# Patient Record
Sex: Male | Born: 1955
Health system: Southern US, Community
[De-identification: ages and names within clinical notes are randomized; demographics above are authoritative.]

## PROBLEM LIST (undated history)

## (undated) DIAGNOSIS — E7800 Pure hypercholesterolemia, unspecified: Secondary | ICD-10-CM

## (undated) DIAGNOSIS — D126 Benign neoplasm of colon, unspecified: Secondary | ICD-10-CM

## (undated) DIAGNOSIS — I639 Cerebral infarction, unspecified: Secondary | ICD-10-CM

## (undated) DIAGNOSIS — R413 Other amnesia: Secondary | ICD-10-CM

## (undated) DIAGNOSIS — I10 Essential (primary) hypertension: Secondary | ICD-10-CM

## (undated) HISTORY — DX: Essential (primary) hypertension: I10

## (undated) HISTORY — PX: COLONOSCOPY: SHX174

## (undated) HISTORY — DX: Benign neoplasm of colon, unspecified: D12.6

## (undated) HISTORY — DX: Cerebral infarction, unspecified: I63.9

## (undated) HISTORY — DX: Other amnesia: R41.3

## (undated) HISTORY — PX: POLYPECTOMY: SHX149

---

## 2002-12-10 HISTORY — PX: HERNIA REPAIR: SHX51

## 2003-03-16 ENCOUNTER — Ambulatory Visit (HOSPITAL_BASED_OUTPATIENT_CLINIC_OR_DEPARTMENT_OTHER): Admission: RE | Admit: 2003-03-16 | Discharge: 2003-03-16 | Payer: Self-pay

## 2011-04-06 ENCOUNTER — Ambulatory Visit: Payer: Self-pay | Admitting: Family

## 2014-01-11 ENCOUNTER — Ambulatory Visit: Payer: Self-pay | Admitting: Family Medicine

## 2014-01-18 ENCOUNTER — Encounter: Payer: Self-pay | Admitting: Family Medicine

## 2014-01-18 ENCOUNTER — Ambulatory Visit (INDEPENDENT_AMBULATORY_CARE_PROVIDER_SITE_OTHER): Payer: 59 | Admitting: Family Medicine

## 2014-01-18 VITALS — BP 181/109 | HR 76 | Temp 98.0°F | Ht 68.0 in | Wt 159.2 lb

## 2014-01-18 DIAGNOSIS — I10 Essential (primary) hypertension: Secondary | ICD-10-CM

## 2014-01-18 LAB — POCT URINALYSIS DIPSTICK
Bilirubin, UA: NEGATIVE
Blood, UA: NEGATIVE
Glucose, UA: NEGATIVE
Ketones, UA: NEGATIVE
Leukocytes, UA: NEGATIVE
Nitrite, UA: NEGATIVE
PH UA: 8.5
Protein, UA: NEGATIVE
Urobilinogen, UA: 0.2

## 2014-01-18 LAB — CBC WITH DIFFERENTIAL/PLATELET
BASOS ABS: 0 10*3/uL (ref 0.0–0.1)
Basophils Relative: 0.5 % (ref 0.0–3.0)
EOS ABS: 0.1 10*3/uL (ref 0.0–0.7)
Eosinophils Relative: 1.1 % (ref 0.0–5.0)
HCT: 49.4 % — ABNORMAL HIGH (ref 36.0–46.0)
Hemoglobin: 16.4 g/dL — ABNORMAL HIGH (ref 12.0–15.0)
LYMPHS ABS: 1.3 10*3/uL (ref 0.7–4.0)
LYMPHS PCT: 16.4 % (ref 12.0–46.0)
MCHC: 33.2 g/dL (ref 30.0–36.0)
MCV: 95.8 fl (ref 78.0–100.0)
Monocytes Absolute: 0.4 10*3/uL (ref 0.1–1.0)
Monocytes Relative: 5.3 % (ref 3.0–12.0)
Neutro Abs: 6.2 10*3/uL (ref 1.4–7.7)
Neutrophils Relative %: 76.7 % (ref 43.0–77.0)
Platelets: 222 10*3/uL (ref 150.0–400.0)
RBC: 5.15 Mil/uL — ABNORMAL HIGH (ref 3.87–5.11)
RDW: 12.3 % (ref 11.5–14.6)
WBC: 8.1 10*3/uL (ref 4.5–10.5)

## 2014-01-18 LAB — HEPATIC FUNCTION PANEL
ALBUMIN: 4 g/dL (ref 3.5–5.2)
ALK PHOS: 73 U/L (ref 39–117)
ALT: 12 U/L (ref 0–35)
AST: 17 U/L (ref 0–37)
Bilirubin, Direct: 0.1 mg/dL (ref 0.0–0.3)
Total Bilirubin: 0.7 mg/dL (ref 0.3–1.2)
Total Protein: 6.9 g/dL (ref 6.0–8.3)

## 2014-01-18 LAB — BASIC METABOLIC PANEL
BUN: 7 mg/dL (ref 6–23)
CALCIUM: 9.7 mg/dL (ref 8.4–10.5)
CO2: 26 meq/L (ref 19–32)
Chloride: 108 mEq/L (ref 96–112)
Creatinine, Ser: 0.9 mg/dL (ref 0.4–1.2)
GFR: 66.48 mL/min (ref >60.00)
GLUCOSE: 101 mg/dL — AB (ref 70–99)
Potassium: 4.3 mEq/L (ref 3.5–5.1)
Sodium: 145 mEq/L (ref 135–145)

## 2014-01-18 LAB — PSA: PSA: 1.03 ng/mL (ref 0.10–4.00)

## 2014-01-18 LAB — LIPID PANEL
CHOLESTEROL: 187 mg/dL (ref 0–200)
HDL: 40 mg/dL (ref >39.00)
LDL Cholesterol: 131 mg/dL — ABNORMAL HIGH (ref 0–99)
Total CHOL/HDL Ratio: 5
Triglycerides: 82 mg/dL (ref 0.0–149.0)
VLDL: 16.4 mg/dL (ref 0.0–40.0)

## 2014-01-18 LAB — TSH: TSH: 1.22 u[IU]/mL (ref 0.35–5.50)

## 2014-01-18 MED ORDER — LISINOPRIL 20 MG PO TABS: 20.0000 mg | ORAL_TABLET | Freq: Every day | ORAL | Status: DC

## 2014-01-18 NOTE — Patient Instructions (Signed)

## 2014-01-18 NOTE — Progress Notes (Signed)
Pre visit review using our clinic review tool, if applicable. No additional management support is needed unless otherwise documented below in the visit note. 

## 2014-01-18 NOTE — Progress Notes (Signed)
  Subjective:    Patient here for follow-up of elevated blood pressure.  She is not exercising and is adherent to a low-salt diet.  Blood pressure is not well controlled at home. Cardiac symptoms: none. Patient denies: chest pain, chest pressure/discomfort, claudication, dyspnea, exertional chest pressure/discomfort, fatigue, irregular heart beat, lower extremity edema, near-syncope, orthopnea, palpitations, paroxysmal nocturnal dyspnea, syncope and tachypnea. Cardiovascular risk factors: family history of premature cardiovascular disease, hypertension, male gender and sedentary lifestyle. Use of agents associated with hypertension: none. History of target organ damage: none.  The following portions of the patient's history were reviewed and updated as appropriate:  She  has a past medical history of Hypertension. She  does not have any pertinent problems on file. She  has past surgical history that includes Hernia repair (2004). Her family history includes Graves' disease in her mother; Heart disease in her father, mother, paternal uncle, paternal uncle, paternal uncle, paternal uncle, paternal uncle, and paternal uncle; Hyperlipidemia in her brother; Hypertension in her brother and brother. She  has no tobacco, alcohol, and drug history on file. She currently has no medications in their medication list. No current outpatient prescriptions on file prior to visit.   No current facility-administered medications on file prior to visit.   She has No Known Allergies..  Review of Systems Pertinent items are noted in HPI.     Objective:    BP 181/109  Pulse 76  Temp(Src) 98 F (36.7 C) (Oral)  Ht 5\' 8"  (1.727 m)  Wt 159 lb 3.2 oz (72.213 kg)  BMI 24.21 kg/m2  SpO2 96% General appearance: alert, cooperative, appears stated age and no distress Throat: lips, mucosa, and tongue normal; teeth and gums normal Neck: no adenopathy, no carotid bruit, no JVD, supple, symmetrical, trachea midline and  thyroid not enlarged, symmetric, no tenderness/mass/nodules Lungs: clear to auscultation bilaterally Heart: regular rate and rhythm, S1, S2 normal, no murmur, click, rub or gallop Extremities: extremities normal, atraumatic, no cyanosis or edema   EKG-- NSR  Assessment:    Hypertension, stage 2 uncontrolled. Evidence of target organ damage: none.    Plan:    Medication: begin lisinopril. Screening labs for initial evaluation: basic metabolic panel, blood sugar, creatinine, lipid panel, potassium and urinalysis. Dietary sodium restriction. Regular aerobic exercise. Check blood pressures 2-3 times weekly and record. Follow up: 2 weeks and as needed.

## 2014-01-19 ENCOUNTER — Telehealth: Payer: Self-pay | Admitting: Family Medicine

## 2014-01-19 NOTE — Telephone Encounter (Signed)
Relevant patient education assigned to patient using Emmi. ° °

## 2014-02-01 ENCOUNTER — Encounter: Payer: Self-pay | Admitting: Family Medicine

## 2014-02-01 ENCOUNTER — Ambulatory Visit (INDEPENDENT_AMBULATORY_CARE_PROVIDER_SITE_OTHER): Payer: 59 | Admitting: Family Medicine

## 2014-02-01 VITALS — BP 156/88 | HR 85 | Temp 98.4°F | Wt 158.0 lb

## 2014-02-01 DIAGNOSIS — I10 Essential (primary) hypertension: Secondary | ICD-10-CM

## 2014-02-01 MED ORDER — LISINOPRIL-HYDROCHLOROTHIAZIDE 20-12.5 MG PO TABS: 1.0000 | ORAL_TABLET | Freq: Every day | ORAL | Status: DC

## 2014-02-01 NOTE — Progress Notes (Signed)
  Subjective:    Patient here for follow-up of elevated blood pressure.  He is exercising and is adherent to a low-salt diet.  Blood pressure is not well controlled at home. Cardiac symptoms: none. Patient denies: chest pain, chest pressure/discomfort, claudication, dyspnea, exertional chest pressure/discomfort, fatigue, irregular heart beat, lower extremity edema, near-syncope, orthopnea, palpitations, paroxysmal nocturnal dyspnea, syncope and tachypnea. Cardiovascular risk factors: dyslipidemia, hypertension and male gender. Use of agents associated with hypertension: none. History of target organ damage: none.  The following portions of the patient's history were reviewed and updated as appropriate: allergies, current medications, past family history, past medical history, past social history, past surgical history and problem list.  Review of Systems Pertinent items are noted in HPI.     Objective:    BP 156/88  Pulse 85  Temp(Src) 98.4 F (36.9 C) (Oral)  Wt 158 lb (71.668 kg)  SpO2 96% General appearance: alert, cooperative, appears stated age and no distress Lungs: clear to auscultation bilaterally Heart: S1, S2 normal Extremities: extremities normal, atraumatic, no cyanosis or edema    Assessment:    Hypertension, stage 1 . Evidence of target organ damage: none.    Plan:    Medication: increase to lisinopril hct. Dietary sodium restriction. Regular aerobic exercise. Check blood pressures 2-3 times weekly and record. Follow up: 3 weeks and as needed.

## 2014-02-01 NOTE — Patient Instructions (Signed)

## 2014-03-02 ENCOUNTER — Ambulatory Visit: Payer: Self-pay | Admitting: Family Medicine

## 2014-03-04 ENCOUNTER — Telehealth: Payer: Self-pay

## 2014-03-04 NOTE — Telephone Encounter (Signed)
Left message for call back Non-identifiable   Flu-declined [facility] Tdap- 05/21/12 PSA- 01/18/14 1.03

## 2014-03-05 ENCOUNTER — Ambulatory Visit (INDEPENDENT_AMBULATORY_CARE_PROVIDER_SITE_OTHER): Payer: 59 | Admitting: Family Medicine

## 2014-03-05 ENCOUNTER — Encounter: Payer: Self-pay | Admitting: Family Medicine

## 2014-03-05 VITALS — BP 150/86 | HR 74 | Temp 98.4°F | Ht 68.0 in | Wt 154.0 lb

## 2014-03-05 DIAGNOSIS — D485 Neoplasm of uncertain behavior of skin: Secondary | ICD-10-CM

## 2014-03-05 DIAGNOSIS — I10 Essential (primary) hypertension: Secondary | ICD-10-CM

## 2014-03-05 DIAGNOSIS — D229 Melanocytic nevi, unspecified: Secondary | ICD-10-CM

## 2014-03-05 DIAGNOSIS — Z0000 Encounter for general adult medical examination without abnormal findings: Secondary | ICD-10-CM

## 2014-03-05 NOTE — Telephone Encounter (Signed)
Medication List and allergies:  Updated and Reviewed  90 day supply/mail order: n/a Local prescriptions:  [facility] [place], [place].  Immunization due:  Influenza-declined  A/P: No changes to personal, family or PSH Flu-declined Tdap- 05/21/12 [facility]- has never had a colonoscopy PSA- 01/18/14- 1.03   To discuss with provider: Nothing at this time.

## 2014-03-05 NOTE — Patient Instructions (Addendum)
Preventive Care for Adults, Male A healthy lifestyle and preventive care can promote health and wellness. Preventive health guidelines for men include the following key practices:  A routine yearly physical is a good way to check with your health care provider about your health and preventative screening. It is a chance to share any concerns and updates on your health and to receive a thorough exam.  Visit your dentist for a routine exam and preventative care every 6 months. Brush your teeth twice a day and floss once a day. Good oral hygiene prevents tooth decay and gum disease.  The frequency of eye exams is based on your age, health, family medical history, use of contact lenses, and other factors. Follow your health care provider's recommendations for frequency of eye exams.  Eat a healthy diet. Foods such as vegetables, fruits, whole grains, low-fat dairy products, and lean protein foods contain the nutrients you need without too many calories. Decrease your intake of foods high in solid fats, added sugars, and salt. Eat the right amount of calories for you.Get information about a proper diet from your health care provider, if necessary.  Regular physical exercise is one of the most important things you can do for your health. Most adults should get at least 150 minutes of moderate-intensity exercise (any activity that increases your heart rate and causes you to sweat) each week. In addition, most adults need muscle-strengthening exercises on 2 or more days a week.  Maintain a healthy weight. The body mass index (BMI) is a screening tool to identify possible weight problems. It provides an estimate of body fat based on height and weight. Your health care provider can find your BMI and can help you achieve or maintain a healthy weight.For adults 20 years and older:  A BMI below 18.5 is considered underweight.  A BMI of 18.5 to 24.9 is normal.  A BMI of 25 to 29.9 is considered  overweight.  A BMI of 30 and above is considered obese.  Maintain normal blood lipids and cholesterol levels by exercising and minimizing your intake of saturated fat. Eat a balanced diet with plenty of fruit and vegetables. Blood tests for lipids and cholesterol should begin at age 42 and be repeated every 5 years. If your lipid or cholesterol levels are high, you are over 50, or you are at high risk for heart disease, you may need your cholesterol levels checked more frequently.Ongoing high lipid and cholesterol levels should be treated with medicines if diet and exercise are not working.  If you smoke, find out from your health care provider how to quit. If you do not use tobacco, do not start.  Lung cancer screening is recommended for adults aged 24 80 years who are at high risk for developing lung cancer because of a history of smoking. A yearly low-dose CT scan of the lungs is recommended for people who have at least a 30-pack-year history of smoking and are a current smoker or have quit within the past 15 years. A pack year of smoking is smoking an average of 1 pack of cigarettes a day for 1 year (for example: 1 pack a day for 30 years or 2 packs a day for 15 years). Yearly screening should continue until the smoker has stopped smoking for at least 15 years. Yearly screening should be stopped for people who develop a health problem that would prevent them from having lung cancer treatment.  If you choose to drink alcohol, do not have  more than 2 drinks per day. One drink is considered to be 12 ounces (355 mL) of beer, 5 ounces (148 mL) of wine, or 1.5 ounces (44 mL) of liquor.  Avoid use of street drugs. Do not share needles with anyone. Ask for help if you need support or instructions about stopping the use of drugs.  High blood pressure causes heart disease and increases the risk of stroke. Your blood pressure should be checked at least every 1 2 years. Ongoing high blood pressure should be  treated with medicines, if weight loss and exercise are not effective.  If you are 75 59 years old, ask your health care provider if you should take aspirin to prevent heart disease.  Diabetes screening involves taking a blood sample to check your fasting blood sugar level. This should be done once every 3 years, after age 19, if you are within normal weight and without risk factors for diabetes. Testing should be considered at a younger age or be carried out more frequently if you are overweight and have at least 1 risk factor for diabetes.  Colorectal cancer can be detected and often prevented. Most routine colorectal cancer screening begins at the age of 47 and continues through age 80. However, your health care provider may recommend screening at an earlier age if you have risk factors for colon cancer. On a yearly basis, your health care provider may provide home test kits to check for hidden blood in the stool. Use of a small camera at the end of a tube to directly examine the colon (sigmoidoscopy or colonoscopy) can detect the earliest forms of colorectal cancer. Talk to your health care provider about this at age 66, when routine screening begins. Direct exam of the colon should be repeated every 5 10 years through age 19, unless early forms of precancerous polyps or small growths are found.  People who are at an increased risk for hepatitis B should be screened for this virus. You are considered at high risk for hepatitis B if:  You were born in a country where hepatitis B occurs often. Talk with your health care provider about which countries are considered high-risk.  Your parents were born in a high-risk country and you have not received a shot to protect against hepatitis B (hepatitis B vaccine).  You have HIV or AIDS.  You use needles to inject street drugs.  You live with, or have sex with, someone who has hepatitis B.  You are a man who has sex with other men (MSM).  You get  hemodialysis treatment.  You take certain medicines for conditions such as cancer, organ transplantation, and autoimmune conditions.  Hepatitis C blood testing is recommended for all people born from [birth year] through 1965 and any individual with known risks for hepatitis C.  Practice safe sex. Use condoms and avoid high-risk sexual practices to reduce the spread of sexually transmitted infections (STIs). STIs include gonorrhea, chlamydia, syphilis, trichomonas, herpes, HPV, and human immunodeficiency virus (HIV). Herpes, HIV, and HPV are viral illnesses that have no cure. They can result in disability, cancer, and death.  A one-time screening for abdominal aortic aneurysm (AAA) and surgical repair of large AAAs by ultrasound are recommended for men ages 94 to 74 years who are current or former smokers.  Healthy men should no longer receive prostate-specific antigen (PSA) blood tests as part of routine cancer screening. Talk with your health care provider about prostate cancer screening.  Testicular cancer screening is not recommended  for adult males who have no symptoms. Screening includes self-exam, a health care provider exam, and other screening tests. Consult with your health care provider about any symptoms you have or any concerns you have about testicular cancer.  Use sunscreen. Apply sunscreen liberally and repeatedly throughout the day. You should seek shade when your shadow is shorter than you. Protect yourself by wearing long sleeves, pants, a wide-brimmed hat, and sunglasses year round, whenever you are outdoors.  Once a month, do a whole-body skin exam, using a mirror to look at the skin on your back. Tell your health care provider about new moles, moles that have irregular borders, moles that are larger than a pencil eraser, or moles that have changed in shape or color.  Stay current with required vaccines (immunizations).  Influenza vaccine. All adults should be immunized every  year.  Tetanus, diphtheria, and acellular pertussis (Td, Tdap) vaccine. An adult who has not previously received Tdap or who does not know his vaccine status should receive 1 dose of Tdap. This initial dose should be followed by tetanus and diphtheria toxoids (Td) booster doses every 10 years. Adults with an unknown or incomplete history of completing a 3-dose immunization series with Td-containing vaccines should begin or complete a primary immunization series including a Tdap dose. Adults should receive a Td booster every 10 years.  Varicella vaccine. An adult without evidence of immunity to varicella should receive 2 doses or a second dose if he has previously received 1 dose.  Human papillomavirus (HPV) vaccine. Males aged 44 21 years who have not received the vaccine previously should receive the 3-dose series. Males aged 43 26 years may be immunized. Immunization is recommended through the age of 50 years for any male who has sex with males and did not get any or all doses earlier. Immunization is recommended for any person with an immunocompromised condition through the age of 23 years if he did not get any or all doses earlier. During the 3-dose series, the second dose should be obtained 4 8 weeks after the first dose. The third dose should be obtained 24 weeks after the first dose and 16 weeks after the second dose.  Zoster vaccine. One dose is recommended for adults aged 96 years or older unless certain conditions are present.  Measles, mumps, and rubella (MMR) vaccine. Adults born before 55 generally are considered immune to measles and mumps. Adults born in [birth year] or later should have 1 or more doses of MMR vaccine unless there is a contraindication to the vaccine or there is laboratory evidence of immunity to each of the three diseases. A routine second dose of MMR vaccine should be obtained at least 28 days after the first dose for students attending postsecondary schools, health care  workers, or international travelers. People who received inactivated measles vaccine or an unknown type of measles vaccine during 1963 1967 should receive 2 doses of MMR vaccine. People who received inactivated mumps vaccine or an unknown type of mumps vaccine before 1979 and are at high risk for mumps infection should consider immunization with 2 doses of MMR vaccine. Unvaccinated health care workers born before [birth year] who lack laboratory evidence of measles, mumps, or rubella immunity or laboratory confirmation of disease should consider measles and mumps immunization with 2 doses of MMR vaccine or rubella immunization with 1 dose of MMR vaccine.  Pneumococcal 13-valent conjugate (PCV13) vaccine. When indicated, a person who is uncertain of his immunization history and has no record of immunization  should receive the PCV13 vaccine. An adult aged 67 years or older who has certain medical conditions and has not been previously immunized should receive 1 dose of PCV13 vaccine. This PCV13 should be followed with a dose of pneumococcal polysaccharide (PPSV23) vaccine. The PPSV23 vaccine dose should be obtained at least 8 weeks after the dose of PCV13 vaccine. An adult aged 79 years or older who has certain medical conditions and previously received 1 or more doses of PPSV23 vaccine should receive 1 dose of PCV13. The PCV13 vaccine dose should be obtained 1 or more years after the last PPSV23 vaccine dose.  Pneumococcal polysaccharide (PPSV23) vaccine. When PCV13 is also indicated, PCV13 should be obtained first. All adults aged 74 years and older should be immunized. An adult younger than age 50 years who has certain medical conditions should be immunized. Any person who resides in a nursing home or long-term care facility should be immunized. An adult smoker should be immunized. People with an immunocompromised condition and certain other conditions should receive both PCV13 and PPSV23 vaccines. People with human  immunodeficiency virus (HIV) infection should be immunized as soon as possible after diagnosis. Immunization during chemotherapy or radiation therapy should be avoided. Routine use of PPSV23 vaccine is not recommended for American Indians, [place] Natives, or people younger than 65 years unless there are medical conditions that require PPSV23 vaccine. When indicated, people who have unknown immunization and have no record of immunization should receive PPSV23 vaccine. One-time revaccination 5 years after the first dose of PPSV23 is recommended for people aged 41 64 years who have chronic kidney failure, nephrotic syndrome, asplenia, or immunocompromised conditions. People who received 1 2 doses of PPSV23 before age 15 years should receive another dose of PPSV23 vaccine at age 48 years or later if at least 5 years have passed since the previous dose. Doses of PPSV23 are not needed for people immunized with PPSV23 at or after age 69 years.  Meningococcal vaccine. Adults with asplenia or persistent complement component deficiencies should receive 2 doses of quadrivalent meningococcal conjugate (MenACWY-D) vaccine. The doses should be obtained at least 2 months apart. Microbiologists working with certain meningococcal bacteria, [place] recruits, people at risk during an outbreak, and people who travel to or live in countries with a high rate of meningitis should be immunized. A first-year college student up through age 7 years who is living in a residence hall should receive a dose if he did not receive a dose on or after his 16th birthday. Adults who have certain high-risk conditions should receive one or more doses of vaccine.  Hepatitis A vaccine. Adults who wish to be protected from this disease, have certain high-risk conditions, work with hepatitis A-infected animals, work in hepatitis A research labs, or travel to or work in countries with a high rate of hepatitis A should be immunized. Adults who were  previously unvaccinated and who anticipate close contact with an international adoptee during the first 60 days after arrival in the [place] from a country with a high rate of hepatitis A should be immunized.  Hepatitis B vaccine. Adults who wish to be protected from this disease, have certain high-risk conditions, may be exposed to blood or other infectious body fluids, are household contacts or sex partners of hepatitis B positive people, are clients or workers in certain care facilities, or travel to or work in countries with a high rate of hepatitis B should be immunized.  Haemophilus influenzae type b (Hib) vaccine. A  previously unvaccinated person with asplenia or sickle cell disease or having a scheduled splenectomy should receive 1 dose of Hib vaccine. Regardless of previous immunization, a recipient of a hematopoietic stem cell transplant should receive a 3-dose series 6 12 months after his successful transplant. Hib vaccine is not recommended for adults with HIV infection. Preventive Service / Frequency Ages 62 to 3  Blood pressure check.** / Every 1 to 2 years.  Lipid and cholesterol check.** / Every 5 years beginning at age 43.  Hepatitis C blood test.** / For any individual with known risks for hepatitis C.  Skin self-exam. / Monthly.  Influenza vaccine. / Every year.  Tetanus, diphtheria, and acellular pertussis (Tdap, Td) vaccine.** / Consult your health care provider. 1 dose of Td every 10 years.  Varicella vaccine.** / Consult your health care provider.  HPV vaccine. / 3 doses over 6 months, if 48 or younger.  Measles, mumps, rubella (MMR) vaccine.** / You need at least 1 dose of MMR if you were born in 1957 or later. You may also need a second dose.  Pneumococcal 13-valent conjugate (PCV13) vaccine.** / Consult your health care provider.  Pneumococcal polysaccharide (PPSV23) vaccine.** / 1 to 2 doses if you smoke cigarettes or if you have certain  conditions.  Meningococcal vaccine.** / 1 dose if you are age 8 to 70 years and a Market researcher living in a residence hall, or have one of several medical conditions. You may also need additional booster doses.  Hepatitis A vaccine.** / Consult your health care provider.  Hepatitis B vaccine.** / Consult your health care provider.  Haemophilus influenzae type b (Hib) vaccine.** / Consult your health care provider. Ages 48 to 32  Blood pressure check.** / Every 1 to 2 years.  Lipid and cholesterol check.** / Every 5 years beginning at age 38.  Lung cancer screening. / Every year if you are aged 40 80 years and have a 30-pack-year history of smoking and currently smoke or have quit within the past 15 years. Yearly screening is stopped once you have quit smoking for at least 15 years or develop a health problem that would prevent you from having lung cancer treatment.  Fecal occult blood test (FOBT) of stool. / Every year beginning at age 4 and continuing until age 70. You may not have to do this test if you get a colonoscopy every 10 years.  Flexible sigmoidoscopy** or colonoscopy.** / Every 5 years for a flexible sigmoidoscopy or every 10 years for a colonoscopy beginning at age 76 and continuing until age 62.  Hepatitis C blood test.** / For all people born from [birth year] through 1965 and any individual with known risks for hepatitis C.  Skin self-exam. / Monthly.  Influenza vaccine. / Every year.  Tetanus, diphtheria, and acellular pertussis (Tdap/Td) vaccine.** / Consult your health care provider. 1 dose of Td every 10 years.  Varicella vaccine.** / Consult your health care provider.  Zoster vaccine.** / 1 dose for adults aged 60 years or older.  Measles, mumps, rubella (MMR) vaccine.** / You need at least 1 dose of MMR if you were born in 1958 or later. You may also need a second dose.  Pneumococcal 13-valent conjugate (PCV13) vaccine.** / Consult your health care  provider.  Pneumococcal polysaccharide (PPSV23) vaccine.** / 1 to 2 doses if you smoke cigarettes or if you have certain conditions.  Meningococcal vaccine.** / Consult your health care provider.  Hepatitis A vaccine.** / Consult your health care  provider.  Hepatitis B vaccine.** / Consult your health care provider.  Haemophilus influenzae type b (Hib) vaccine.** / Consult your health care provider. Ages 65 and over  Blood pressure check.** / Every 1 to 2 years.  Lipid and cholesterol check.**/ Every 5 years beginning at age 20.  Lung cancer screening. / Every year if you are aged 55 80 years and have a 30-pack-year history of smoking and currently smoke or have quit within the past 15 years. Yearly screening is stopped once you have quit smoking for at least 15 years or develop a health problem that would prevent you from having lung cancer treatment.  Fecal occult blood test (FOBT) of stool. / Every year beginning at age 50 and continuing until age 75. You may not have to do this test if you get a colonoscopy every 10 years.  Flexible sigmoidoscopy** or colonoscopy.** / Every 5 years for a flexible sigmoidoscopy or every 10 years for a colonoscopy beginning at age 50 and continuing until age 75.  Hepatitis C blood test.** / For all people born from 1945 through 1965 and any individual with known risks for hepatitis C.  Abdominal aortic aneurysm (AAA) screening.** / A one-time screening for ages 65 to 75 years who are current or former smokers.  Skin self-exam. / Monthly.  Influenza vaccine. / Every year.  Tetanus, diphtheria, and acellular pertussis (Tdap/Td) vaccine.** / 1 dose of Td every 10 years.  Varicella vaccine.** / Consult your health care provider.  Zoster vaccine.** / 1 dose for adults aged 60 years or older.  Pneumococcal 13-valent conjugate (PCV13) vaccine.** / Consult your health care provider.  Pneumococcal polysaccharide (PPSV23) vaccine.** / 1 dose for all  adults aged 65 years and older.  Meningococcal vaccine.** / Consult your health care provider.  Hepatitis A vaccine.** / Consult your health care provider.  Hepatitis B vaccine.** / Consult your health care provider.  Haemophilus influenzae type b (Hib) vaccine.** / Consult your health care provider. **Family history and personal history of risk and conditions may change your health care provider's recommendations. Document Released: 01/22/2002 Document Revised: 09/16/2013 Document Reviewed: 04/23/2011 [employer] Patient Information 2014 [employer].  

## 2014-03-05 NOTE — Progress Notes (Signed)
Subjective:    Patient ID: Drew [name], male    DOB: 1956-03-23, 58 y.o.   MRN: [id number]  HPI Pt here for cpe and labs.  Pt is exercising more.  No complaints.      Past Medical History  Diagnosis Date  . Hypertension    History   Social History  . Marital Status: Married    Spouse Name: N/A    Number of Children: N/A  . Years of Education: N/A   Occupational History  . Not on file.   Social History Main Topics  . Smoking status: Former Smoker -- 1.00 packs/day for 15 years    Types: Cigarettes    Quit date: 01/19/1988  . Smokeless tobacco: Current User    Types: Chew  . Alcohol Use: No  . Drug Use: Not on file  . Sexual Activity: Yes   Other Topics Concern  . Not on file   Social History Narrative  . No narrative on file   Family History  Problem Relation Age of Onset  . Heart disease Mother     Duanne Limerick  . Berenice Primas' disease Mother   . Heart disease Father   . Hyperlipidemia Brother   . Hypertension Brother   . Heart disease Paternal Uncle   . Heart disease Paternal Uncle   . Heart disease Paternal Uncle   . Heart disease Paternal Uncle   . Heart disease Paternal Uncle   . Heart disease Paternal Uncle   . Hypertension Brother    Current Outpatient Prescriptions  Medication Sig Dispense Refill  . lisinopril-hydrochlorothiazide (ZESTORETIC) 20-12.5 MG per tablet Take 1 tablet by mouth daily.  30 tablet  2   No current facility-administered medications for this visit.       Review of Systems  Constitutional: Negative.   HENT: Negative for congestion, ear pain, hearing loss, nosebleeds, postnasal drip, rhinorrhea, sinus pressure, sneezing and tinnitus.   Eyes: Negative for photophobia, discharge, itching and visual disturbance.  Respiratory: Negative.   Cardiovascular: Negative.   Gastrointestinal: Negative for abdominal pain, constipation, blood in stool, abdominal distention and anal bleeding.  Endocrine: Negative.   Genitourinary: Negative.     Musculoskeletal: Negative.   Skin: Negative.   Allergic/Immunologic: Negative.   Neurological: Negative for dizziness, weakness, light-headedness, numbness and headaches.  Psychiatric/Behavioral: Negative for suicidal ideas, confusion, sleep disturbance, dysphoric mood, decreased concentration and agitation. The patient is not nervous/anxious.       .vitals 9  Objective:   Physical Exam  Constitutional: He is oriented to person, place, and time. He appears well-developed and well-nourished. No distress.  HENT:  Head: Normocephalic and atraumatic.  Right Ear: External ear normal.  Left Ear: External ear normal.  Nose: Nose normal.  Mouth/Throat: Oropharynx is clear and moist. No oropharyngeal exudate.  Eyes: Conjunctivae and EOM are normal. Pupils are equal, round, and reactive to light. Right eye exhibits no discharge. Left eye exhibits no discharge.  Neck: Normal range of motion. Neck supple. No JVD present. No thyromegaly present.  Cardiovascular: Normal rate, regular rhythm and intact distal pulses.  Exam reveals no gallop and no friction rub.   No murmur heard. Pulmonary/Chest: Effort normal and breath sounds normal. No respiratory distress. He has no wheezes. He has no rales. He exhibits no tenderness.  Abdominal: Soft. Bowel sounds are normal. He exhibits no distension and no mass. There is no tenderness. There is no rebound and no guarding.  Genitourinary: Rectum normal, prostate normal and penis normal. Guaiac negative  stool.  Musculoskeletal: Normal range of motion. He exhibits no edema and no tenderness.  Lymphadenopathy:    He has no cervical adenopathy.  Neurological: He is alert and oriented to person, place, and time. He displays normal reflexes. He exhibits normal muscle tone.  Skin: Skin is warm and dry. No rash noted. He is not diaphoretic. No erythema. No pallor.  Psychiatric: He has a normal mood and affect. His behavior is normal. Judgment and thought content  normal.          Assessment & Plan:  1. Preventative health care ghm utd Check labs - Ambulatory referral to Gastroenterology - Basic metabolic panel; Future - Hepatic function panel; Future - Hemoglobin A1c; Future - Lipid panel; Future  2. Suspicious nevus On L temple - Ambulatory referral to Dermatology

## 2014-03-05 NOTE — Progress Notes (Signed)
Pre visit review using our clinic review tool, if applicable. No additional management support is needed unless otherwise documented below in the visit note. 

## 2014-03-06 NOTE — Assessment & Plan Note (Signed)
con't meds 

## 2014-03-08 ENCOUNTER — Encounter: Payer: Self-pay | Admitting: Internal Medicine

## 2014-03-08 ENCOUNTER — Telehealth: Payer: Self-pay | Admitting: Family Medicine

## 2014-03-08 NOTE — Telephone Encounter (Signed)
Error. BC °

## 2014-04-22 ENCOUNTER — Ambulatory Visit (AMBULATORY_SURGERY_CENTER): Payer: Self-pay | Admitting: *Deleted

## 2014-04-22 VITALS — Ht 66.0 in | Wt 152.2 lb

## 2014-04-22 DIAGNOSIS — Z1211 Encounter for screening for malignant neoplasm of colon: Secondary | ICD-10-CM

## 2014-04-22 MED ORDER — MOVIPREP 100 G PO SOLR: ORAL | Status: DC

## 2014-04-22 NOTE — Progress Notes (Signed)
No allergies to eggs or soy. No problems with anesthesia.  Pt given Emmi instructions for colonoscopy  No oxygen use  No diet drug use  

## 2014-04-27 ENCOUNTER — Other Ambulatory Visit: Payer: Self-pay | Admitting: Family Medicine

## 2014-04-28 ENCOUNTER — Encounter: Payer: Self-pay | Admitting: Internal Medicine

## 2014-05-07 ENCOUNTER — Ambulatory Visit (AMBULATORY_SURGERY_CENTER): Payer: 59 | Admitting: Internal Medicine

## 2014-05-07 ENCOUNTER — Encounter: Payer: Self-pay | Admitting: Internal Medicine

## 2014-05-07 VITALS — BP 148/83 | HR 53 | Temp 97.2°F | Resp 20 | Ht 66.0 in | Wt 152.0 lb

## 2014-05-07 DIAGNOSIS — D126 Benign neoplasm of colon, unspecified: Secondary | ICD-10-CM

## 2014-05-07 DIAGNOSIS — Z1211 Encounter for screening for malignant neoplasm of colon: Secondary | ICD-10-CM

## 2014-05-07 MED ORDER — SODIUM CHLORIDE 0.9 % IV SOLN: 500.0000 mL | INTRAVENOUS | Status: DC

## 2014-05-07 NOTE — Progress Notes (Signed)
Called to room to assist during endoscopic procedure.  Patient ID and intended procedure confirmed with present staff. Received instructions for my participation in the procedure from the performing physician.  

## 2014-05-07 NOTE — Op Note (Signed)
[facility]  [address]. [place], [address]   COLONOSCOPY PROCEDURE REPORT  PATIENT: Drew [name], Drew [name].  MR#: [id number] BIRTHDATE: Feb 07, 1956 , 57  yrs. old GENDER: Male ENDOSCOPIST: Jerene Bears, MD REFERRED [id number] [place], DO PROCEDURE DATE:  05/07/2014 PROCEDURE:   Colonoscopy with snare polypectomy First Screening Colonoscopy - Avg.  risk and is 50 yrs.  old or older Yes.  Prior Negative Screening - Now for repeat screening. N/A  History of Adenoma - Now for follow-up colonoscopy & has been > or = to 3 yrs.  N/A  Polyps Removed Today? Yes. ASA CLASS:   Class II INDICATIONS:average risk screening and first colonoscopy. MEDICATIONS: MAC sedation, administered by CRNA and propofol (Diprivan) 200mg  IV  DESCRIPTION OF PROCEDURE:   After the risks benefits and alternatives of the procedure were thoroughly explained, informed consent was obtained.  A digital rectal exam revealed no rectal mass.   The LB [id number]  endoscope was introduced through the anus and advanced to the cecum, which was identified by both the appendix and ileocecal valve. No adverse events experienced. The quality of the prep was good, using MoviPrep  The instrument was then slowly withdrawn as the colon was fully examined.   COLON FINDINGS: Three sessile polyps measuring 3-6 mm in size were found in the ascending colon (2) and transverse colon.  Polypectomy was performed using cold snare.  All resections were complete and all polyp tissue was completely retrieved.   Mild diverticulosis was noted in the sigmoid colon.  Retroflexed views revealed no abnormalities. The time to cecum=5 minutes 58 seconds.  Withdrawal time=11 minutes 23 seconds.  The scope was withdrawn and the procedure completed. COMPLICATIONS: There were no complications.  ENDOSCOPIC IMPRESSION: 1.   Three sessile polyps measuring 3-6 mm in size were found in the ascending colon and transverse colon; Polypectomy  was performed using cold snare 2.   Mild diverticulosis was noted in the sigmoid colon  RECOMMENDATIONS: 1.  Await pathology results 2.  High fiber diet 3.  Timing of repeat colonoscopy will be determined by pathology findings. 4.  You will receive a letter within 1-2 weeks with the results of your biopsy as well as final recommendations.  Please call my office if you have not received a letter after 3 weeks.   eSigned:  Jerene Bears, MD 05/07/2014 9:35 AM cc: The Patient and Rosalita Chessman, DO

## 2014-05-07 NOTE — Progress Notes (Signed)
A/ox3 pleased with MAC, report to Kristin RN 

## 2014-05-07 NOTE — Patient Instructions (Signed)

## 2014-05-10 ENCOUNTER — Telehealth: Payer: Self-pay | Admitting: *Deleted

## 2014-05-10 NOTE — Telephone Encounter (Signed)
  Follow up Call-  Call back number 05/07/2014  Post procedure Call Back phone  # [phone]  Permission to leave phone message Yes     Patient questions:  Do you have a fever, pain , or abdominal swelling? no Pain Score  0 *  Have you tolerated food without any problems? yes  Have you been able to return to your normal activities? yes  Do you have any questions about your discharge instructions: Diet   no Medications  no Follow up visit  no  Do you have questions or concerns about your Care? no  Actions: * If pain score is 4 or above: No action needed, pain <4.

## 2014-05-13 ENCOUNTER — Encounter: Payer: Self-pay | Admitting: Internal Medicine

## 2014-05-28 ENCOUNTER — Encounter: Payer: Self-pay | Admitting: Family Medicine

## 2014-05-28 ENCOUNTER — Ambulatory Visit (INDEPENDENT_AMBULATORY_CARE_PROVIDER_SITE_OTHER): Payer: 59 | Admitting: Family Medicine

## 2014-05-28 VITALS — BP 140/86 | HR 69 | Temp 98.5°F | Wt 146.0 lb

## 2014-05-28 DIAGNOSIS — E785 Hyperlipidemia, unspecified: Secondary | ICD-10-CM

## 2014-05-28 DIAGNOSIS — Z0000 Encounter for general adult medical examination without abnormal findings: Secondary | ICD-10-CM

## 2014-05-28 DIAGNOSIS — I10 Essential (primary) hypertension: Secondary | ICD-10-CM

## 2014-05-28 LAB — HEPATIC FUNCTION PANEL
ALK PHOS: 61 U/L (ref 39–117)
ALT: 11 U/L (ref 0–53)
AST: 15 U/L (ref 0–37)
Albumin: 3.9 g/dL (ref 3.5–5.2)
Bilirubin, Direct: 0.1 mg/dL (ref 0.0–0.3)
TOTAL PROTEIN: 6.7 g/dL (ref 6.0–8.3)
Total Bilirubin: 0.8 mg/dL (ref 0.2–1.2)

## 2014-05-28 LAB — LIPID PANEL
CHOL/HDL RATIO: 4
Cholesterol: 180 mg/dL (ref 0–200)
HDL: 44.7 mg/dL (ref >39.00)
LDL Cholesterol: 124 mg/dL — ABNORMAL HIGH (ref 0–99)
NONHDL: 135.3
Triglycerides: 55 mg/dL (ref 0.0–149.0)
VLDL: 11 mg/dL (ref 0.0–40.0)

## 2014-05-28 LAB — BASIC METABOLIC PANEL
BUN: 6 mg/dL (ref 6–23)
CO2: 30 meq/L (ref 19–32)
Calcium: 9.4 mg/dL (ref 8.4–10.5)
Chloride: 103 mEq/L (ref 96–112)
Creatinine, Ser: 0.8 mg/dL (ref 0.4–1.5)
GFR: 99.39 mL/min (ref >60.00)
GLUCOSE: 90 mg/dL (ref 70–99)
POTASSIUM: 3.7 meq/L (ref 3.5–5.1)
SODIUM: 138 meq/L (ref 135–145)

## 2014-05-28 LAB — HEMOGLOBIN A1C: Hgb A1c MFr Bld: 5.5 % (ref 4.6–6.5)

## 2014-05-28 MED ORDER — LISINOPRIL-HYDROCHLOROTHIAZIDE 20-12.5 MG PO TABS: 2.0000 | ORAL_TABLET | Freq: Every day | ORAL | Status: DC

## 2014-05-28 NOTE — Addendum Note (Signed)
Addended by: Modena Morrow D on: 05/28/2014 09:52 AM   Modules accepted: Orders

## 2014-05-28 NOTE — Patient Instructions (Signed)
Hypertension Hypertension, commonly called high blood pressure, is when the force of blood pumping through your arteries is too strong. Your arteries are the blood vessels that carry blood from your heart throughout your body. A blood pressure reading consists of a higher number over a lower number, such as 110/72. The higher number (systolic) is the pressure inside your arteries when your heart pumps. The lower number (diastolic) is the pressure inside your arteries when your heart relaxes. Ideally you want your blood pressure below 120/80. Hypertension forces your heart to work harder to pump blood. Your arteries may become narrow or stiff. Having hypertension puts you at risk for heart disease, stroke, and other problems.  RISK FACTORS Some risk factors for high blood pressure are controllable. Others are not.  Risk factors you cannot control include:   Race. You may be at higher risk if you are African American.  Age. Risk increases with age.  Gender. Men are at higher risk than women before age 45 years. After age 65, women are at higher risk than men. Risk factors you can control include:  Not getting enough exercise or physical activity.  Being overweight.  Getting too much fat, sugar, calories, or salt in your diet.  Drinking too much alcohol. SIGNS AND SYMPTOMS Hypertension does not usually cause signs or symptoms. Extremely high blood pressure (hypertensive crisis) may cause headache, anxiety, shortness of breath, and nosebleed. DIAGNOSIS  To check if you have hypertension, your health care provider will measure your blood pressure while you are seated, with your arm held at the level of your heart. It should be measured at least twice using the same arm. Certain conditions can cause a difference in blood pressure between your right and left arms. A blood pressure reading that is higher than normal on one occasion does not mean that you need treatment. If one blood pressure reading  is high, ask your health care provider about having it checked again. TREATMENT  Treating high blood pressure includes making lifestyle changes and possibly taking medication. Living a healthy lifestyle can help lower high blood pressure. You may need to change some of your habits. Lifestyle changes may include:  Following the DASH diet. This diet is high in fruits, vegetables, and whole grains. It is low in salt, red meat, and added sugars.  Getting at least 2 1/2 hours of brisk physical activity every week.  Losing weight if necessary.  Not smoking.  Limiting alcoholic beverages.  Learning ways to reduce stress. If lifestyle changes are not enough to get your blood pressure under control, your health care provider may prescribe medicine. You may need to take more than one. Work closely with your health care provider to understand the risks and benefits. HOME CARE INSTRUCTIONS  Have your blood pressure rechecked as directed by your health care provider.   Only take medicine as directed by your health care provider. Follow the directions carefully. Blood pressure medicines must be taken as prescribed. The medicine does not work as well when you skip doses. Skipping doses also puts you at risk for problems.   Do not smoke.   Monitor your blood pressure at home as directed by your health care provider. SEEK MEDICAL CARE IF:   You think you are having a reaction to medicines taken.  You have recurrent headaches or feel dizzy.  You have swelling in your ankles.  You have trouble with your vision. SEEK IMMEDIATE MEDICAL CARE IF:  You develop a severe headache or   confusion.  You have unusual weakness, numbness, or feel faint.  You have severe chest or abdominal pain.  You vomit repeatedly.  You have trouble breathing. MAKE SURE YOU:   Understand these instructions.  Will watch your condition.  Will get help right away if you are not doing well or get  worse. Document Released: 11/26/2005 Document Revised: 12/01/2013 Document Reviewed: 09/18/2013 ExitCare Patient Information 2015 [employer]. This information is not intended to replace advice given to you by your health care provider. Make sure you discuss any questions you have with your health care provider.  

## 2014-05-28 NOTE — Progress Notes (Signed)
  Subjective:    Patient here for follow-up of elevated blood pressure.  He is exercising and is adherent to a low-salt diet.  Blood pressure is not well controlled at home. Cardiac symptoms: none. Patient denies: chest pain, chest pressure/discomfort, claudication, dyspnea, exertional chest pressure/discomfort, fatigue, irregular heart beat, lower extremity edema, near-syncope, orthopnea, palpitations, paroxysmal nocturnal dyspnea, syncope and tachypnea. Cardiovascular risk factors: advanced age (older than 52 for men, 46 for women), dyslipidemia, hypertension and male gender. Use of agents associated with hypertension: none. History of target organ damage: none.  The following portions of the patient's history were reviewed and updated as appropriate: allergies, current medications, past family history, past medical history, past social history, past surgical history and problem list.  Review of Systems Pertinent items are noted in HPI.     Objective:    BP 140/86  Pulse 69  Temp(Src) 98.5 F (36.9 C) (Oral)  Wt 146 lb (66.225 kg)  SpO2 97% General appearance: alert, cooperative, appears stated age and no distress Neck: no adenopathy, supple, symmetrical, trachea midline and thyroid not enlarged, symmetric, no tenderness/mass/nodules Lungs: clear to auscultation bilaterally Heart: S1, S2 normal Extremities: extremities normal, atraumatic, no cyanosis or edema    Assessment:    Hypertension, uncontrolled. Evidence of target organ damage: none.    Plan:    Medication: increase to lisinopril 20/12.5  2 po qd . Dietary sodium restriction. Regular aerobic exercise. Check blood pressures 2-3 times weekly and record. Follow up: 3 months and as needed.   1. Essential hypertension  - lisinopril-hydrochlorothiazide (ZESTORETIC) 20-12.5 MG per tablet; Take 2 tablets by mouth daily.  Dispense: 180 tablet; Refill: 3  2. Hyperlipidemia Check labs

## 2014-05-28 NOTE — Progress Notes (Signed)
Pre visit review using our clinic review tool, if applicable. No additional management support is needed unless otherwise documented below in the visit note. 

## 2014-06-01 ENCOUNTER — Other Ambulatory Visit: Payer: Self-pay

## 2014-06-01 MED ORDER — ATORVASTATIN CALCIUM 20 MG PO TABS: 20.0000 mg | ORAL_TABLET | Freq: Every day | ORAL | Status: DC

## 2014-06-04 ENCOUNTER — Telehealth: Payer: Self-pay | Admitting: Family Medicine

## 2014-06-04 NOTE — Telephone Encounter (Signed)
Caller name: Hilda Blades  Relation to pt: patient Call back number: [phone] Pharmacy:  Reason for call:  Wife called and stated that she does not know why Lipitor was called in for her husband. Wife thought his recent cholesterol results were good. Please advise.

## 2014-06-04 NOTE — Telephone Encounter (Signed)
Spoke with wife and advised the elevated LDL was the reason for the increase in medication. She voiced understanding and has agreed to have him start medications.     KP   Cholesterol--- LDL goal < 100, HDL >40, TG < 150. Diet and exercise will increase HDL and decrease LDL and TG. Fish, Fish Oil, Flaxseed oil will also help increase the HDL and decrease Triglycerides. Recheck labs in 3 months-----272.4 lipid, hep Lipitor 20 mg #30 1 po qhs

## 2014-09-07 ENCOUNTER — Telehealth: Payer: Self-pay | Admitting: Family Medicine

## 2014-09-07 ENCOUNTER — Other Ambulatory Visit: Payer: Self-pay

## 2014-09-07 DIAGNOSIS — E785 Hyperlipidemia, unspecified: Secondary | ICD-10-CM

## 2014-09-07 MED ORDER — ATORVASTATIN CALCIUM 20 MG PO TABS: 20.0000 mg | ORAL_TABLET | Freq: Every day | ORAL | Status: DC

## 2014-09-07 NOTE — Telephone Encounter (Signed)
Patient is due for labs. Orders are in, please schedule. Rx sent.     KP

## 2014-09-07 NOTE — Telephone Encounter (Signed)
Caller name: Hilda Blades Relation to pt: wife Call back [phone] Pharmacy: [facility]  Reason for call: pt needs refill on Rx atorvastatin (LIPITOR) 20 MG tablet ; pharmacy states there are no more refills

## 2014-09-17 ENCOUNTER — Other Ambulatory Visit (INDEPENDENT_AMBULATORY_CARE_PROVIDER_SITE_OTHER): Payer: 59

## 2014-09-17 DIAGNOSIS — E785 Hyperlipidemia, unspecified: Secondary | ICD-10-CM

## 2014-09-17 LAB — HEPATIC FUNCTION PANEL
ALT: 17 U/L (ref 0–53)
AST: 21 U/L (ref 0–37)
Albumin: 3.6 g/dL (ref 3.5–5.2)
Alkaline Phosphatase: 61 U/L (ref 39–117)
Bilirubin, Direct: 0.1 mg/dL (ref 0.0–0.3)
Total Bilirubin: 1.1 mg/dL (ref 0.2–1.2)
Total Protein: 7.1 g/dL (ref 6.0–8.3)

## 2014-09-17 LAB — LIPID PANEL
CHOLESTEROL: 128 mg/dL (ref 0–200)
HDL: 41.1 mg/dL (ref >39.00)
LDL CALC: 75 mg/dL (ref 0–99)
NonHDL: 86.9
Total CHOL/HDL Ratio: 3
Triglycerides: 59 mg/dL (ref 0.0–149.0)
VLDL: 11.8 mg/dL (ref 0.0–40.0)

## 2014-11-10 ENCOUNTER — Telehealth: Payer: Self-pay | Admitting: Family Medicine

## 2014-11-10 NOTE — Telephone Encounter (Signed)
Caller name:Denomme Deborah Relation to pt: spouse Call back [facility]  Reason for call: pt's spouse states pharmacy has requested rx twice and has not heard anything. Pt is need rx atorvastatin (LIPITOR) 20 MG tablet

## 2014-11-11 MED ORDER — ATORVASTATIN CALCIUM 20 MG PO TABS: 20.0000 mg | ORAL_TABLET | Freq: Every day | ORAL | Status: DC

## 2014-11-11 NOTE — Telephone Encounter (Signed)
Rx faxed.    KP 

## 2014-11-19 ENCOUNTER — Encounter: Payer: Self-pay | Admitting: Family Medicine

## 2014-11-19 ENCOUNTER — Ambulatory Visit (INDEPENDENT_AMBULATORY_CARE_PROVIDER_SITE_OTHER): Payer: 59 | Admitting: Family Medicine

## 2014-11-19 VITALS — BP 128/88 | HR 65 | Temp 98.1°F | Wt 147.8 lb

## 2014-11-19 DIAGNOSIS — E785 Hyperlipidemia, unspecified: Secondary | ICD-10-CM

## 2014-11-19 DIAGNOSIS — I10 Essential (primary) hypertension: Secondary | ICD-10-CM

## 2014-11-19 LAB — LIPID PANEL
CHOL/HDL RATIO: 3
Cholesterol: 129 mg/dL (ref 0–200)
HDL: 43.9 mg/dL (ref >39.00)
LDL CALC: 77 mg/dL (ref 0–99)
NONHDL: 85.1
Triglycerides: 42 mg/dL (ref 0.0–149.0)
VLDL: 8.4 mg/dL (ref 0.0–40.0)

## 2014-11-19 LAB — HEPATIC FUNCTION PANEL
ALT: 15 U/L (ref 0–53)
AST: 20 U/L (ref 0–37)
Albumin: 3.8 g/dL (ref 3.5–5.2)
Alkaline Phosphatase: 67 U/L (ref 39–117)
BILIRUBIN DIRECT: 0.2 mg/dL (ref 0.0–0.3)
BILIRUBIN TOTAL: 1.1 mg/dL (ref 0.2–1.2)
Total Protein: 6.5 g/dL (ref 6.0–8.3)

## 2014-11-19 LAB — BASIC METABOLIC PANEL
BUN: 7 mg/dL (ref 6–23)
CALCIUM: 9.3 mg/dL (ref 8.4–10.5)
CO2: 30 mEq/L (ref 19–32)
Chloride: 104 mEq/L (ref 96–112)
Creatinine, Ser: 0.8 mg/dL (ref 0.4–1.5)
GFR: 109.71 mL/min (ref >60.00)
Glucose, Bld: 83 mg/dL (ref 70–99)
Potassium: 3.8 mEq/L (ref 3.5–5.1)
SODIUM: 137 meq/L (ref 135–145)

## 2014-11-19 MED ORDER — LISINOPRIL-HYDROCHLOROTHIAZIDE 20-12.5 MG PO TABS: 2.0000 | ORAL_TABLET | Freq: Every day | ORAL | Status: DC

## 2014-11-19 NOTE — Patient Instructions (Signed)

## 2014-11-19 NOTE — Progress Notes (Signed)
  Subjective:    Patient here for follow-up of elevated blood pressure.  He is exercising and is adherent to a low-salt diet.  Blood pressure is well controlled at home. Cardiac symptoms: none. Patient denies: chest pain, chest pressure/discomfort, claudication, dyspnea, exertional chest pressure/discomfort, fatigue, irregular heart beat, lower extremity edema, near-syncope, orthopnea, palpitations, paroxysmal nocturnal dyspnea, syncope and tachypnea. Cardiovascular risk factors: dyslipidemia, hypertension and male gender. Use of agents associated with hypertension: none. History of target organ damage: none.  The following portions of the patient's history were reviewed and updated as appropriate: allergies, current medications, past family history, past medical history, past social history, past surgical history and problem list.  Review of Systems Pertinent items are noted in HPI.     Objective:    BP 128/88 mmHg  Pulse 65  Temp(Src) 98.1 F (36.7 C) (Oral)  Wt 147 lb 12.8 oz (67.042 kg)  SpO2 99% General appearance: alert, cooperative, appears stated age and no distress Throat: lips, mucosa, and tongue normal; teeth and gums normal Neck: no adenopathy, no carotid bruit, no JVD, supple, symmetrical, trachea midline and thyroid not enlarged, symmetric, no tenderness/mass/nodules Lungs: clear to auscultation bilaterally Heart: S1, S2 normal Extremities: extremities normal, atraumatic, no cyanosis or edema    Assessment:    Hypertension, normal blood pressure . Evidence of target organ damage: none.    Plan:    Medication: no change. Dietary sodium restriction. Regular aerobic exercise. Follow up: 6 months and as needed.    1. Hyperlipidemia Check labs con't meds - Hepatic function panel - Lipid panel  2. Essential hypertension   - Basic metabolic panel - lisinopril-hydrochlorothiazide (ZESTORETIC) 20-12.5 MG per tablet; Take 2 tablets by mouth daily.  Dispense: 180  tablet; Refill: 3

## 2014-11-19 NOTE — Progress Notes (Signed)
Pre visit review using our clinic review tool, if applicable. No additional management support is needed unless otherwise documented below in the visit note. 

## 2015-05-09 ENCOUNTER — Other Ambulatory Visit: Payer: Self-pay | Admitting: Family Medicine

## 2015-05-10 NOTE — Telephone Encounter (Signed)
Atorvastatin refill sent to pharmacy. Pt is due for office visit.  Please call pt to arrange appt. Thanks!

## 2015-05-11 NOTE — Telephone Encounter (Signed)
lvm advising pt to schedule appointment

## 2015-05-11 NOTE — Telephone Encounter (Signed)
Sent mychart message to pt. 

## 2015-05-31 ENCOUNTER — Ambulatory Visit (INDEPENDENT_AMBULATORY_CARE_PROVIDER_SITE_OTHER): Payer: 59 | Admitting: Family Medicine

## 2015-05-31 ENCOUNTER — Encounter: Payer: Self-pay | Admitting: Family Medicine

## 2015-05-31 VITALS — BP 126/80 | HR 79 | Temp 97.6°F | Resp 18 | Ht 66.0 in | Wt 151.0 lb

## 2015-05-31 DIAGNOSIS — Z23 Encounter for immunization: Secondary | ICD-10-CM | POA: Diagnosis not present

## 2015-05-31 DIAGNOSIS — I10 Essential (primary) hypertension: Secondary | ICD-10-CM

## 2015-05-31 DIAGNOSIS — E785 Hyperlipidemia, unspecified: Secondary | ICD-10-CM

## 2015-05-31 MED ORDER — ATORVASTATIN CALCIUM 20 MG PO TABS: 20.0000 mg | ORAL_TABLET | Freq: Every day | ORAL | Status: DC

## 2015-05-31 MED ORDER — LISINOPRIL-HYDROCHLOROTHIAZIDE 20-12.5 MG PO TABS: 2.0000 | ORAL_TABLET | Freq: Every day | ORAL | Status: DC

## 2015-05-31 MED ORDER — ZOSTER VACCINE LIVE 19400 UNT/0.65ML ~~LOC~~ SOLR: 0.6500 mL | Freq: Once | SUBCUTANEOUS | Status: DC

## 2015-05-31 NOTE — Progress Notes (Signed)
Patient ID: Drew [name], male    DOB: 1956-11-01  Age: 59 y.o. MRN: [id number]    Subjective:  Subjective HPI Drew [name] presents for f/u bp, hyperlipidemia.  Review of Systems  Constitutional: Negative for fatigue and unexpected weight change.  Respiratory: Negative for cough and shortness of breath.   Cardiovascular: Negative for chest pain and palpitations.    History Past Medical History  Diagnosis Date  . Hypertension     He has past surgical history that includes Hernia repair (Left, 2004).   His family history includes Graves' disease in his mother; Heart disease in his father, mother, paternal uncle, paternal uncle, paternal uncle, paternal uncle, paternal uncle, and paternal uncle; Hyperlipidemia in his brother; Hypertension in his brother and brother. There is no history of Colon cancer.He reports that he quit smoking about 27 years ago. His smoking use included Cigarettes. He has a 15 pack-year smoking history. His smokeless tobacco use includes Chew. He reports that he does not drink alcohol or use illicit drugs.  Current Outpatient Prescriptions on File Prior to Visit  Medication Sig Dispense Refill  . Flaxseed, Linseed, (FLAXSEED OIL) 1200 MG CAPS Take 2 capsules by mouth daily.    . Omega-3 Fatty Acids (FISH OIL) 1200 MG CAPS Take by mouth daily.     No current facility-administered medications on file prior to visit.     Objective:  Objective Physical Exam  Constitutional: He is oriented to person, place, and time. Vital signs are normal. He appears well-developed and well-nourished. He is sleeping.  HENT:  Head: Normocephalic and atraumatic.  Mouth/Throat: Oropharynx is clear and moist.  Eyes: EOM are normal. Pupils are equal, round, and reactive to light.  Neck: Normal range of motion. Neck supple. No thyromegaly present.  Cardiovascular: Normal rate and regular rhythm.   No murmur heard. Pulmonary/Chest: Effort normal and breath sounds normal. No  respiratory distress. He has no wheezes. He has no rales. He exhibits no tenderness.  Musculoskeletal: He exhibits no edema or tenderness.  Neurological: He is alert and oriented to person, place, and time.  Skin: Skin is warm and dry.  Psychiatric: He has a normal mood and affect. His behavior is normal. Judgment and thought content normal.   BP 126/80 mmHg  Pulse 79  Temp(Src) 97.6 F (36.4 C) (Oral)  Resp 18  Ht 5\' 6"  (1.676 m)  Wt 151 lb (68.493 kg)  BMI 24.38 kg/m2  SpO2 98% Wt Readings from Last 3 Encounters:  05/31/15 151 lb (68.493 kg)  11/19/14 147 lb 12.8 oz (67.042 kg)  05/28/14 146 lb (66.225 kg)     Lab Results  Component Value Date   WBC 8.1 01/18/2014   HGB 16.4* 01/18/2014   HCT 49.4* 01/18/2014   PLT 222.0 01/18/2014   GLUCOSE 83 11/19/2014   CHOL 129 11/19/2014   TRIG 42.0 11/19/2014   HDL 43.90 11/19/2014   LDLCALC 77 11/19/2014   ALT 15 11/19/2014   AST 20 11/19/2014   NA 137 11/19/2014   K 3.8 11/19/2014   CL 104 11/19/2014   CREATININE 0.8 11/19/2014   BUN 7 11/19/2014   CO2 30 11/19/2014   TSH 1.22 01/18/2014   PSA 1.03 01/18/2014   HGBA1C 5.5 05/28/2014    No results found.   Assessment & Plan:  Plan I have changed Drew [name] atorvastatin. I am also having him start on zoster vaccine live (PF). Additionally, I am having him maintain his Flaxseed Oil, Fish Oil, and lisinopril-hydrochlorothiazide.  Meds ordered this encounter  Medications  . zoster vaccine live, PF, (ZOSTAVAX) 46803 UNT/0.65ML injection    Sig: Inject 19,400 Units into the skin once.    Dispense:  1 each    Refill:  0  . lisinopril-hydrochlorothiazide (ZESTORETIC) 20-12.5 MG per tablet    Sig: Take 2 tablets by mouth daily.    Dispense:  180 tablet    Refill:  3  . atorvastatin (LIPITOR) 20 MG tablet    Sig: Take 1 tablet (20 mg total) by mouth daily.    Dispense:  90 tablet    Refill:  1    Problem List Items Addressed This Visit    HTN (hypertension) -  Primary   Relevant Medications   lisinopril-hydrochlorothiazide (ZESTORETIC) 20-12.5 MG per tablet   atorvastatin (LIPITOR) 20 MG tablet   Other Relevant Orders   Basic metabolic panel   Hepatic function panel    Other Visit Diagnoses    Hyperlipidemia        Relevant Medications    lisinopril-hydrochlorothiazide (ZESTORETIC) 20-12.5 MG per tablet    atorvastatin (LIPITOR) 20 MG tablet    Other Relevant Orders    Hepatic function panel    Lipid panel    Need for shingles vaccine        Relevant Medications    zoster vaccine live, PF, (ZOSTAVAX) 21224 UNT/0.65ML injection       Follow-up: Return in about 6 months (around 11/30/2015), or if symptoms worsen or fail to improve, for hyperlipidemia, hypertension, fasting.  Garnet Koyanagi, DO

## 2015-05-31 NOTE — Progress Notes (Signed)
Pre visit review using our clinic review tool, if applicable. No additional management support is needed unless otherwise documented below in the visit note. 

## 2015-05-31 NOTE — Patient Instructions (Signed)

## 2015-06-01 LAB — BASIC METABOLIC PANEL
BUN: 9 mg/dL (ref 6–23)
CO2: 29 mEq/L (ref 19–32)
Calcium: 9.6 mg/dL (ref 8.4–10.5)
Chloride: 101 mEq/L (ref 96–112)
Creatinine, Ser: 0.91 mg/dL (ref 0.40–1.50)
GFR: 90.31 mL/min (ref >60.00)
Glucose, Bld: 78 mg/dL (ref 70–99)
Potassium: 3.5 mEq/L (ref 3.5–5.1)
Sodium: 137 mEq/L (ref 135–145)

## 2015-06-01 LAB — LIPID PANEL
CHOL/HDL RATIO: 3
Cholesterol: 122 mg/dL (ref 0–200)
HDL: 45.8 mg/dL (ref >39.00)
LDL CALC: 66 mg/dL (ref 0–99)
NONHDL: 76.2
Triglycerides: 53 mg/dL (ref 0.0–149.0)
VLDL: 10.6 mg/dL (ref 0.0–40.0)

## 2015-06-01 LAB — HEPATIC FUNCTION PANEL
ALT: 19 U/L (ref 0–53)
AST: 22 U/L (ref 0–37)
Albumin: 4 g/dL (ref 3.5–5.2)
Alkaline Phosphatase: 62 U/L (ref 39–117)
BILIRUBIN TOTAL: 1.3 mg/dL — AB (ref 0.2–1.2)
Bilirubin, Direct: 0.3 mg/dL (ref 0.0–0.3)
Total Protein: 6.7 g/dL (ref 6.0–8.3)

## 2016-01-05 ENCOUNTER — Other Ambulatory Visit: Payer: Self-pay | Admitting: Family Medicine

## 2016-04-04 ENCOUNTER — Other Ambulatory Visit: Payer: Self-pay | Admitting: Family Medicine

## 2016-05-05 ENCOUNTER — Other Ambulatory Visit: Payer: Self-pay | Admitting: Family Medicine

## 2016-06-25 ENCOUNTER — Other Ambulatory Visit: Payer: Self-pay | Admitting: Family Medicine

## 2016-07-12 ENCOUNTER — Ambulatory Visit: Payer: 59 | Admitting: Family Medicine

## 2016-07-19 ENCOUNTER — Ambulatory Visit (INDEPENDENT_AMBULATORY_CARE_PROVIDER_SITE_OTHER): Payer: 59 | Admitting: Family Medicine

## 2016-07-19 ENCOUNTER — Encounter: Payer: Self-pay | Admitting: Family Medicine

## 2016-07-19 VITALS — BP 118/70 | HR 72 | Temp 98.2°F | Ht 66.0 in | Wt 148.8 lb

## 2016-07-19 DIAGNOSIS — I10 Essential (primary) hypertension: Secondary | ICD-10-CM | POA: Diagnosis not present

## 2016-07-19 DIAGNOSIS — E785 Hyperlipidemia, unspecified: Secondary | ICD-10-CM | POA: Diagnosis not present

## 2016-07-19 LAB — LIPID PANEL
CHOL/HDL RATIO: 3
Cholesterol: 177 mg/dL (ref 0–200)
HDL: 52.3 mg/dL (ref >39.00)
LDL CALC: 114 mg/dL — AB (ref 0–99)
NONHDL: 124.37
Triglycerides: 54 mg/dL (ref 0.0–149.0)
VLDL: 10.8 mg/dL (ref 0.0–40.0)

## 2016-07-19 LAB — COMPREHENSIVE METABOLIC PANEL
ALK PHOS: 57 U/L (ref 39–117)
ALT: 15 U/L (ref 0–53)
AST: 18 U/L (ref 0–37)
Albumin: 4.1 g/dL (ref 3.5–5.2)
BUN: 9 mg/dL (ref 6–23)
CHLORIDE: 102 meq/L (ref 96–112)
CO2: 32 meq/L (ref 19–32)
Calcium: 9.8 mg/dL (ref 8.4–10.5)
Creatinine, Ser: 0.89 mg/dL (ref 0.40–1.50)
GFR: 92.3 mL/min (ref >60.00)
GLUCOSE: 101 mg/dL — AB (ref 70–99)
POTASSIUM: 3.9 meq/L (ref 3.5–5.1)
SODIUM: 139 meq/L (ref 135–145)
TOTAL PROTEIN: 6.8 g/dL (ref 6.0–8.3)
Total Bilirubin: 0.9 mg/dL (ref 0.2–1.2)

## 2016-07-19 MED ORDER — LISINOPRIL-HYDROCHLOROTHIAZIDE 20-12.5 MG PO TABS: 2.0000 | ORAL_TABLET | Freq: Every day | ORAL | 5 refills | Status: DC

## 2016-07-19 NOTE — Progress Notes (Signed)
Patient ID: Drew [name], male    DOB: 22-Jun-1956  Age: 60 y.o. MRN: [id number]    Subjective:  Subjective  HPI ZACHARIE TOTINO presents for htn and cholesterol -- no complaints  Review of Systems  Constitutional: Negative for appetite change, diaphoresis, fatigue and unexpected weight change.  Eyes: Negative for pain, redness and visual disturbance.  Respiratory: Negative for cough, chest tightness, shortness of breath and wheezing.   Cardiovascular: Negative for chest pain, palpitations and leg swelling.  Endocrine: Negative for cold intolerance, heat intolerance, polydipsia, polyphagia and polyuria.  Genitourinary: Negative for difficulty urinating, dysuria and frequency.  Neurological: Negative for dizziness, light-headedness, numbness and headaches.    History Past Medical History:  Diagnosis Date  . Hypertension     He has a past surgical history that includes Hernia repair (Left, 2004).   His family history includes Graves' disease in his mother; Heart disease in his father, mother, paternal uncle, paternal uncle, paternal uncle, paternal uncle, paternal uncle, and paternal uncle; Hyperlipidemia in his brother; Hypertension in his brother and brother.He reports that he quit smoking about 28 years ago. His smoking use included Cigarettes. He has a 15.00 pack-year smoking history. His smokeless tobacco use includes Chew. He reports that he does not drink alcohol or use drugs.  Current Outpatient Prescriptions on File Prior to Visit  Medication Sig Dispense Refill  . Flaxseed, Linseed, (FLAXSEED OIL) 1200 MG CAPS Take 2 capsules by mouth daily.    . Omega-3 Fatty Acids (FISH OIL) 1200 MG CAPS Take by mouth daily.     No current facility-administered medications on file prior to visit.      Objective:  Objective  Physical Exam  Constitutional: He is oriented to person, place, and time. Vital signs are normal. He appears well-developed and well-nourished. He is sleeping.    HENT:  Head: Normocephalic and atraumatic.  Mouth/Throat: Oropharynx is clear and moist.  Eyes: EOM are normal. Pupils are equal, round, and reactive to light.  Neck: Normal range of motion. Neck supple. No thyromegaly present.  Cardiovascular: Normal rate and regular rhythm.   No murmur heard. Pulmonary/Chest: Effort normal and breath sounds normal. No respiratory distress. He has no wheezes. He has no rales. He exhibits no tenderness.  Musculoskeletal: He exhibits no edema or tenderness.  Neurological: He is alert and oriented to person, place, and time.  Skin: Skin is warm and dry.  Psychiatric: He has a normal mood and affect. His behavior is normal. Judgment and thought content normal.  Nursing note and vitals reviewed.  BP 118/70 (BP Location: Left Arm, Patient Position: Sitting, Cuff Size: Normal)   Pulse 72   Temp 98.2 F (36.8 C) (Oral)   Ht 5\' 6"  (1.676 m)   Wt 148 lb 12.8 oz (67.5 kg)   SpO2 98%   BMI 24.02 kg/m  Wt Readings from Last 3 Encounters:  07/19/16 148 lb 12.8 oz (67.5 kg)  05/31/15 151 lb (68.5 kg)  11/19/14 147 lb 12.8 oz (67 kg)     Lab Results  Component Value Date   WBC 8.1 01/18/2014   HGB 16.4 (H) 01/18/2014   HCT 49.4 (H) 01/18/2014   PLT 222.0 01/18/2014   GLUCOSE 78 05/31/2015   CHOL 122 05/31/2015   TRIG 53.0 05/31/2015   HDL 45.80 05/31/2015   LDLCALC 66 05/31/2015   ALT 19 05/31/2015   AST 22 05/31/2015   NA 137 05/31/2015   K 3.5 05/31/2015   CL 101 05/31/2015  CREATININE 0.91 05/31/2015   BUN 9 05/31/2015   CO2 29 05/31/2015   TSH 1.22 01/18/2014   PSA 1.03 01/18/2014   HGBA1C 5.5 05/28/2014    No results found.   Assessment & Plan:  Plan  I have discontinued Mr. Galeski zoster vaccine live (PF) and atorvastatin. I have also changed his lisinopril-hydrochlorothiazide. Additionally, I am having him maintain his Flaxseed Oil and Fish Oil.  Meds ordered this encounter  Medications  . lisinopril-hydrochlorothiazide  (PRINZIDE,ZESTORETIC) 20-12.5 MG tablet    Sig: Take 2 tablets by mouth daily.    Dispense:  60 tablet    Refill:  5    Problem List Items Addressed This Visit      Unprioritized   HTN (hypertension) - Primary    Stable con't meds      Relevant Medications   lisinopril-hydrochlorothiazide (PRINZIDE,ZESTORETIC) 20-12.5 MG tablet   Other Relevant Orders   Comprehensive metabolic panel   Lipid panel   Hyperlipidemia    Pt not taking lipitor due to myalgias Check labs today      Relevant Medications   lisinopril-hydrochlorothiazide (PRINZIDE,ZESTORETIC) 20-12.5 MG tablet   Other Relevant Orders   Comprehensive metabolic panel   Lipid panel    Other Visit Diagnoses   None.     Follow-up: Return in about 6 months (around 01/19/2017) for hypertension, hyperlipidemia.  [name], DO

## 2016-07-19 NOTE — Assessment & Plan Note (Signed)
Pt not taking lipitor due to myalgias Check labs today

## 2016-07-19 NOTE — Assessment & Plan Note (Signed)
Stable con't meds 

## 2016-07-19 NOTE — Patient Instructions (Signed)
Hypertension Hypertension, commonly called high blood pressure, is when the force of blood pumping through your arteries is too strong. Your arteries are the blood vessels that carry blood from your heart throughout your body. A blood pressure reading consists of a higher number over a lower number, such as 110/72. The higher number (systolic) is the pressure inside your arteries when your heart pumps. The lower number (diastolic) is the pressure inside your arteries when your heart relaxes. Ideally you want your blood pressure below 120/80. Hypertension forces your heart to work harder to pump blood. Your arteries may become narrow or stiff. Having untreated or uncontrolled hypertension can cause heart attack, stroke, kidney disease, and other problems. RISK FACTORS Some risk factors for high blood pressure are controllable. Others are not.  Risk factors you cannot control include:   Race. You may be at higher risk if you are African American.  Age. Risk increases with age.  Gender. Men are at higher risk than women before age 45 years. After age 65, women are at higher risk than men. Risk factors you can control include:  Not getting enough exercise or physical activity.  Being overweight.  Getting too much fat, sugar, calories, or salt in your diet.  Drinking too much alcohol. SIGNS AND SYMPTOMS Hypertension does not usually cause signs or symptoms. Extremely high blood pressure (hypertensive crisis) may cause headache, anxiety, shortness of breath, and nosebleed. DIAGNOSIS To check if you have hypertension, your health care provider will measure your blood pressure while you are seated, with your arm held at the level of your heart. It should be measured at least twice using the same arm. Certain conditions can cause a difference in blood pressure between your right and left arms. A blood pressure reading that is higher than normal on one occasion does not mean that you need treatment. If  it is not clear whether you have high blood pressure, you may be asked to return on a different day to have your blood pressure checked again. Or, you may be asked to monitor your blood pressure at home for 1 or more weeks. TREATMENT Treating high blood pressure includes making lifestyle changes and possibly taking medicine. Living a healthy lifestyle can help lower high blood pressure. You may need to change some of your habits. Lifestyle changes may include:  Following the DASH diet. This diet is high in fruits, vegetables, and whole grains. It is low in salt, red meat, and added sugars.  Keep your sodium intake below 2,300 mg per day.  Getting at least 30-45 minutes of aerobic exercise at least 4 times per week.  Losing weight if necessary.  Not smoking.  Limiting alcoholic beverages.  Learning ways to reduce stress. Your health care provider may prescribe medicine if lifestyle changes are not enough to get your blood pressure under control, and if one of the following is true:  You are 18-59 years of age and your systolic blood pressure is above 140.  You are 60 years of age or older, and your systolic blood pressure is above 150.  Your diastolic blood pressure is above 90.  You have diabetes, and your systolic blood pressure is over 140 or your diastolic blood pressure is over 90.  You have kidney disease and your blood pressure is above 140/90.  You have heart disease and your blood pressure is above 140/90. Your personal target blood pressure may vary depending on your medical conditions, your age, and other factors. HOME CARE INSTRUCTIONS    Have your blood pressure rechecked as directed by your health care provider.   Take medicines only as directed by your health care provider. Follow the directions carefully. Blood pressure medicines must be taken as prescribed. The medicine does not work as well when you skip doses. Skipping doses also puts you at risk for  problems.  Do not smoke.   Monitor your blood pressure at home as directed by your health care provider. SEEK MEDICAL CARE IF:   You think you are having a reaction to medicines taken.  You have recurrent headaches or feel dizzy.  You have swelling in your ankles.  You have trouble with your vision. SEEK IMMEDIATE MEDICAL CARE IF:  You develop a severe headache or confusion.  You have unusual weakness, numbness, or feel faint.  You have severe chest or abdominal pain.  You vomit repeatedly.  You have trouble breathing. MAKE SURE YOU:   Understand these instructions.  Will watch your condition.  Will get help right away if you are not doing well or get worse.   This information is not intended to replace advice given to you by your health care provider. Make sure you discuss any questions you have with your health care provider.   Document Released: 11/26/2005 Document Revised: 04/12/2015 Document Reviewed: 09/18/2013 Elsevier Interactive Patient Education 2016 [employer] Inc[employer]  

## 2016-07-25 ENCOUNTER — Other Ambulatory Visit: Payer: Self-pay

## 2016-07-25 MED ORDER — PRAVASTATIN SODIUM 20 MG PO TABS: 20.0000 mg | ORAL_TABLET | Freq: Every day | ORAL | 2 refills | Status: DC

## 2016-10-25 ENCOUNTER — Telehealth: Payer: Self-pay | Admitting: Family Medicine

## 2016-10-25 NOTE — Telephone Encounter (Signed)
Left message on pt's vm stating that he doesn't need to come in sooner than his 01/28/16 to recheck lab. Per pcp recheck labs in 3 mths from last ov in August. LB

## 2016-10-25 NOTE — Telephone Encounter (Signed)
Pt says that he started a new medication. Pt would like to know if he need to come in sooner than appt scheduled to have labs?    Please advise.

## 2016-10-26 ENCOUNTER — Other Ambulatory Visit: Payer: Self-pay | Admitting: Family Medicine

## 2016-11-28 ENCOUNTER — Encounter (HOSPITAL_BASED_OUTPATIENT_CLINIC_OR_DEPARTMENT_OTHER): Payer: Self-pay | Admitting: Emergency Medicine

## 2016-11-28 ENCOUNTER — Emergency Department (HOSPITAL_BASED_OUTPATIENT_CLINIC_OR_DEPARTMENT_OTHER)
Admission: EM | Admit: 2016-11-28 | Discharge: 2016-11-28 | Disposition: A | Discharge status: Home / Self Care | Payer: 59 | Attending: Emergency Medicine | Admitting: Emergency Medicine

## 2016-11-28 DIAGNOSIS — Z87891 Personal history of nicotine dependence: Secondary | ICD-10-CM | POA: Insufficient documentation

## 2016-11-28 DIAGNOSIS — I10 Essential (primary) hypertension: Secondary | ICD-10-CM | POA: Insufficient documentation

## 2016-11-28 DIAGNOSIS — R55 Syncope and collapse: Secondary | ICD-10-CM | POA: Diagnosis not present

## 2016-11-28 LAB — URINALYSIS, ROUTINE W REFLEX MICROSCOPIC
Bilirubin Urine: NEGATIVE
GLUCOSE, UA: NEGATIVE mg/dL
HGB URINE DIPSTICK: NEGATIVE
Ketones, ur: 15 mg/dL — AB
LEUKOCYTES UA: NEGATIVE
Nitrite: NEGATIVE
PROTEIN: NEGATIVE mg/dL
SPECIFIC GRAVITY, URINE: 1.012 (ref 1.005–1.030)
pH: 7.5 (ref 5.0–8.0)

## 2016-11-28 LAB — BASIC METABOLIC PANEL
ANION GAP: 7 (ref 5–15)
BUN: 10 mg/dL (ref 6–20)
CHLORIDE: 94 mmol/L — AB (ref 101–111)
CO2: 30 mmol/L (ref 22–32)
Calcium: 8.1 mg/dL — ABNORMAL LOW (ref 8.9–10.3)
Creatinine, Ser: 0.97 mg/dL (ref 0.61–1.24)
GFR calc Af Amer: >60 mL/min (ref >60)
GLUCOSE: 113 mg/dL — AB (ref 65–99)
POTASSIUM: 3.6 mmol/L (ref 3.5–5.1)
SODIUM: 131 mmol/L — AB (ref 135–145)

## 2016-11-28 LAB — CBC
HEMATOCRIT: 42.9 % (ref 39.0–52.0)
HEMOGLOBIN: 15.1 g/dL (ref 13.0–17.0)
MCH: 32.8 pg (ref 26.0–34.0)
MCHC: 35.2 g/dL (ref 30.0–36.0)
MCV: 93.3 fL (ref 78.0–100.0)
Platelets: 134 10*3/uL — ABNORMAL LOW (ref 150–400)
RBC: 4.6 MIL/uL (ref 4.22–5.81)
RDW: 12 % (ref 11.5–15.5)
WBC: 6.8 10*3/uL (ref 4.0–10.5)

## 2016-11-28 MED ORDER — SODIUM CHLORIDE 0.9 % IV BOLUS (SEPSIS)
1000.0000 mL | Freq: Once | INTRAVENOUS | Status: AC
  Administered 2016-11-28: 1000 mL via INTRAVENOUS

## 2016-11-28 NOTE — ED Notes (Signed)
Pt ambulated in hall. Pt denies SOB and dizziness.

## 2016-11-28 NOTE — ED Triage Notes (Addendum)
Pt was at work, felt dizzy.  Sat down for a while, started to pass out.  Co-workers helped to the ground stated that he did have LOC.  Pt feels better now.  Per EMS  CBG 114.  Positive orthostatics.  NS 500 cc bolus in place. Pt has had a cold for several days.

## 2016-11-28 NOTE — ED Provider Notes (Signed)
[facility] Provider Note   CSN: [id number] Arrival date & time: 11/28/16  1049     History   Chief Complaint Chief Complaint  Patient presents with  . Loss of Consciousness    HPI Drew [name] is a 60 y.o. male.   Loss of Consciousness   This is a recurrent problem. The current episode started less than 1 hour ago. The problem occurs rarely. The problem has been resolved. He lost consciousness for a period of less than one minute. The problem is associated with normal activity. Associated symptoms include diaphoresis and nausea. Pertinent negatives include chest pain, headaches and palpitations. He has tried nothing for the symptoms. The treatment provided no relief.    Past Medical History:  Diagnosis Date  . Hypertension     Patient Active Problem List   Diagnosis Date Noted  . Hyperlipidemia 07/19/2016  . HTN (hypertension) 01/18/2014    Past Surgical History:  Procedure Laterality Date  . HERNIA REPAIR Left 2004       Home Medications    Prior to Admission medications   Medication Sig Start Date End Date Taking? Authorizing Provider  Flaxseed, Linseed, (FLAXSEED OIL) 1200 MG CAPS Take 2 capsules by mouth daily.    Historical Provider, MD  lisinopril-hydrochlorothiazide (PRINZIDE,ZESTORETIC) 20-12.5 MG tablet Take 2 tablets by mouth daily. 07/19/16   Rosalita Chessman Chase, DO  Omega-3 Fatty Acids (FISH OIL) 1200 MG CAPS Take by mouth daily.    Historical Provider, MD  pravastatin (PRAVACHOL) 20 MG tablet TAKE ONE TABLET BY MOUTH ONCE DAILY 10/26/16   [name], DO    Family History Family History  Problem Relation Age of Onset  . Heart disease Mother     Duanne Limerick  . Berenice Primas' disease Mother   . Heart disease Father   . Hyperlipidemia Brother   . Hypertension Brother   . Heart disease Paternal Uncle   . Heart disease Paternal Uncle   . Heart disease Paternal Uncle   . Heart disease Paternal Uncle   . Heart disease Paternal Uncle     . Heart disease Paternal Uncle   . Hypertension Brother   . Colon cancer Neg Hx     Social History Social History  Substance Use Topics  . Smoking status: Former Smoker    Packs/day: 1.00    Years: 15.00    Types: Cigarettes    Quit date: 01/19/1988  . Smokeless tobacco: Current User    Types: Chew  . Alcohol use No     Allergies   Patient has no known allergies.   Review of Systems Review of Systems  Constitutional: Positive for diaphoresis.  Respiratory: Negative for chest tightness.   Cardiovascular: Positive for syncope. Negative for chest pain and palpitations.  Gastrointestinal: Positive for nausea.  Neurological: Negative for headaches.  All other systems reviewed and are negative.    Physical Exam Updated Vital Signs BP 152/88 (BP Location: Right Arm)   Pulse 81   Temp 99 F (37.2 C) (Oral)   Resp 16   Ht 5\' 6"  (1.676 m)   Wt 145 lb (65.8 kg)   SpO2 97%   BMI 23.40 kg/m   Physical Exam  Constitutional: He is oriented to person, place, and time. He appears well-developed and well-nourished.  HENT:  Head: Normocephalic and atraumatic.  Eyes: Conjunctivae and EOM are normal.  Neck: Normal range of motion.  Cardiovascular: Normal rate.   Pulmonary/Chest: Effort normal. No respiratory distress.  Abdominal:  Soft. He exhibits no distension. There is no tenderness.  Musculoskeletal: Normal range of motion.  Neurological: He is alert and oriented to person, place, and time.  No altered mental status, able to give full seemingly accurate history.  Face is symmetric, EOM's intact, pupils equal and reactive, vision intact, tongue and uvula midline without deviation Upper and Lower extremity motor 5/5, intact pain perception in distal extremities, 2+ reflexes in biceps, patella and achilles tendons. Finger to nose normal, heel to shin normal.   Skin: Skin is warm and dry.  Nursing note and vitals reviewed.    ED Treatments / Results  Labs (all labs  ordered are listed, but only abnormal results are displayed) Labs Reviewed  CBC - Abnormal; Notable for the following:       Result Value   Platelets 134 (*)    All other components within normal limits  URINALYSIS, ROUTINE W REFLEX MICROSCOPIC - Abnormal; Notable for the following:    Ketones, ur 15 (*)    All other components within normal limits  BASIC METABOLIC PANEL - Abnormal; Notable for the following:    Sodium 131 (*)    Chloride 94 (*)    Glucose, Bld 113 (*)    Calcium 8.1 (*)    All other components within normal limits    EKG  EKG Interpretation  Date/Time:  Wednesday November 28 2016 11:03:13 EST Ventricular Rate:  83 PR Interval:    QRS Duration: 101 QT Interval:  382 QTC Calculation: 449 R Axis:   45 Text Interpretation:  Sinus rhythm Left atrial enlargement RSR' in V1 or V2, right VCD or RVH No significant change since last tracing Confirmed by [facility] MD, Corene Cornea [phone]) on 11/28/2016 11:36:09 AM       Radiology No results found.  Procedures Procedures (including critical care time)  Medications Ordered in ED Medications  sodium chloride 0.9 % bolus 1,000 mL (0 mLs Intravenous Stopped 11/28/16 1323)     Initial Impression / Assessment and Plan / ED Course  I have reviewed the triage vital signs and the nursing notes.  Pertinent labs & imaging results that were available during my care of the patient were reviewed by me and considered in my medical decision making (see chart for details).  Clinical Course     60 yo M  presented with presyncope/syncope lasting approximately 2-3 seconds and associated with lightheadedness and sweating. Pertinent physical exam findings include none and pertinent labs and imaging revealed normal . This is most consistent with vasovagal/hypovolemia syncopal episode. Also on the differential diagnosis was seizure, TIA/Stroke, hypoglycemia, narcolepsy. Doubt the patient had a seizure without a history of seizure, no  post-ictal state or incontinence. history not consistent with stroke or TIA. Hypoglycemia unlikely as patient improved without intervention and had normal glucose. For syncope, there are many possible causes however based on history and physical I feel like cardiac causes are unlikely with normal ECG (as read above), no new murmurs and no evidence of arrhythmias while on prolonged (2.5 hours) continuous monitoring here. Patient not hypotensive here and upon review of the records there are no drugs/medical problems that would point toward this as a cause. Unsure of actual cause of syncope, however I think it is most consistent with a vasovagal type of syncope and emergent causes are unlikely at this time and they can continue workup as an outpatient with PCP.   Final Clinical Impressions(s) / ED Diagnoses   Final diagnoses:  Syncope and collapse  New Prescriptions Discharge Medication List as of 11/28/2016  2:46 PM       Merrily Pew, MD 11/29/16 [phone]

## 2016-11-29 ENCOUNTER — Ambulatory Visit (INDEPENDENT_AMBULATORY_CARE_PROVIDER_SITE_OTHER): Payer: 59 | Admitting: Medical

## 2016-11-29 ENCOUNTER — Telehealth: Payer: Self-pay | Admitting: Medical

## 2016-11-29 ENCOUNTER — Encounter: Payer: Self-pay | Admitting: Medical

## 2016-11-29 VITALS — BP 122/76 | HR 74 | Temp 98.3°F | Resp 16 | Ht 66.0 in | Wt 150.8 lb

## 2016-11-29 DIAGNOSIS — R55 Syncope and collapse: Secondary | ICD-10-CM | POA: Diagnosis not present

## 2016-11-29 DIAGNOSIS — E860 Dehydration: Secondary | ICD-10-CM | POA: Diagnosis not present

## 2016-11-29 DIAGNOSIS — R739 Hyperglycemia, unspecified: Secondary | ICD-10-CM

## 2016-11-29 NOTE — Patient Instructions (Addendum)
Your recent syncope may have been vasovagal syncope related to dehydration. Some of labs in ED support this. However I do think it would be a good idea to expand work work and refer you to cardiologist to evaluate if cardiac origin. Also will try to order ct of head to see if neurologic cause.   Keep yourself hydrate. If recurrent syncope pending above studies then be seen in ED.  Follow up as regularly scheduled with pcp. We will update you if ct approved by insurance and update you on cardiologist appointment.   Syncope Introduction Syncope is when you lose temporarily pass out (faint). Signs that you may be about to pass out include:  Feeling dizzy or light-headed.  Feeling sick to your stomach (nauseous).  Seeing all white or all black.  Having cold, clammy skin. If you passed out, get help right away. Call your local emergency services (911 in the [place] Do not drive yourself to the hospital. Follow these instructions at home: Pay attention to any changes in your symptoms. Take these actions to help with your condition:  Have someone stay with you until you feel stable.  Do not drive, use machinery, or play sports until your doctor says it is okay.  Keep all follow-up visits as told by your doctor. This is important.  If you start to feel like you might pass out, lie down right away and raise (elevate) your feet above the level of your heart. Breathe deeply and steadily. Wait until all of the symptoms are gone.  Drink enough fluid to keep your pee (urine) clear or pale yellow.  If you are taking blood pressure or heart medicine, get up slowly and spend many minutes getting ready to sit and then stand. This can help with dizziness.  Take over-the-counter and prescription medicines only as told by your doctor. Get help right away if:  You have a very bad headache.  You have unusual pain in your chest, tummy, or back.  You are bleeding from your mouth or rectum.  You have  black or tarry poop (stool).  You have a very fast or uneven heartbeat (palpitations).  It hurts to breathe.  You pass out once or more than once.  You have jerky movements that you cannot control (seizure).  You are confused.  You have trouble walking.  You are very weak.  You have vision problems. These symptoms may be an emergency. Do not wait to see if the symptoms will go away. Get medical help right away. Call your local emergency services (911 in the [place] Do not drive yourself to the hospital.  This information is not intended to replace advice given to you by your health care provider. Make sure you discuss any questions you have with your health care provider. Document Released: 05/14/2008 Document Revised: 05/03/2016 Document Reviewed: 08/10/2015  2017 Elsevier

## 2016-11-29 NOTE — Telephone Encounter (Signed)
I put in a1c and cmp. Reviwed ED notes the other day and saw sugar mild high and na mild low. Pt last had a1c in 2015 from what I see. So good idea to see his 3 month average now. Can you put schedule him just for lab visit at his convenience.

## 2016-11-29 NOTE — Progress Notes (Signed)
Subjective:    Patient ID: Drew [name], male    DOB: 1956/04/07, 61 y.o.   MRN: [id number]  HPI   Pt in states syncope that occurred just yesterday. He mentioned maybe other event that occurred randomly years ago. 10 years ago with first event he specuated that he might have been sick with a virus. But he is not sure of this. Sounds like exact etiology 10 years ago not determined.  However yesterday he states he had loc for one minute. He felt light headed and talked with a coworker and next minute he was laying on ground for a minute at most. When he came to did not have ha. No incontinence. No chest pain, no palpitations before the syncope event.  Pt states emt came and they evaluated pt and then took him to the ED.  Pt stats ED maybe thought he was dehydrated.  Pt had some ketones in urine yesterday.   ekg looked ok. No anemia and cmp did show mild low na and mild increased sugar. Pt was given IV hydration in ED.  He reports feeling fine today. No cardiac or neurologic signs or symptoms.  Pt states before this recent event he was congested little and took nyquil.   Review of Systems  Constitutional: Negative for chills, fatigue and fever.  HENT: Negative for congestion, ear discharge, ear pain, hearing loss, mouth sores, postnasal drip, sinus pressure, sneezing and sore throat.        Faint congestion recently.  Respiratory: Negative for cough, choking, shortness of breath and wheezing.   Cardiovascular: Negative for chest pain and palpitations.  Gastrointestinal: Negative for abdominal pain, blood in stool, constipation, nausea and vomiting.  Musculoskeletal: Negative for back pain.  Neurological: Negative for dizziness, tremors, syncope, speech difficulty, weakness, light-headedness and headaches.       No symptoms presently.  Psychiatric/Behavioral: Negative for agitation, behavioral problems, confusion, decreased concentration, dysphoric mood and hallucinations.    Past  Medical History:  Diagnosis Date  . Hypertension      Social History   Social History  . Marital status: Married    Spouse name: N/A  . Number of children: N/A  . Years of education: N/A   Occupational History  . Not on file.   Social History Main Topics  . Smoking status: Former Smoker    Packs/day: 1.00    Years: 15.00    Types: Cigarettes    Quit date: 01/19/1988  . Smokeless tobacco: Current User    Types: Chew  . Alcohol use No  . Drug use: No  . Sexual activity: Yes   Other Topics Concern  . Not on file   Social History Narrative  . No narrative on file    Past Surgical History:  Procedure Laterality Date  . HERNIA REPAIR Left 2004    Family History  Problem Relation Age of Onset  . Heart disease Mother     Duanne Limerick  . Berenice Primas' disease Mother   . Heart disease Father   . Hyperlipidemia Brother   . Hypertension Brother   . Heart disease Paternal Uncle   . Heart disease Paternal Uncle   . Heart disease Paternal Uncle   . Heart disease Paternal Uncle   . Heart disease Paternal Uncle   . Heart disease Paternal Uncle   . Hypertension Brother   . Colon cancer Neg Hx     No Known Allergies  Current Outpatient Prescriptions on File Prior to Visit  Medication Sig  Dispense Refill  . Flaxseed, Linseed, (FLAXSEED OIL) 1200 MG CAPS Take 2 capsules by mouth daily.    Marland Kitchen lisinopril-hydrochlorothiazide (PRINZIDE,ZESTORETIC) 20-12.5 MG tablet Take 2 tablets by mouth daily. 60 tablet 5  . Omega-3 Fatty Acids (FISH OIL) 1200 MG CAPS Take by mouth daily.    . pravastatin (PRAVACHOL) 20 MG tablet TAKE ONE TABLET BY MOUTH ONCE DAILY 30 tablet 2   No current facility-administered medications on file prior to visit.     BP 122/76 (BP Location: Right Arm, Cuff Size: Normal)   Pulse 74   Temp 98.3 F (36.8 C) (Oral)   Resp 16   Ht 5\' 6"  (1.676 m)   Wt 150 lb 12.8 oz (68.4 kg)   SpO2 96%   BMI 24.34 kg/m       Objective:   Physical Exam  General Mental Status-  Alert. General Appearance- Not in acute distress.   Skin General: Color- Normal Color. Moisture- Normal Moisture.  Neck Carotid Arteries- Normal color. Moisture- Normal Moisture. No carotid bruits. No JVD.  Chest and Lung Exam Auscultation: Breath Sounds:-Normal.  Cardiovascular Auscultation:Rythm- Regular. Murmurs & Other Heart Sounds:Auscultation of the heart reveals- No Murmurs.  Abdomen Inspection:-Inspeection Normal. Palpation/Percussion:Note:No mass. Palpation and Percussion of the abdomen reveal- Non Tender, Non Distended + BS, no rebound or guarding.    Neurologic Cranial Nerve exam:- CN III-XII intact(No nystagmus), symmetric smile. Drift Test:- No drift. Romberg Exam:- Negative.  Finger to Nose:- Normal/Intact Strength:- 5/5 equal and symmetric strength both upper and lower extremities.      Assessment & Plan:  Your recent syncope may have been vasovagal syncope related to dehydration. Some of labs in ED support this. However I do think it would be a good idea to expand work work and refer you to cardiologist to evaluate if cardiac origin. Also will try to order ct of head to see if neurologic cause.   Keep yourself hydrate. If recurrent syncope pending above studies then be seen in ED.  Follow up as regularly scheduled with pcp. We will update you if ct approved by insurance and update you on cardiologist appointment.  Aamna Mallozzi, Percell Miller, PA-C

## 2016-11-29 NOTE — Progress Notes (Signed)
Pre visit review using our clinic review tool, if applicable. No additional management support is needed unless otherwise documented below in the visit note. 

## 2016-11-29 NOTE — Telephone Encounter (Signed)
I put in ct of head to evaluate recent syncopal event. Would you see if this is authorized and if so get him scheduled for within next week.  Also on cardiology referral can they see him some time within 2 wks.

## 2016-11-30 NOTE — Telephone Encounter (Signed)
Please schedule pt for lab only appt at his convenience. Orders have been placed by Percell Miller.

## 2016-11-30 NOTE — Telephone Encounter (Signed)
LVM informing patient Drew [name] would like him to schedule a lab appointment at his convenience.

## 2016-12-04 ENCOUNTER — Ambulatory Visit (HOSPITAL_BASED_OUTPATIENT_CLINIC_OR_DEPARTMENT_OTHER)
Admission: RE | Admit: 2016-12-04 | Discharge: 2016-12-04 | Disposition: A | Discharge status: Home / Self Care | Payer: 59 | Source: Ambulatory Visit | Attending: Medical | Admitting: Medical

## 2016-12-04 ENCOUNTER — Telehealth: Payer: Self-pay | Admitting: Medical

## 2016-12-04 ENCOUNTER — Other Ambulatory Visit (INDEPENDENT_AMBULATORY_CARE_PROVIDER_SITE_OTHER): Payer: 59

## 2016-12-04 DIAGNOSIS — I6523 Occlusion and stenosis of bilateral carotid arteries: Secondary | ICD-10-CM | POA: Diagnosis not present

## 2016-12-04 DIAGNOSIS — R55 Syncope and collapse: Secondary | ICD-10-CM | POA: Diagnosis present

## 2016-12-04 DIAGNOSIS — R739 Hyperglycemia, unspecified: Secondary | ICD-10-CM

## 2016-12-04 DIAGNOSIS — J3489 Other specified disorders of nose and nasal sinuses: Secondary | ICD-10-CM | POA: Insufficient documentation

## 2016-12-04 DIAGNOSIS — E876 Hypokalemia: Secondary | ICD-10-CM

## 2016-12-04 LAB — COMPREHENSIVE METABOLIC PANEL
ALT: 32 U/L (ref 0–53)
AST: 35 U/L (ref 0–37)
Albumin: 3.4 g/dL — ABNORMAL LOW (ref 3.5–5.2)
Alkaline Phosphatase: 85 U/L (ref 39–117)
BUN: 7 mg/dL (ref 6–23)
CALCIUM: 9.2 mg/dL (ref 8.4–10.5)
CHLORIDE: 98 meq/L (ref 96–112)
CO2: 35 meq/L — AB (ref 19–32)
CREATININE: 0.82 mg/dL (ref 0.40–1.50)
GFR: 101.33 mL/min (ref >60.00)
Glucose, Bld: 101 mg/dL — ABNORMAL HIGH (ref 70–99)
POTASSIUM: 3 meq/L — AB (ref 3.5–5.1)
SODIUM: 140 meq/L (ref 135–145)
Total Bilirubin: 0.6 mg/dL (ref 0.2–1.2)
Total Protein: 6.7 g/dL (ref 6.0–8.3)

## 2016-12-04 LAB — HEMOGLOBIN A1C: Hgb A1c MFr Bld: 5.8 % (ref 4.6–6.5)

## 2016-12-04 MED ORDER — POTASSIUM CHLORIDE CRYS ER 10 MEQ PO TBCR: 10.0000 meq | EXTENDED_RELEASE_TABLET | Freq: Every day | ORAL | 0 refills | Status: DC

## 2016-12-04 NOTE — Telephone Encounter (Signed)
Future cmp order placed.

## 2017-01-03 ENCOUNTER — Encounter: Payer: Self-pay | Admitting: Interventional Cardiology

## 2017-01-03 ENCOUNTER — Ambulatory Visit (INDEPENDENT_AMBULATORY_CARE_PROVIDER_SITE_OTHER): Payer: 59 | Admitting: Interventional Cardiology

## 2017-01-03 VITALS — BP 122/80 | HR 77 | Ht 66.0 in | Wt 152.0 lb

## 2017-01-03 DIAGNOSIS — E782 Mixed hyperlipidemia: Secondary | ICD-10-CM | POA: Diagnosis not present

## 2017-01-03 DIAGNOSIS — I10 Essential (primary) hypertension: Secondary | ICD-10-CM | POA: Diagnosis not present

## 2017-01-03 DIAGNOSIS — R55 Syncope and collapse: Secondary | ICD-10-CM | POA: Diagnosis not present

## 2017-01-03 NOTE — Progress Notes (Signed)
Cardiology Office Note   Date:  01/03/2017   ID:  Drew [name], DOB 1956/05/29, MRN [id number]  PCP:  [name], DO    Chief Complaint  Patient presents with  . Follow-up     Wt Readings from Last 3 Encounters:  01/03/17 152 lb (68.9 kg)  11/29/16 150 lb 12.8 oz (68.4 kg)  11/28/16 145 lb (65.8 kg)       History of Present Illness: Drew [name] is a 61 y.o. male who has HTN and hyperlipidemia.  He was getting sick from a viral illness and had a syncopal episode at work after  Taking Nyquill.  He has had  A head CT which was negative.  He was standing and talking to a coworker. He felt lightheaded and the next thing he remembers was being on the floor. No incontinence or seizure activity was reported. He does remember a warning of lightheadedness.  Due to a family h/o CAD, he is referred here.  Father and mother had heart issues.  Siblings without any early CAD.    Patient has had HTN for a few years.  High cholesterol for a few years as well.    His most physical activity is walking.  He walks daily and has no CP or SHOB.  No lightheadedness or syncope since the above episode.    Overall, he feels quite well. Generally, he is in good health. Since getting over his viral illness, he has not had any problems.    Past Medical History:  Diagnosis Date  . Hypertension     Past Surgical History:  Procedure Laterality Date  . HERNIA REPAIR Left 2004     Current Outpatient Prescriptions  Medication Sig Dispense Refill  . Flaxseed, Linseed, (FLAXSEED OIL) 1200 MG CAPS Take 2 capsules by mouth daily.    Marland Kitchen lisinopril-hydrochlorothiazide (PRINZIDE,ZESTORETIC) 20-12.5 MG tablet Take 2 tablets by mouth daily. 60 tablet 5  . Omega-3 Fatty Acids (FISH OIL) 1200 MG CAPS Take by mouth daily.    . pravastatin (PRAVACHOL) 20 MG tablet TAKE ONE TABLET BY MOUTH ONCE DAILY 30 tablet 2   No current facility-administered medications for this visit.     Allergies:    Patient has no known allergies.    Social History:  The patient  reports that he quit smoking about 28 years ago. His smoking use included Cigarettes. He has a 15.00 pack-year smoking history. His smokeless tobacco use includes Chew. He reports that he does not drink alcohol or use drugs.   Family History:  The patient's family history includes Graves' disease in his mother; Heart disease in his father, mother, paternal uncle, paternal uncle, paternal uncle, paternal uncle, paternal uncle, and paternal uncle; Hyperlipidemia in his brother; Hypertension in his brother and brother. No history of sudden cardiac death.   ROS:  Please see the history of present illness.   Otherwise, review of systems are positive for syncope 1 month ago.   All other systems are reviewed and negative.    PHYSICAL EXAM: VS:  BP 122/80   Pulse 77   Ht 5\' 6"  (1.676 m)   Wt 152 lb (68.9 kg)   SpO2 96%   BMI 24.53 kg/m  , BMI Body mass index is 24.53 kg/m. GEN: Well nourished, well developed, in no acute distress  HEENT: normal  Neck: no JVD, carotid bruits, or masses Cardiac: RRR; no murmurs, rubs, or gallops,no edema  Respiratory:  clear to auscultation bilaterally, normal  work of breathing GI: soft, nontender, nondistended, + BS MS: no deformity or atrophy  Skin: warm and dry, no rash Neuro:  Strength and sensation are intact Psych: euthymic mood, full affect   EKG:   The ekg ordered 11/29/2016 demonstrates normal sinus rhythm, rSR', no ST segment changes   Recent Labs: 11/28/2016: Hemoglobin 15.1; Platelets 134 12/04/2016: ALT 32; BUN 7; Creatinine, Ser 0.82; Potassium 3.0; Sodium 140   Lipid Panel    Component Value Date/Time   CHOL 177 07/19/2016 1006   TRIG 54.0 07/19/2016 1006   HDL 52.30 07/19/2016 1006   CHOLHDL 3 07/19/2016 1006   VLDL 10.8 07/19/2016 1006   LDLCALC 114 (H) 07/19/2016 1006     Other studies Reviewed: Additional studies/ records that were reviewed today with results  demonstrating: prior ECGs.   ASSESSMENT AND PLAN:  1. Syncope: I think this likely multifactorial. He may been somewhat dehydrated plus he had a viral illness. He also had taken some over-the-counter cold medicines which could cause drowsiness. Cardiac exam is normal. No symptoms of cardiac disease. No chest discomfort, shortness of breath, palpitations or any further lightheadedness. He is very active at work. He walks in his neighborhood daily. Will not plan for any cardiac testing at this time. If he develops any symptoms, could reconsider stress testing or ultrasound. 2. Hypertension: Blood pressure well controlled. Continue current medications. 3. Hyperlipidemia: Continue current lipid-lowering therapy. Continue healthy diet as well. 4. He will follow-up with [place] as needed depending on if he has any cardiac symptoms. Continue aggressive preventative therapy.   Current medicines are reviewed at length with the patient today.  The patient concerns regarding his medicines were addressed.  The following changes have been made:  No change  Labs/ tests ordered today include:  No orders of the defined types were placed in this encounter.   Recommend 150 minutes/week of aerobic exercise Low fat, low carb, high fiber diet recommended  Disposition:   FU in prn   Signed, Larae Grooms, MD  01/03/2017 3:42 PM    [facility] [place], [place], [place]  [address] Phone: [phone]; Fax: [phone]

## 2017-01-03 NOTE — Patient Instructions (Signed)
Medication Instructions:  Same-no changes  Labwork: None  Testing/Procedures: None  Follow-Up: As needed     If you need a refill on your cardiac medications before your next appointment, please call your pharmacy.   

## 2017-01-24 ENCOUNTER — Encounter: Payer: Self-pay | Admitting: Family Medicine

## 2017-01-24 ENCOUNTER — Other Ambulatory Visit: Payer: Self-pay | Admitting: Family Medicine

## 2017-01-24 ENCOUNTER — Ambulatory Visit (INDEPENDENT_AMBULATORY_CARE_PROVIDER_SITE_OTHER): Payer: 59 | Admitting: Family Medicine

## 2017-01-24 VITALS — BP 126/86 | HR 64 | Temp 98.2°F | Resp 16 | Ht 66.0 in | Wt 152.0 lb

## 2017-01-24 DIAGNOSIS — Z1159 Encounter for screening for other viral diseases: Secondary | ICD-10-CM

## 2017-01-24 DIAGNOSIS — I10 Essential (primary) hypertension: Secondary | ICD-10-CM

## 2017-01-24 DIAGNOSIS — E782 Mixed hyperlipidemia: Secondary | ICD-10-CM | POA: Diagnosis not present

## 2017-01-24 LAB — COMPREHENSIVE METABOLIC PANEL
ALK PHOS: 59 U/L (ref 39–117)
ALT: 15 U/L (ref 0–53)
AST: 18 U/L (ref 0–37)
Albumin: 3.9 g/dL (ref 3.5–5.2)
BILIRUBIN TOTAL: 1.1 mg/dL (ref 0.2–1.2)
BUN: 9 mg/dL (ref 6–23)
CO2: 32 mEq/L (ref 19–32)
Calcium: 9.3 mg/dL (ref 8.4–10.5)
Chloride: 102 mEq/L (ref 96–112)
Creatinine, Ser: 0.85 mg/dL (ref 0.40–1.50)
GFR: 97.17 mL/min (ref >60.00)
GLUCOSE: 96 mg/dL (ref 70–99)
Potassium: 3.7 mEq/L (ref 3.5–5.1)
SODIUM: 139 meq/L (ref 135–145)
TOTAL PROTEIN: 6.6 g/dL (ref 6.0–8.3)

## 2017-01-24 LAB — LIPID PANEL
Cholesterol: 158 mg/dL (ref 0–200)
HDL: 49 mg/dL (ref >39.00)
LDL Cholesterol: 96 mg/dL (ref 0–99)
NONHDL: 108.73
Total CHOL/HDL Ratio: 3
Triglycerides: 65 mg/dL (ref 0.0–149.0)
VLDL: 13 mg/dL (ref 0.0–40.0)

## 2017-01-24 LAB — HEPATITIS C ANTIBODY: HCV AB: NEGATIVE

## 2017-01-24 MED ORDER — PRAVASTATIN SODIUM 20 MG PO TABS: 20.0000 mg | ORAL_TABLET | Freq: Every day | ORAL | 1 refills | Status: DC

## 2017-01-24 MED ORDER — LISINOPRIL-HYDROCHLOROTHIAZIDE 20-12.5 MG PO TABS: 2.0000 | ORAL_TABLET | Freq: Every day | ORAL | 1 refills | Status: DC

## 2017-01-24 NOTE — Progress Notes (Signed)
Pre visit review using our clinic review tool, if applicable. No additional management support is needed unless otherwise documented below in the visit note. 

## 2017-01-24 NOTE — Progress Notes (Signed)
Patient ID: Drew [name], male   DOB: 06-01-1956, 61 y.o.   MRN: [id number]  I acted as a Education administrator for Dr. Carollee Herter.  Drew [name], CMA     Subjective:    Patient ID: Drew [name], male    DOB: [birth year], 61 y.o.   MRN: [id number]  Chief Complaint  Patient presents with  . Follow-up  . Hypertension  . Hyperlipidemia    Hypertension  This is a chronic problem. The current episode started more than 1 year ago. The problem is controlled. Pertinent negatives include no blurred vision, chest pain, headaches, malaise/fatigue, palpitations or shortness of breath. There are no associated agents to hypertension. Past treatments include ACE inhibitors. The current treatment provides significant improvement. There are no compliance problems.   Hyperlipidemia  This is a chronic problem. The current episode started more than 1 year ago. The problem is controlled. Pertinent negatives include no chest pain or shortness of breath. Current antihyperlipidemic treatment includes statins. The current treatment provides significant improvement of lipids. There are no compliance problems.     Patient is in today for follow up blood pressure and cholesterol.   Past Medical History:  Diagnosis Date  . Hypertension     Past Surgical History:  Procedure Laterality Date  . HERNIA REPAIR Left 2004    Family History  Problem Relation Age of Onset  . Heart disease Mother     Duanne Limerick  . Berenice Primas' disease Mother   . Heart disease Father   . Hyperlipidemia Brother   . Hypertension Brother   . Heart disease Paternal Uncle   . Heart disease Paternal Uncle   . Heart disease Paternal Uncle   . Heart disease Paternal Uncle   . Heart disease Paternal Uncle   . Heart disease Paternal Uncle   . Hypertension Brother   . Colon cancer Neg Hx     Social History   Social History  . Marital status: Married    Spouse name: N/A  . Number of children: N/A  . Years of education: N/A   Occupational History  . Not  on file.   Social History Main Topics  . Smoking status: Former Smoker    Packs/day: 1.00    Years: 15.00    Types: Cigarettes    Quit date: 01/19/1988  . Smokeless tobacco: Current User    Types: Chew  . Alcohol use No  . Drug use: No  . Sexual activity: Yes   Other Topics Concern  . Not on file   Social History Narrative   Exercise-walking       Outpatient Medications Prior to Visit  Medication Sig Dispense Refill  . Flaxseed, Linseed, (FLAXSEED OIL) 1200 MG CAPS Take 2 capsules by mouth daily.    . Omega-3 Fatty Acids (FISH OIL) 1200 MG CAPS Take by mouth daily.    Marland Kitchen lisinopril-hydrochlorothiazide (PRINZIDE,ZESTORETIC) 20-12.5 MG tablet Take 2 tablets by mouth daily. 60 tablet 5  . pravastatin (PRAVACHOL) 20 MG tablet TAKE ONE TABLET BY MOUTH ONCE DAILY 30 tablet 2   No facility-administered medications prior to visit.     No Known Allergies  Review of Systems  Constitutional: Negative for fever and malaise/fatigue.  HENT: Negative for congestion.   Eyes: Negative for blurred vision.  Respiratory: Negative for cough and shortness of breath.   Cardiovascular: Negative for chest pain, palpitations and leg swelling.  Gastrointestinal: Negative for vomiting.  Musculoskeletal: Negative for back pain.  Skin: Negative for rash.  Neurological: Negative for loss  of consciousness and headaches.       Objective:    Physical Exam  Constitutional: He appears well-developed and well-nourished. No distress.  HENT:  Head: Normocephalic and atraumatic.  Eyes: Conjunctivae are normal.  Neck: Normal range of motion. No thyromegaly present.  Cardiovascular: Normal rate and regular rhythm.   Pulmonary/Chest: Effort normal. He has no wheezes.  Abdominal: Soft. Bowel sounds are normal. There is no tenderness.  Musculoskeletal: Normal range of motion. He exhibits no edema or deformity.  Neurological: He is alert.  Skin: Skin is warm and dry. He is not diaphoretic.  Psychiatric:  He has a normal mood and affect.    BP 126/86 (BP Location: Left Arm, Cuff Size: Normal)   Pulse 64   Temp 98.2 F (36.8 C) (Oral)   Resp 16   Ht 5\' 6"  (1.676 m)   Wt 152 lb (68.9 kg)   SpO2 97%   BMI 24.53 kg/m  Wt Readings from Last 3 Encounters:  01/24/17 152 lb (68.9 kg)  01/03/17 152 lb (68.9 kg)  11/29/16 150 lb 12.8 oz (68.4 kg)     Lab Results  Component Value Date   WBC 6.8 11/28/2016   HGB 15.1 11/28/2016   HCT 42.9 11/28/2016   PLT 134 (L) 11/28/2016   GLUCOSE 96 01/24/2017   CHOL 158 01/24/2017   TRIG 65.0 01/24/2017   HDL 49.00 01/24/2017   LDLCALC 96 01/24/2017   ALT 15 01/24/2017   AST 18 01/24/2017   NA 139 01/24/2017   K 3.7 01/24/2017   CL 102 01/24/2017   CREATININE 0.85 01/24/2017   BUN 9 01/24/2017   CO2 32 01/24/2017   TSH 1.22 01/18/2014   PSA 1.03 01/18/2014   HGBA1C 5.8 12/04/2016    Lab Results  Component Value Date   TSH 1.22 01/18/2014   Lab Results  Component Value Date   WBC 6.8 11/28/2016   HGB 15.1 11/28/2016   HCT 42.9 11/28/2016   MCV 93.3 11/28/2016   PLT 134 (L) 11/28/2016   Lab Results  Component Value Date   NA 139 01/24/2017   K 3.7 01/24/2017   CO2 32 01/24/2017   GLUCOSE 96 01/24/2017   BUN 9 01/24/2017   CREATININE 0.85 01/24/2017   BILITOT 1.1 01/24/2017   ALKPHOS 59 01/24/2017   AST 18 01/24/2017   ALT 15 01/24/2017   PROT 6.6 01/24/2017   ALBUMIN 3.9 01/24/2017   CALCIUM 9.3 01/24/2017   ANIONGAP 7 11/28/2016   GFR 97.17 01/24/2017   Lab Results  Component Value Date   CHOL 158 01/24/2017   Lab Results  Component Value Date   HDL 49.00 01/24/2017   Lab Results  Component Value Date   LDLCALC 96 01/24/2017   Lab Results  Component Value Date   TRIG 65.0 01/24/2017   Lab Results  Component Value Date   CHOLHDL 3 01/24/2017   Lab Results  Component Value Date   HGBA1C 5.8 12/04/2016       Assessment & Plan:   Problem List Items Addressed This Visit      Unprioritized    Hyperlipidemia - Primary   Relevant Medications   pravastatin (PRAVACHOL) 20 MG tablet   lisinopril-hydrochlorothiazide (PRINZIDE,ZESTORETIC) 20-12.5 MG tablet   Other Relevant Orders   Lipid panel (Completed)   HTN (hypertension)    Stable  con't lisinopril hct       Relevant Medications   pravastatin (PRAVACHOL) 20 MG tablet   lisinopril-hydrochlorothiazide (PRINZIDE,ZESTORETIC) 20-12.5 MG tablet  Other Relevant Orders   Comprehensive metabolic panel (Completed)    Other Visit Diagnoses    Encounter for hepatitis C screening test for low risk patient       Relevant Orders   Hepatitis C antibody (Completed)      I have changed Mr. Probus pravastatin. I am also having him maintain his Flaxseed Oil, Fish Oil, and lisinopril-hydrochlorothiazide.  Meds ordered this encounter  Medications  . pravastatin (PRAVACHOL) 20 MG tablet    Sig: Take 1 tablet (20 mg total) by mouth daily.    Dispense:  90 tablet    Refill:  1    Please consider 90 day supplies to promote better adherence  . lisinopril-hydrochlorothiazide (PRINZIDE,ZESTORETIC) 20-12.5 MG tablet    Sig: Take 2 tablets by mouth daily.    Dispense:  180 tablet    Refill:  1    CMA served as scribe during this visit. History, Physical and Plan performed by medical provider. Documentation and orders reviewed and attested to.  [name], DO

## 2017-01-24 NOTE — Patient Instructions (Signed)
Hypertension Hypertension, commonly called high blood pressure, is when the force of blood pumping through your arteries is too strong. Your arteries are the blood vessels that carry blood from your heart throughout your body. A blood pressure reading consists of a higher number over a lower number, such as 110/72. The higher number (systolic) is the pressure inside your arteries when your heart pumps. The lower number (diastolic) is the pressure inside your arteries when your heart relaxes. Ideally you want your blood pressure below 120/80. Hypertension forces your heart to work harder to pump blood. Your arteries may become narrow or stiff. Having untreated or uncontrolled hypertension can cause heart attack, stroke, kidney disease, and other problems. What increases the risk? Some risk factors for high blood pressure are controllable. Others are not. Risk factors you cannot control include:  Race. You may be at higher risk if you are African American.  Age. Risk increases with age.  Gender. Men are at higher risk than women before age 45 years. After age 65, women are at higher risk than men. Risk factors you can control include:  Not getting enough exercise or physical activity.  Being overweight.  Getting too much fat, sugar, calories, or salt in your diet.  Drinking too much alcohol. What are the signs or symptoms? Hypertension does not usually cause signs or symptoms. Extremely high blood pressure (hypertensive crisis) may cause headache, anxiety, shortness of breath, and nosebleed. How is this diagnosed? To check if you have hypertension, your health care provider will measure your blood pressure while you are seated, with your arm held at the level of your heart. It should be measured at least twice using the same arm. Certain conditions can cause a difference in blood pressure between your right and left arms. A blood pressure reading that is higher than normal on one occasion does  not mean that you need treatment. If it is not clear whether you have high blood pressure, you may be asked to return on a different day to have your blood pressure checked again. Or, you may be asked to monitor your blood pressure at home for 1 or more weeks. How is this treated? Treating high blood pressure includes making lifestyle changes and possibly taking medicine. Living a healthy lifestyle can help lower high blood pressure. You may need to change some of your habits. Lifestyle changes may include:  Following the DASH diet. This diet is high in fruits, vegetables, and whole grains. It is low in salt, red meat, and added sugars.  Keep your sodium intake below 2,300 mg per day.  Getting at least 30-45 minutes of aerobic exercise at least 4 times per week.  Losing weight if necessary.  Not smoking.  Limiting alcoholic beverages.  Learning ways to reduce stress. Your health care provider may prescribe medicine if lifestyle changes are not enough to get your blood pressure under control, and if one of the following is true:  You are 18-59 years of age and your systolic blood pressure is above 140.  You are 60 years of age or older, and your systolic blood pressure is above 150.  Your diastolic blood pressure is above 90.  You have diabetes, and your systolic blood pressure is over 140 or your diastolic blood pressure is over 90.  You have kidney disease and your blood pressure is above 140/90.  You have heart disease and your blood pressure is above 140/90. Your personal target blood pressure may vary depending on your medical   conditions, your age, and other factors. Follow these instructions at home:  Have your blood pressure rechecked as directed by your health care provider.  Take medicines only as directed by your health care provider. Follow the directions carefully. Blood pressure medicines must be taken as prescribed. The medicine does not work as well when you skip  doses. Skipping doses also puts you at risk for problems.  Do not smoke.  Monitor your blood pressure at home as directed by your health care provider. Contact a health care provider if:  You think you are having a reaction to medicines taken.  You have recurrent headaches or feel dizzy.  You have swelling in your ankles.  You have trouble with your vision. Get help right away if:  You develop a severe headache or confusion.  You have unusual weakness, numbness, or feel faint.  You have severe chest or abdominal pain.  You vomit repeatedly.  You have trouble breathing. This information is not intended to replace advice given to you by your health care provider. Make sure you discuss any questions you have with your health care provider. Document Released: 11/26/2005 Document Revised: 05/03/2016 Document Reviewed: 09/18/2013 Elsevier Interactive Patient Education  2017 [employer]  

## 2017-01-27 NOTE — Assessment & Plan Note (Signed)
Stable con't lisinopril hct  

## 2017-03-18 ENCOUNTER — Inpatient Hospital Stay (HOSPITAL_BASED_OUTPATIENT_CLINIC_OR_DEPARTMENT_OTHER)
Admission: EM | Admit: 2017-03-18 | Discharge: 2017-03-20 | DRG: 066 | Disposition: A | Discharge status: Home / Self Care | Payer: 59 | Attending: Internal Medicine | Admitting: Internal Medicine

## 2017-03-18 ENCOUNTER — Encounter (HOSPITAL_BASED_OUTPATIENT_CLINIC_OR_DEPARTMENT_OTHER): Payer: Self-pay | Admitting: Emergency Medicine

## 2017-03-18 ENCOUNTER — Emergency Department (HOSPITAL_BASED_OUTPATIENT_CLINIC_OR_DEPARTMENT_OTHER): Payer: 59

## 2017-03-18 ENCOUNTER — Observation Stay (HOSPITAL_COMMUNITY): Payer: 59

## 2017-03-18 DIAGNOSIS — R410 Disorientation, unspecified: Secondary | ICD-10-CM | POA: Diagnosis not present

## 2017-03-18 DIAGNOSIS — R4182 Altered mental status, unspecified: Secondary | ICD-10-CM | POA: Diagnosis not present

## 2017-03-18 DIAGNOSIS — G459 Transient cerebral ischemic attack, unspecified: Secondary | ICD-10-CM

## 2017-03-18 DIAGNOSIS — G9340 Encephalopathy, unspecified: Secondary | ICD-10-CM | POA: Diagnosis not present

## 2017-03-18 DIAGNOSIS — Z8249 Family history of ischemic heart disease and other diseases of the circulatory system: Secondary | ICD-10-CM

## 2017-03-18 DIAGNOSIS — I10 Essential (primary) hypertension: Secondary | ICD-10-CM | POA: Diagnosis present

## 2017-03-18 DIAGNOSIS — F17220 Nicotine dependence, chewing tobacco, uncomplicated: Secondary | ICD-10-CM | POA: Diagnosis present

## 2017-03-18 DIAGNOSIS — R739 Hyperglycemia, unspecified: Secondary | ICD-10-CM | POA: Diagnosis present

## 2017-03-18 DIAGNOSIS — R413 Other amnesia: Secondary | ICD-10-CM

## 2017-03-18 DIAGNOSIS — E782 Mixed hyperlipidemia: Secondary | ICD-10-CM

## 2017-03-18 DIAGNOSIS — I639 Cerebral infarction, unspecified: Principal | ICD-10-CM | POA: Diagnosis present

## 2017-03-18 DIAGNOSIS — I083 Combined rheumatic disorders of mitral, aortic and tricuspid valves: Secondary | ICD-10-CM | POA: Diagnosis present

## 2017-03-18 DIAGNOSIS — G454 Transient global amnesia: Secondary | ICD-10-CM | POA: Diagnosis present

## 2017-03-18 DIAGNOSIS — E7800 Pure hypercholesterolemia, unspecified: Secondary | ICD-10-CM | POA: Diagnosis present

## 2017-03-18 DIAGNOSIS — E785 Hyperlipidemia, unspecified: Secondary | ICD-10-CM | POA: Diagnosis present

## 2017-03-18 HISTORY — DX: Pure hypercholesterolemia, unspecified: E78.00

## 2017-03-18 LAB — URINALYSIS, ROUTINE W REFLEX MICROSCOPIC
BILIRUBIN URINE: NEGATIVE
GLUCOSE, UA: NEGATIVE mg/dL
Hgb urine dipstick: NEGATIVE
Ketones, ur: NEGATIVE mg/dL
LEUKOCYTES UA: NEGATIVE
NITRITE: NEGATIVE
PH: 7.5 (ref 5.0–8.0)
Protein, ur: NEGATIVE mg/dL
Specific Gravity, Urine: 1.004 — ABNORMAL LOW (ref 1.005–1.030)

## 2017-03-18 LAB — CBC WITH DIFFERENTIAL/PLATELET
Basophils Absolute: 0 10*3/uL (ref 0.0–0.1)
Basophils Relative: 0 %
EOS ABS: 0.2 10*3/uL (ref 0.0–0.7)
EOS PCT: 2 %
HCT: 47.9 % (ref 39.0–52.0)
HEMOGLOBIN: 17.3 g/dL — AB (ref 13.0–17.0)
LYMPHS ABS: 1.2 10*3/uL (ref 0.7–4.0)
LYMPHS PCT: 11 %
MCH: 32.9 pg (ref 26.0–34.0)
MCHC: 36.1 g/dL — AB (ref 30.0–36.0)
MCV: 91.1 fL (ref 78.0–100.0)
MONOS PCT: 6 %
Monocytes Absolute: 0.6 10*3/uL (ref 0.1–1.0)
NEUTROS PCT: 81 %
Neutro Abs: 8.2 10*3/uL — ABNORMAL HIGH (ref 1.7–7.7)
Platelets: 164 10*3/uL (ref 150–400)
RBC: 5.26 MIL/uL (ref 4.22–5.81)
RDW: 11.7 % (ref 11.5–15.5)
WBC: 10.1 10*3/uL (ref 4.0–10.5)

## 2017-03-18 LAB — COMPREHENSIVE METABOLIC PANEL
ALK PHOS: 65 U/L (ref 38–126)
ALT: 17 U/L (ref 17–63)
ANION GAP: 10 (ref 5–15)
AST: 21 U/L (ref 15–41)
Albumin: 4 g/dL (ref 3.5–5.0)
BUN: 9 mg/dL (ref 6–20)
CO2: 27 mmol/L (ref 22–32)
CREATININE: 0.77 mg/dL (ref 0.61–1.24)
Calcium: 9.6 mg/dL (ref 8.9–10.3)
Chloride: 103 mmol/L (ref 101–111)
GFR calc non Af Amer: >60 mL/min (ref >60)
Glucose, Bld: 103 mg/dL — ABNORMAL HIGH (ref 65–99)
Potassium: 3.6 mmol/L (ref 3.5–5.1)
Sodium: 140 mmol/L (ref 135–145)
TOTAL PROTEIN: 7.1 g/dL (ref 6.5–8.1)
Total Bilirubin: 0.7 mg/dL (ref 0.3–1.2)

## 2017-03-18 LAB — CREATININE, SERUM
CREATININE: 0.88 mg/dL (ref 0.61–1.24)
GFR calc Af Amer: >60 mL/min (ref >60)
GFR calc non Af Amer: >60 mL/min (ref >60)

## 2017-03-18 LAB — CBC
HCT: 46.1 % (ref 39.0–52.0)
Hemoglobin: 15.9 g/dL (ref 13.0–17.0)
MCH: 31.9 pg (ref 26.0–34.0)
MCHC: 34.5 g/dL (ref 30.0–36.0)
MCV: 92.6 fL (ref 78.0–100.0)
PLATELETS: 188 10*3/uL (ref 150–400)
RBC: 4.98 MIL/uL (ref 4.22–5.81)
RDW: 11.9 % (ref 11.5–15.5)
WBC: 9 10*3/uL (ref 4.0–10.5)

## 2017-03-18 LAB — TROPONIN I: Troponin I: <0.03 ng/mL (ref <0.03)

## 2017-03-18 LAB — CBG MONITORING, ED: Glucose-Capillary: 95 mg/dL (ref 65–99)

## 2017-03-18 MED ORDER — ASPIRIN 325 MG PO TABS
325.0000 mg | ORAL_TABLET | Freq: Every day | ORAL | Status: DC
  Administered 2017-03-19 – 2017-03-20 (×3): 325 mg via ORAL
  Filled 2017-03-18 (×3): qty 1

## 2017-03-18 MED ORDER — SODIUM CHLORIDE 0.9 % IV SOLN
INTRAVENOUS | Status: DC
  Administered 2017-03-18: 19:00:00 via INTRAVENOUS
  Administered 2017-03-19: 75 mL/h via INTRAVENOUS
  Administered 2017-03-20: 03:00:00 via INTRAVENOUS

## 2017-03-18 MED ORDER — ACETAMINOPHEN 325 MG PO TABS: 650.0000 mg | ORAL_TABLET | ORAL | Status: DC | PRN

## 2017-03-18 MED ORDER — ACETAMINOPHEN 160 MG/5ML PO SOLN: 650.0000 mg | ORAL | Status: DC | PRN

## 2017-03-18 MED ORDER — PRAVASTATIN SODIUM 20 MG PO TABS
20.0000 mg | ORAL_TABLET | Freq: Every day | ORAL | Status: DC
  Administered 2017-03-19 – 2017-03-20 (×2): 20 mg via ORAL
  Filled 2017-03-18 (×2): qty 1

## 2017-03-18 MED ORDER — ACETAMINOPHEN 650 MG RE SUPP: 650.0000 mg | RECTAL | Status: DC | PRN

## 2017-03-18 MED ORDER — ENOXAPARIN SODIUM 40 MG/0.4ML ~~LOC~~ SOLN
40.0000 mg | SUBCUTANEOUS | Status: DC
  Administered 2017-03-18 – 2017-03-20 (×3): 40 mg via SUBCUTANEOUS
  Filled 2017-03-18 (×3): qty 0.4

## 2017-03-18 MED ORDER — ASPIRIN 300 MG RE SUPP: 300.0000 mg | Freq: Every day | RECTAL | Status: DC

## 2017-03-18 MED ORDER — STROKE: EARLY STAGES OF RECOVERY BOOK
Freq: Once | Status: AC
  Administered 2017-03-19: 01:00:00
  Filled 2017-03-18: qty 1

## 2017-03-18 MED ORDER — SENNOSIDES-DOCUSATE SODIUM 8.6-50 MG PO TABS: 1.0000 | ORAL_TABLET | Freq: Every evening | ORAL | Status: DC | PRN

## 2017-03-18 NOTE — Progress Notes (Signed)
Pt arrived at the at 1740 via wheelchair.

## 2017-03-18 NOTE — Progress Notes (Signed)
   Patient coming from [facility] for treatment of AMS.   Patient presented with one-day history of acute encephalopathy more specifically defined as amnesia. Patient sees asked repetitive questions despite continued answering. Patient able to perform many of his ADLs and even drove himself to work prior to coming to [employer] for evaluation. Patient is described as being confused but capable following basic commands. Workup thus for fairly unremarkable with a normal CT and no evidence of infectious process, seizure type activity, metabolic etiology or medication induced symptomatology. Neurology consult said by EDP who recommends transfer to [facility], for further evaluation and treatment including MRI, EEG and formal neurological consultation and evaluation. Patient accepted to [facility] service under telemetry bed for observation status.   Linna Darner, MD [facility] 03/18/2017, 12:14 PM

## 2017-03-18 NOTE — Progress Notes (Signed)
Drew [name] is a 61 y.o. male patient admitted from [facility]  awake, alert - oriented  X 4 - no acute distress noted.  VSS - Blood pressure 115/84, pulse 76, temperature 97.8 F (36.6 C), temperature source Oral, resp. rate 18, height 5\' 6"  (1.676 m), weight 68 kg (150 lb), SpO2 99 %.    IV in place, occlusive dsg intact without redness.  Orientation to room, and floor completed with information packet given to patient/family.  Patient declined safety video at this time.  Admission INP armband ID verified with patient/family, and in place.   SR up x 2, fall assessment complete, with patient and family able to verbalize understanding of risk associated with falls, and verbalized understanding to call nsg before up out of bed.  Call light within reach, patient able to voice, and demonstrate understanding.  Skin, clean-dry- intact without evidence of bruising, or skin tears.   No evidence of skin break down noted on exam. Rash, and mole about a penny size  noted on the back right side.   Will cont to eval and treat per MD orders.  Dorris Carnes, RN 03/18/2017 5:47 PM

## 2017-03-18 NOTE — H&P (Signed)
History and Physical    Drew [name] XVQ:[id number] DOB: March 09, 1956 DOA: 03/18/2017  Referring MD/NP/PA: EDP PCP:  Patient coming from: [place]  Chief Complaint: not acting right  HPI: Drew [name] is a 61 y.o. male with medical history significant of HTN, Dyslipidemia presents to the ER with confusion and memory loss that lasted 2 hours. Pt recalls having a normal morning including waking up, taking his BP/cholesterol meds, going for a walk then ate breakfast and went to work, then cannot recall anything after that for about 2 hours, his boss called his wife saying that he wasn't acting right, was repeating things and seemed confused, he then drove him home at this time patient felt back to normal but couldn't recall anything that happened in the 2 hours he was at work. ED Course: Labs notable for Hb of 17, rest of labs including, CBG, CT head and EKG were unremarkable, EDP d/w NEuro on call who recommended MRI Brain, since this couldn't be done in [place] ER he was transferred to floor for observation and workup  Review of Systems: As per HPI otherwise 10 point review of systems negative.   Past Medical History:  Diagnosis Date  . High cholesterol   . Hypertension     Past Surgical History:  Procedure Laterality Date  . HERNIA REPAIR Left 2004     reports that he quit smoking about 29 years ago. His smoking use included Cigarettes. He has a 15.00 pack-year smoking history. His smokeless tobacco use includes Chew. He reports that he does not drink alcohol or use drugs.  No Known Allergies  Family History  Problem Relation Age of Onset  . Heart disease Mother     Duanne Limerick  . Berenice Primas' disease Mother   . Heart disease Father   . Hyperlipidemia Brother   . Hypertension Brother   . Heart disease Paternal Uncle   . Heart disease Paternal Uncle   . Heart disease Paternal Uncle   . Heart disease Paternal Uncle   . Heart disease Paternal Uncle   . Heart disease Paternal Uncle    . Hypertension Brother   . Colon cancer Neg Hx      Prior to Admission medications   Medication Sig Start Date End Date Taking? Authorizing Provider  Flaxseed, Linseed, (FLAXSEED OIL) 1200 MG CAPS Take 2 capsules by mouth daily.   Yes Historical Provider, MD  lisinopril-hydrochlorothiazide (PRINZIDE,ZESTORETIC) 20-12.5 MG tablet Take 2 tablets by mouth daily. 01/24/17  Yes Yvonne R Lowne Chase, DO  lisinopril-hydrochlorothiazide (PRINZIDE,ZESTORETIC) 20-12.5 MG tablet TAKE TWO TABLETS BY MOUTH ONCE DAILY 01/24/17  Yes Yvonne R Lowne Chase, DO  Omega-3 Fatty Acids (FISH OIL) 1200 MG CAPS Take by mouth daily.   Yes Historical Provider, MD  pravastatin (PRAVACHOL) 20 MG tablet Take 1 tablet (20 mg total) by mouth daily. 01/24/17  Yes Yvonne R Lowne Chase, DO  pravastatin (PRAVACHOL) 20 MG tablet TAKE ONE TABLET BY MOUTH ONCE DAILY 01/24/17  Yes [name], DO    Physical Exam: Vitals:   03/18/17 1520 03/18/17 1530 03/18/17 1630 03/18/17 1743  BP: 119/81 125/81 120/80 115/84  Pulse: 70 65 69 76  Resp: 20 18 16 18   Temp:    97.8 F (36.6 C)  TempSrc:    Oral  SpO2: 98% 97% 99% 99%  Weight:    65.8 kg (145 lb)  Height:    5\' 7"  (1.702 m)      Constitutional: NAD, calm, comfortable  Vitals:   03/18/17 1520 03/18/17 1530 03/18/17 1630 03/18/17 1743  BP: 119/81 125/81 120/80 115/84  Pulse: 70 65 69 76  Resp: 20 18 16 18   Temp:    97.8 F (36.6 C)  TempSrc:    Oral  SpO2: 98% 97% 99% 99%  Weight:    65.8 kg (145 lb)  Height:    5\' 7"  (1.702 m)   Eyes: PERRL, lids and conjunctivae normal ENMT: Mucous membranes are moist. .Normal dentition.  Neck: normal, supple, Respiratory: clear to auscultation bilaterallyNormal respiratory effort. Cardiovascular: Regular rate and rhythm, no murmurs / rubs / gallops Abdomen: no tenderness, no masses palpated. No hepatosplenomegaly. Bowel sounds positive.  Musculoskeletal: no clubbing / cyanosis. Skin: no rashes, lesions, ulcers. No  induration Neurologic: CN 2-12 grossly intact. Sensation intact, DTR normal. Strength 5/5 in all 4.  Psychiatric: Normal judgment and insight. Alert and oriented x 3. Normal mood.   Labs on Admission: I have personally reviewed following labs and imaging studies  CBC:  Recent Labs Lab 03/18/17 0923  WBC 10.1  NEUTROABS 8.2*  HGB 17.3*  HCT 47.9  MCV 91.1  PLT 725   Basic Metabolic Panel:  Recent Labs Lab 03/18/17 0923  NA 140  K 3.6  CL 103  CO2 27  GLUCOSE 103*  BUN 9  CREATININE 0.77  CALCIUM 9.6   GFR: Estimated Creatinine Clearance: 90.2 mL/min (by C-G formula based on SCr of 0.77 mg/dL). Liver Function Tests:  Recent Labs Lab 03/18/17 0923  AST 21  ALT 17  ALKPHOS 65  BILITOT 0.7  PROT 7.1  ALBUMIN 4.0   No results for input(s): LIPASE, AMYLASE in the last 168 hours. No results for input(s): AMMONIA in the last 168 hours. Coagulation Profile: No results for input(s): INR, PROTIME in the last 168 hours. Cardiac Enzymes:  Recent Labs Lab 03/18/17 0923  TROPONINI <0.03   BNP (last 3 results) No results for input(s): PROBNP in the last 8760 hours. HbA1C: No results for input(s): HGBA1C in the last 72 hours. CBG:  Recent Labs Lab 03/18/17 0915  GLUCAP 95   Lipid Profile: No results for input(s): CHOL, HDL, LDLCALC, TRIG, CHOLHDL, LDLDIRECT in the last 72 hours. Thyroid Function Tests: No results for input(s): TSH, T4TOTAL, FREET4, T3FREE, THYROIDAB in the last 72 hours. Anemia Panel: No results for input(s): VITAMINB12, FOLATE, FERRITIN, TIBC, IRON, RETICCTPCT in the last 72 hours. Urine analysis:    Component Value Date/Time   COLORURINE YELLOW 03/18/2017 1000   APPEARANCEUR CLEAR 03/18/2017 1000   LABSPEC 1.004 (L) 03/18/2017 1000   PHURINE 7.5 03/18/2017 1000   GLUCOSEU NEGATIVE 03/18/2017 1000   HGBUR NEGATIVE 03/18/2017 1000   BILIRUBINUR NEGATIVE 03/18/2017 1000   BILIRUBINUR Neg 01/18/2014 1651   KETONESUR NEGATIVE 03/18/2017  1000   PROTEINUR NEGATIVE 03/18/2017 1000   UROBILINOGEN 0.2 01/18/2014 1651   NITRITE NEGATIVE 03/18/2017 1000   LEUKOCYTESUR NEGATIVE 03/18/2017 1000   Sepsis Labs: @LABRCNTIP (procalcitonin:4,lacticidven:4) )No results found for this or any previous visit (from the past 240 hour(s)).   Radiological Exams on Admission: Ct Head Wo Contrast  Result Date: 03/18/2017 CLINICAL DATA:  Confusion, memory issues starting this morning EXAM: CT HEAD WITHOUT CONTRAST TECHNIQUE: Contiguous axial images were obtained from the base of the skull through the vertex without intravenous contrast. COMPARISON:  12/04/2016 FINDINGS: Brain: No intracranial hemorrhage, mass effect or midline shift. No definite acute cortical infarction. Ventricular size is stable from prior exam. No mass lesion is noted on this unenhanced scan.  Vascular: Mild atherosclerotic calcifications of carotid siphon Skull: No skull fracture is noted. Sinuses/Orbits: Mucosal thickening with partial opacification bilateral ethmoid air cells. Mucosal thickening bilateral sphenoid sinuses. Mild mucosal thickening maxillary sinuses. Other: None IMPRESSION: No acute intracranial abnormality. No definite acute cortical infarction. Paranasal sinuses disease as described above. Electronically Signed   By: Lahoma Crocker M.D.   On: 03/18/2017 09:47    EKG: Independently reviewed. NSR, no acute ST T wave changes  Assessment/Plan Active Problems:  Transient amnesia -improved, back to baseline -etiology unclear, ? TIA vs Transient Global amnesia -d/w Neuro recommended MRI Brain, NEuro will FU in am, d/w Dr.Dakakni -further workup depending on MRI Brain results, may need EEG too -ASA 325mg  daily -gentle IVf for 12 hours, slightly hemo concentrated, could be slightly dehydrated from diuretics too   HTN -hold antihypertensives   Dyslipidemia  -resume statin  Mild hyperglycemia -check Hba1c  DVT prophylaxis: lovenox Code Status: Full Code  Family  Communication: none at bedside  Disposition Plan: home tomorrow if stable Consults called: d/w Neuro Dr.Dakakni  Admission status: observation  Domenic Polite MD [facility] Pager [phone]  If 7PM-7AM, please contact night-coverage [email or web address] Password [email or web address]  03/18/2017, 6:10 PM

## 2017-03-18 NOTE — ED Triage Notes (Signed)
Per wife, pt was sent home from work due to confusion, "spacing out". On the way here she states he has been asking repetitive questions. Pt denies falling, denies pain. Wife states pt was normal last night at 9:30 when he went to bed.

## 2017-03-18 NOTE — ED Provider Notes (Signed)
[facility] DEPT [facility] Provider Note   CSN: [id number] Arrival date & time: 03/18/17  0901     History   Chief Complaint Chief Complaint  Patient presents with  . Altered Mental Status    HPI YAZID POP is a 61 y.o. male.  The history is provided by the patient and medical records.  Altered Mental Status   Associated symptoms include confusion.    61 y.o. M with hx of HTN, HLP, presenting to the ED for confusion and memory issues.  Patient's wife reports yesterday patient seemed fine, went to bed at 9:30 PM and was normal.  States this morning he got up and went to work but she received a phone call around 8:30 from patient's boss saying he appeared confused and he drove him home. Patient reported to wife that he did not remember driving to work and felt confused while he was at work as if he could not figure out what was going on around him.  No other neurologic symptoms of weakness, numbness, etc.  He had no memory of being driven home until his wife told him.  Wife states on the way here he was asking repetitive questions.  Daughter also reports to wife this morning that patient seemed to be pacing around the house and kept asking "it is Monday isn't it".  On arrival to ED, patient is awake, alert, oriented. He is aware of his surroundings and what seems to be going on. He does have some memory of this morning, i.e. that he ate yogurt for breakfast, but are apparent other gaps in his memory. He has not had any recent illness or fever. He has not had any falls or head trauma. He has no history of TIA or stroke. He is not currently on anticoagulation. Wife reports about 2 weeks ago he was complaining of feeling lightheaded, however this subsided. There were no syncopal events. He was evaluated for syncope in December 2017 but with essentially negative workup. No recent changes in medications. He is not a smoker, does use chewing tobacco. He denies any chest pain, shortness of breath,  headache, dizziness, or other symptoms at present.   Past Medical History:  Diagnosis Date  . High cholesterol   . Hypertension     Patient Active Problem List   Diagnosis Date Noted  . Hyperlipidemia 07/19/2016  . HTN (hypertension) 01/18/2014    Past Surgical History:  Procedure Laterality Date  . HERNIA REPAIR Left 2004       Home Medications    Prior to Admission medications   Medication Sig Start Date End Date Taking? Authorizing Provider  Flaxseed, Linseed, (FLAXSEED OIL) 1200 MG CAPS Take 2 capsules by mouth daily.   Yes Historical Provider, MD  lisinopril-hydrochlorothiazide (PRINZIDE,ZESTORETIC) 20-12.5 MG tablet Take 2 tablets by mouth daily. 01/24/17  Yes Yvonne R Lowne Chase, DO  lisinopril-hydrochlorothiazide (PRINZIDE,ZESTORETIC) 20-12.5 MG tablet TAKE TWO TABLETS BY MOUTH ONCE DAILY 01/24/17  Yes Yvonne R Lowne Chase, DO  Omega-3 Fatty Acids (FISH OIL) 1200 MG CAPS Take by mouth daily.   Yes Historical Provider, MD  pravastatin (PRAVACHOL) 20 MG tablet Take 1 tablet (20 mg total) by mouth daily. 01/24/17  Yes Yvonne R Lowne Chase, DO  pravastatin (PRAVACHOL) 20 MG tablet TAKE ONE TABLET BY MOUTH ONCE DAILY 01/24/17  Yes [name], DO    Family History Family History  Problem Relation Age of Onset  . Heart disease Mother     chf  .  Graves' disease Mother   . Heart disease Father   . Hyperlipidemia Brother   . Hypertension Brother   . Heart disease Paternal Uncle   . Heart disease Paternal Uncle   . Heart disease Paternal Uncle   . Heart disease Paternal Uncle   . Heart disease Paternal Uncle   . Heart disease Paternal Uncle   . Hypertension Brother   . Colon cancer Neg Hx     Social History Social History  Substance Use Topics  . Smoking status: Former Smoker    Packs/day: 1.00    Years: 15.00    Types: Cigarettes    Quit date: 01/19/1988  . Smokeless tobacco: Current User    Types: Chew  . Alcohol use No     Allergies   Patient  has no known allergies.   Review of Systems Review of Systems  Psychiatric/Behavioral: Positive for confusion.       Memory issues  All other systems reviewed and are negative.    Physical Exam Updated Vital Signs BP (!) 170/87 (BP Location: Right Arm)   Pulse 67   Temp 97.6 F (36.4 C) (Oral)   Resp 18   Ht 5\' 6"  (1.676 m)   Wt 68 kg   SpO2 100%   BMI 24.21 kg/m   Physical Exam  Constitutional: He is oriented to person, place, and time. He appears well-developed and well-nourished. No distress.  HENT:  Head: Normocephalic and atraumatic.  Right Ear: External ear normal.  Left Ear: External ear normal.  Mouth/Throat: Oropharynx is clear and moist.  Eyes: Conjunctivae and EOM are normal. Pupils are equal, round, and reactive to light.  EOMs fully intact, no nystagmus, normal confrontation, no apparent field cuts  Neck: Normal range of motion and full passive range of motion without pain. Neck supple. No neck rigidity.  No rigidity, no meningismus  Cardiovascular: Normal rate, regular rhythm and normal heart sounds.   No murmur heard. Pulmonary/Chest: Effort normal and breath sounds normal. No respiratory distress. He has no wheezes. He has no rhonchi.  Abdominal: Soft. Bowel sounds are normal. There is no tenderness. There is no rebound and no guarding.  Musculoskeletal: Normal range of motion. He exhibits no edema.  Neurological: He is alert and oriented to person, place, and time. He has normal strength. He displays no tremor. No cranial nerve deficit or sensory deficit. He displays no seizure activity.  AAOx3, answering questions and following commands appropriately, issues with memory recall noted; aware of surrounding and events happening during exam, equal strength UE and LE bilaterally; CN grossly intact; moves all extremities appropriately without ataxia; normal finger-nose-finger bilaterally; speech is fluid and clear, no tremors or seizure activity  Skin: Skin is  warm and dry. No rash noted. He is not diaphoretic.  Psychiatric: He has a normal mood and affect. His behavior is normal. Thought content normal.  Nursing note and vitals reviewed.    ED Treatments / Results  Labs (all labs ordered are listed, but only abnormal results are displayed) Labs Reviewed  CBC WITH DIFFERENTIAL/PLATELET - Abnormal; Notable for the following:       Result Value   Hemoglobin 17.3 (*)    MCHC 36.1 (*)    Neutro Abs 8.2 (*)    All other components within normal limits  COMPREHENSIVE METABOLIC PANEL - Abnormal; Notable for the following:    Glucose, Bld 103 (*)    All other components within normal limits  URINALYSIS, ROUTINE W REFLEX MICROSCOPIC - Abnormal;  Notable for the following:    Specific Gravity, Urine 1.004 (*)    All other components within normal limits  TROPONIN I  CBG MONITORING, ED    EKG  EKG Interpretation  Date/Time:  Monday March 18 2017 09:17:58 EDT Ventricular Rate:  60 PR Interval:    QRS Duration: 99 QT Interval:  412 QTC Calculation: 412 R Axis:   63 Text Interpretation:  Sinus rhythm Left atrial enlargement RSR' in V1 or V2, right VCD or RVH No significant change was found Confirmed by [facility] MD, Ethel ([id number]) on 03/18/2017 9:39:06 AM       Radiology Ct Head Wo Contrast  Result Date: 03/18/2017 CLINICAL DATA:  Confusion, memory issues starting this morning EXAM: CT HEAD WITHOUT CONTRAST TECHNIQUE: Contiguous axial images were obtained from the base of the skull through the vertex without intravenous contrast. COMPARISON:  12/04/2016 FINDINGS: Brain: No intracranial hemorrhage, mass effect or midline shift. No definite acute cortical infarction. Ventricular size is stable from prior exam. No mass lesion is noted on this unenhanced scan. Vascular: Mild atherosclerotic calcifications of carotid siphon Skull: No skull fracture is noted. Sinuses/Orbits: Mucosal thickening with partial opacification bilateral ethmoid air cells.  Mucosal thickening bilateral sphenoid sinuses. Mild mucosal thickening maxillary sinuses. Other: None IMPRESSION: No acute intracranial abnormality. No definite acute cortical infarction. Paranasal sinuses disease as described above. Electronically Signed   By: Lahoma Crocker M.D.   On: 03/18/2017 09:47    Procedures Procedures (including critical care time)  Medications Ordered in ED Medications - No data to display   Initial Impression / Assessment and Plan / ED Course  I have reviewed the triage vital signs and the nursing notes.  Pertinent labs & imaging results that were available during my care of the patient were reviewed by me and considered in my medical decision making (see chart for details).  61 y.o. M here with memory issues beginning this AM.  Went to bed normal last night at 9:30PM.  This morning he was noted to be pacing around his house, asking repetitive questioning, and could not recall driving himself to work or his boss driving him home.  Here he is AAOx3, he is aware of his surroundings and remains interactive during exam.  He does have lapse in memory around the time he left for work and cannot recall anything that happened for the hour and half he was at work.  No apparent focal deficits on exam here.  VSS.  No recent signs/symptoms concerning for infectious etiology of symptoms.  Question of possible total global amnesia.  Will plan for labs, head CT.  EKG reassuring.  CBG 95.    10:08 AM Patient reassessed.  Wife states while we are out of the room he continues to have some repetitive questioning about what has been happening thus far during ED visit.  Remains without focal numbness, weakness, or other deficits. VSS.  Lab work and CT thus far reassuring.  Will discuss with neurology for recommendations.   Discussed with neurology, recommended MRI and EEG so will need observation admission.  If studies negative, may be able to discharge home tomorrow.  Will evaluate upon  [facility] to [facility].  Discussed with hospitalist, Dr. Marily Memos who will admit.  Family updated and agrees with plan of care.  Final Clinical Impressions(s) / ED Diagnoses   Final diagnoses:  Memory change    New Prescriptions New Prescriptions   No medications on file     Larene Pickett, Hershal Coria 03/18/17  [place], MD 03/21/17 [phone]

## 2017-03-19 ENCOUNTER — Inpatient Hospital Stay (HOSPITAL_COMMUNITY): Payer: 59

## 2017-03-19 ENCOUNTER — Observation Stay (HOSPITAL_COMMUNITY): Payer: 59

## 2017-03-19 DIAGNOSIS — R413 Other amnesia: Secondary | ICD-10-CM | POA: Diagnosis not present

## 2017-03-19 DIAGNOSIS — Z8249 Family history of ischemic heart disease and other diseases of the circulatory system: Secondary | ICD-10-CM | POA: Diagnosis not present

## 2017-03-19 DIAGNOSIS — F17220 Nicotine dependence, chewing tobacco, uncomplicated: Secondary | ICD-10-CM | POA: Diagnosis present

## 2017-03-19 DIAGNOSIS — I083 Combined rheumatic disorders of mitral, aortic and tricuspid valves: Secondary | ICD-10-CM | POA: Diagnosis present

## 2017-03-19 DIAGNOSIS — G459 Transient cerebral ischemic attack, unspecified: Secondary | ICD-10-CM | POA: Diagnosis not present

## 2017-03-19 DIAGNOSIS — G9340 Encephalopathy, unspecified: Secondary | ICD-10-CM | POA: Diagnosis not present

## 2017-03-19 DIAGNOSIS — I639 Cerebral infarction, unspecified: Principal | ICD-10-CM

## 2017-03-19 DIAGNOSIS — E7800 Pure hypercholesterolemia, unspecified: Secondary | ICD-10-CM | POA: Diagnosis present

## 2017-03-19 DIAGNOSIS — E785 Hyperlipidemia, unspecified: Secondary | ICD-10-CM | POA: Diagnosis not present

## 2017-03-19 DIAGNOSIS — R739 Hyperglycemia, unspecified: Secondary | ICD-10-CM | POA: Diagnosis present

## 2017-03-19 DIAGNOSIS — I6789 Other cerebrovascular disease: Secondary | ICD-10-CM | POA: Diagnosis not present

## 2017-03-19 DIAGNOSIS — R4182 Altered mental status, unspecified: Secondary | ICD-10-CM | POA: Diagnosis not present

## 2017-03-19 DIAGNOSIS — G454 Transient global amnesia: Secondary | ICD-10-CM | POA: Diagnosis present

## 2017-03-19 DIAGNOSIS — I10 Essential (primary) hypertension: Secondary | ICD-10-CM | POA: Diagnosis not present

## 2017-03-19 LAB — LIPID PANEL
CHOL/HDL RATIO: 3.8 ratio
Cholesterol: 162 mg/dL (ref 0–200)
HDL: 43 mg/dL (ref >40)
LDL CALC: 106 mg/dL — AB (ref 0–99)
TRIGLYCERIDES: 64 mg/dL (ref <150)
VLDL: 13 mg/dL (ref 0–40)

## 2017-03-19 LAB — BASIC METABOLIC PANEL
Anion gap: 8 (ref 5–15)
BUN: 8 mg/dL (ref 6–20)
CHLORIDE: 104 mmol/L (ref 101–111)
CO2: 29 mmol/L (ref 22–32)
Calcium: 9.3 mg/dL (ref 8.9–10.3)
Creatinine, Ser: 0.97 mg/dL (ref 0.61–1.24)
GFR calc Af Amer: >60 mL/min (ref >60)
GFR calc non Af Amer: >60 mL/min (ref >60)
GLUCOSE: 75 mg/dL (ref 65–99)
Potassium: 4.2 mmol/L (ref 3.5–5.1)
Sodium: 141 mmol/L (ref 135–145)

## 2017-03-19 LAB — CBC
HCT: 47.4 % (ref 39.0–52.0)
Hemoglobin: 16.2 g/dL (ref 13.0–17.0)
MCH: 31.9 pg (ref 26.0–34.0)
MCHC: 34.2 g/dL (ref 30.0–36.0)
MCV: 93.3 fL (ref 78.0–100.0)
PLATELETS: 178 10*3/uL (ref 150–400)
RBC: 5.08 MIL/uL (ref 4.22–5.81)
RDW: 11.9 % (ref 11.5–15.5)
WBC: 6.4 10*3/uL (ref 4.0–10.5)

## 2017-03-19 LAB — HIV ANTIBODY (ROUTINE TESTING W REFLEX): HIV Screen 4th Generation wRfx: NONREACTIVE

## 2017-03-19 NOTE — Evaluation (Signed)
Physical Therapy Evaluation/Discharge Patient Details Name: Drew [name] MRN: [id number] DOB: Nov 27, 1956 Today's Date: 03/19/2017   History of Present Illness  Pt is a 61 yo male admitted through ED 03/18/17 following an acute memory loss lasting about 2 hrs. Pt is undergoing a stroke work-up as a small stroke was noted on MRI. PMH significant for HTN and dyslipids.    Clinical Impression  Pt presents with the above diagnosis for therapy evaluation. 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 Pt is able to perform mobility with complete independence this session including scoring perfect scores on BERG and AM-6 clicks. Pt does not require any PT follow-up and will not be followed acutely. Please re-order PT services if any changes occur.    Follow Up Recommendations No PT follow up    Equipment Recommendations  None recommended by PT    Recommendations for Other Services       Precautions / Restrictions Precautions Precautions: None Restrictions Weight Bearing Restrictions: No      Mobility  Bed Mobility Overal bed mobility: Independent                Transfers Overall transfer level: Independent                  Ambulation/Gait Ambulation/Gait assistance: Independent Ambulation Distance (Feet): 250 Feet Assistive device: None Gait Pattern/deviations: WFL(Within Functional Limits) Gait velocity: good speed  Gait velocity interpretation: at or above normal speed for age/gender General Gait Details: No lob, good sequencing and cadence.   Stairs            Wheelchair Mobility    Modified Rankin (Stroke Patients Only) Modified Rankin (Stroke Patients Only) Pre-Morbid Rankin Score: No symptoms Modified Rankin: No symptoms     Balance Overall balance assessment: Independent                               Standardized Balance Assessment Standardized Balance Assessment :  Berg Balance Test Berg Balance Test Sit to Stand: Able to stand without using hands and stabilize independently Standing Unsupported: Able to stand safely 2 minutes Sitting with Back Unsupported but Feet Supported on Floor or Stool: Able to sit safely and securely 2 minutes Stand to Sit: Sits safely with minimal use of hands Transfers: Able to transfer safely, minor use of hands Standing Unsupported with Eyes Closed: Able to stand 10 seconds safely Standing Ubsupported with Feet Together: Able to place feet together independently and stand 1 minute safely From Standing, Reach Forward with Outstretched Arm: Can reach confidently >25 cm (10") From Standing Position, Pick up Object from Floor: Able to pick up shoe safely and easily From Standing Position, Turn to Look Behind Over each Shoulder: Looks behind from both sides and weight shifts well Turn 360 Degrees: Able to turn 360 degrees safely in 4 seconds or less Standing Unsupported, Alternately Place Feet on Step/Stool: Able to stand independently and safely and complete 8 steps in 20 seconds Standing Unsupported, One Foot in Front: Able to place foot tandem independently and hold 30 seconds Standing on One Leg: Able to lift leg independently and hold > 10 seconds Total Score: 56         Pertinent Vitals/Pain Pain Assessment: No/denies pain    Home Living Family/patient expects to be discharged to:: Private residence Living Arrangements: Spouse/significant other Available Help at Discharge: Family Type of Home:  House Home Access: Stairs to enter Entrance Stairs-Rails: None Technical brewer of Steps: 2 Home Layout: One level        Prior Function Level of Independence: Independent         Comments: completely indendent and working prior to [place] Hand: Right    Extremity/Trunk Assessment   Upper Extremity Assessment Upper Extremity Assessment: Overall WFL for tasks assessed     Lower Extremity Assessment Lower Extremity Assessment: Overall WFL for tasks assessed    Cervical / Trunk Assessment Cervical / Trunk Assessment: Normal  Communication   Communication: No difficulties  Cognition Arousal/Alertness: Awake/alert Behavior During Therapy: WFL for tasks assessed/performed Overall Cognitive Status: Within Functional Limits for tasks assessed                                        General Comments      Exercises     Assessment/Plan    PT Assessment Patent does not need any further PT services  PT Problem List         PT Treatment Interventions      PT Goals (Current goals can be found in the Care Plan section)       Frequency     Barriers to discharge        Co-evaluation               End of Session Equipment Utilized During Treatment: Gait belt Activity Tolerance: Patient tolerated treatment well Patient left: in bed;with call bell/phone within reach Nurse Communication: Mobility status PT Visit Diagnosis: Other symptoms and signs involving the nervous system (R29.898)    Time: 1157-2620 PT Time Calculation (min) (ACUTE ONLY): 14 min   Charges:   PT Evaluation $PT Eval Low Complexity: 1 Procedure     PT G Codes:   PT G-Codes **NOT FOR INPATIENT CLASS** Functional Assessment Tool Used: AM-PAC 6 Clicks Basic Mobility;Clinical judgement;Berg Functional Limitation: Mobility: Walking and moving around Mobility: Walking and Moving Around Current Status (B5597): 0 percent impaired, limited or restricted Mobility: Walking and Moving Around Goal Status (C1638): 0 percent impaired, limited or restricted Mobility: Walking and Moving Around Discharge Status [phone]): 0 percent impaired, limited or restricted    Scheryl Marten PT, DPT  [phone]   Drew [name] 03/19/2017, 9:44 AM

## 2017-03-19 NOTE — Procedures (Signed)
ELECTROENCEPHALOGRAM REPORT  Date of Study: 03/19/2017  Patient's Name: Drew [name] MRN: [id number] Date of Birth: [birth year]  Referring Provider: Cordelia Poche, MD  Clinical History: 61 year old man with history of transient memory loss with ischemic stroke.  Medications: acetaminophen (TYLENOL)  aspirin  enoxaparin (LOVENOX) injection 40 mg  pravastatin (PRAVACHOL) tablet 20 mg  senna-docusate (Senokot-S) tablet 1 tablet   Technical Summary: A multichannel digital EEG recording measured by the international 10-20 system with electrodes applied with paste and impedances below 5000 ohms performed in our laboratory with EKG monitoring in an awake and asleep patient.  Hyperventilation and photic stimulation were not performed.  The digital EEG was referentially recorded, reformatted, and digitally filtered in a variety of bipolar and referential montages for optimal display.    Description: The patient is awake and asleep during the recording.  During maximal wakefulness, there is a symmetric, medium voltage 9 Hz posterior dominant rhythm that attenuates with eye opening.  The record is symmetric.  During drowsiness and sleep, there is an increase in theta slowing of the background.  Vertex waves and symmetric sleep spindles were seen.  There were no epileptiform discharges or electrographic seizures seen.    EKG lead was unremarkable.  Impression: This awake and asleep EEG is normal.    Clinical Correlation: A normal EEG does not exclude a clinical diagnosis of epilepsy.  If further clinical questions remain, prolonged EEG may be helpful.  Clinical correlation is advised.   Metta Clines, DO

## 2017-03-19 NOTE — Evaluation (Signed)
Occupational Therapy Evaluation Patient Details Name: Drew [name] MRN: [id number] DOB: [birth year] Today's Date: 03/19/2017    History of Present Illness Pt is a 61 yo male admitted through ED 03/18/17 following an acute memory loss lasting about 2 hrs. Pt is undergoing a stroke work-up as a small stroke was noted on MRI. PMH significant for HTN and dyslipids.     Clinical Impression   Pt admitted with above.  He was administered the [facility] and scored 24/30 with norm being 26/30 or greater.  He demonstrated deficits with delayed recall and abstract thinking.  He was, however, able to perform path finding activity in hospital, and was able to follow and recall instruction and directions provided to him to find location, then find way back to room.  Discussed results of eval with pt and spouse instructing both of them to monitor him for deficits during ADLs tasks especially when task demands increase with distraction.   If they notice deficits that impact his performance, instructed them to discuss with MD at follow up as he may require referral to OPOT at that time.   He and wife were instructed in s/s of CVA.  At this time no further OT indicated, will sign off.     Follow Up Recommendations  No OT follow up    Equipment Recommendations  None recommended by OT    Recommendations for Other Services       Precautions / Restrictions Precautions Precautions: None Restrictions Weight Bearing Restrictions: No      Mobility Bed Mobility Overal bed mobility: Independent                Transfers Overall transfer level: Independent                    Balance Overall balance assessment: Independent                                         ADL either performed or assessed with clinical judgement   ADL Overall ADL's : Independent                                             Vision Baseline Vision/History: Wears glasses Wears Glasses: At  all times Patient Visual Report: No change from baseline Vision Assessment?: No apparent visual deficits     Perception Perception Perception Tested?: Yes   Praxis Praxis Praxis tested?: Within functional limits    Pertinent Vitals/Pain Pain Assessment: No/denies pain     Hand Dominance Right   Extremity/Trunk Assessment Upper Extremity Assessment Upper Extremity Assessment: Overall WFL for tasks assessed   Lower Extremity Assessment Lower Extremity Assessment: Overall WFL for tasks assessed   Cervical / Trunk Assessment Cervical / Trunk Assessment: Normal   Communication Communication Communication: No difficulties   Cognition Arousal/Alertness: Awake/alert Behavior During Therapy: WFL for tasks assessed/performed                                   General Comments: Pt scored 24/30 on MOCA with errors noted with delayted recall (only able to recall 1/5 items), and abstract info 1/2.  He was able to perform path finding in hospital, asking for directions and  able to recall those instructions to locate distant location and find way back using signage and recall of landmarks.     General Comments  Discussion with pt re: results of MOCA and potential effect on performance.  Instructed he and wife to monitor him for deficits during ADLs and at work.  Instructed them to discuss with MD on follow up, if he is having difficulty at that time as he may require referral to [facility].  Also instructed pt and wife on signs and symptoms of stroke.  They verbalized understanding     Exercises     Shoulder Instructions      Home Living Family/patient expects to be discharged to:: Private residence Living Arrangements: Spouse/significant other Available Help at Discharge: Family Type of Home: House Home Access: Stairs to enter Technical brewer of Steps: 2 Entrance Stairs-Rails: None Home Layout: One level                          Prior  Functioning/Environment Level of Independence: Independent        Comments: Pt works as Therapist, nutritional at an [employer] Problem List: Decreased cognition      OT Treatment/Interventions:      OT Goals(Current goals can be found in the care plan section) Acute Rehab OT Goals Patient Stated Goal: to go home  OT Goal Formulation: All assessment and education complete, DC therapy  OT Frequency:     Barriers to D/C:            Co-evaluation              End of Session Nurse Communication: Mobility status  Activity Tolerance: Patient tolerated treatment well Patient left: in bed;with call bell/phone within reach;with family/visitor present  OT Visit Diagnosis: Cognitive communication deficit (R41.841)                Time: 1212-1250 OT Time Calculation (min): 38 min Charges:  OT General Charges $OT Visit: 1 Procedure OT Evaluation $OT Eval Low Complexity: 1 Procedure OT Treatments $Therapeutic Activity: 23-37 mins G-Codes: OT G-codes **NOT FOR INPATIENT CLASS** Functional Assessment Tool Used: AM-PAC 6 Clicks Daily Activity Functional Limitation: Self care Self Care Current Status (B5208): At least 1 percent but less than 20 percent impaired, limited or restricted Self Care Goal Status (Y2233): At least 1 percent but less than 20 percent impaired, limited or restricted Self Care Discharge Status [phone]): At least 1 percent but less than 20 percent impaired, limited or restricted   [facility], OTR/L [phone]   Lucille Passy M 03/19/2017, 1:44 PM

## 2017-03-19 NOTE — Care Management Note (Signed)
Case Management Note  Patient Details  Name: Drew [name] MRN: [id number] Date of Birth: [birth year]  Subjective/Objective:                  Presents with ischemic stroke, hx of  HTN and dyslipidemia. From home with wife. Independent with ADL's PTA, no DME usage  RONAK DUQUETTE (Spouse)     [phone]      PCP: Garnet Koyanagi   Action/Plan: CVA workup .... CM to f/u with disposition needs.  Expected Discharge Date:                  Expected Discharge Plan:  Home/Self Care  In-House Referral:     Discharge planning Services  CM Consult  Post Acute Care Choice:    Choice offered to:     DME Arranged:    DME Agency:     HH Arranged:    HH Agency:     Status of Service:  In process, will continue to follow  If discussed at Long Length of Stay Meetings, dates discussed:    Additional Comments:  Sharin Mons, RN 03/19/2017, 4:15 PM

## 2017-03-19 NOTE — Progress Notes (Signed)
EEG completed, results pending. 

## 2017-03-19 NOTE — Consult Note (Signed)
Requesting Physician: Dr. Teryl Lucy    Chief Complaint: possible TGA with Punctate infarct.  History obtained from:  Patient  --as majority of the information was obtained from the chart due to the patient not recalling much of the events that occurred that brought him to [facility]. Patient is back to baseline prior time  HPI:                                                                                                                                         Drew [name] is an 61 y.o. male "with medical history significant of HTN, Dyslipidemia presents to the ER with confusion and memory loss that lasted 2 hours. Pt recalls having a normal morning including waking up, taking his BP/cholesterol meds, going for a walk then ate breakfast and went to work, then cannot recall anything after that for about 2 hours, his boss called his wife saying that he wasn't acting right, was repeating things and seemed confused, he then drove him home at this time patient felt back to normal but couldn't recall anything that happened in the 2 hours he was at work."  Currently patient is at [employer]. Patient still does not recall the 2 hours that occurred. He feels that he is back to his baseline. Patient did obtain an MRI of his brain that did show a punctate, 3 mm focus of acute ischemia within the right temporal lobe and hippocampus/hippocampal gyrus. Ischemia in this area can be seen in the setting of acute memory loss.  Date last known well: Date: 03/17/2017 Time last known well: Time: 21:30 tPA Given: No: Resolution of symptoms   Past Medical History:  Diagnosis Date  . High cholesterol   . Hypertension     Past Surgical History:  Procedure Laterality Date  . HERNIA REPAIR Left 2004    Family History  Problem Relation Age of Onset  . Heart disease Mother     Duanne Limerick  . Berenice Primas' disease Mother   . Heart disease Father   . Hyperlipidemia Brother   . Hypertension Brother   . Heart  disease Paternal Uncle   . Heart disease Paternal Uncle   . Heart disease Paternal Uncle   . Heart disease Paternal Uncle   . Heart disease Paternal Uncle   . Heart disease Paternal Uncle   . Hypertension Brother   . Colon cancer Neg Hx    Social History:  reports that he quit smoking about 29 years ago. His smoking use included Cigarettes. He has a 15.00 pack-year smoking history. His smokeless tobacco use includes Chew. He reports that he does not drink alcohol or use drugs.  Allergies: No Known Allergies  Medications:  Current Facility-Administered Medications  Medication Dose Route Frequency Provider Last Rate Last Dose  . 0.9 %  sodium chloride infusion   Intravenous Continuous Domenic Polite, MD 75 mL/hr at 03/18/17 2694    . acetaminophen (TYLENOL) tablet 650 mg  650 mg Oral Q4H PRN Domenic Polite, MD       Or  . acetaminophen (TYLENOL) solution 650 mg  650 mg Per Tube Q4H PRN Domenic Polite, MD       Or  . acetaminophen (TYLENOL) suppository 650 mg  650 mg Rectal Q4H PRN Domenic Polite, MD      . aspirin suppository 300 mg  300 mg Rectal Daily Domenic Polite, MD       Or  . aspirin tablet 325 mg  325 mg Oral Daily Domenic Polite, MD   325 mg at 03/19/17 0053  . enoxaparin (LOVENOX) injection 40 mg  40 mg Subcutaneous Q24H Domenic Polite, MD   40 mg at 03/18/17 2104  . pravastatin (PRAVACHOL) tablet 20 mg  20 mg Oral Daily Domenic Polite, MD      . senna-docusate (Senokot-S) tablet 1 tablet  1 tablet Oral QHS PRN Domenic Polite, MD         ROS:                                                                                                                                       History obtained from the patient  General ROS: negative for - chills, fatigue, fever, night sweats, weight gain or weight loss Psychological ROS: negative for - behavioral disorder,  hallucinations, memory difficulties, mood swings or suicidal ideation Ophthalmic ROS: negative for - blurry vision, double vision, eye pain or loss of vision ENT ROS: negative for - epistaxis, nasal discharge, oral lesions, sore throat, tinnitus or vertigo Allergy and Immunology ROS: negative for - hives or itchy/watery eyes Hematological and Lymphatic ROS: negative for - bleeding problems, bruising or swollen lymph nodes Endocrine ROS: negative for - galactorrhea, hair pattern changes, polydipsia/polyuria or temperature intolerance Respiratory ROS: negative for - cough, hemoptysis, shortness of breath or wheezing Cardiovascular ROS: negative for - chest pain, dyspnea on exertion, edema or irregular heartbeat Gastrointestinal ROS: negative for - abdominal pain, diarrhea, hematemesis, nausea/vomiting or stool incontinence Genito-Urinary ROS: negative for - dysuria, hematuria, incontinence or urinary frequency/urgency Musculoskeletal ROS: negative for - joint swelling or muscular weakness Neurological ROS: as noted in HPI Dermatological ROS: negative for rash and skin lesion changes  Neurologic Examination:  Blood pressure 114/77, pulse (!) 54, temperature 98.7 F (37.1 C), temperature source Oral, resp. rate 18, height 5\' 7"  (1.702 m), weight 65.8 kg (145 lb), SpO2 98 %.  HEENT-  Normocephalic, no lesions, without obvious abnormality.  Normal external eye and conjunctiva.  Normal TM's bilaterally.  Normal auditory canals and external ears. Normal external nose, mucus membranes and septum.  Normal pharynx. Cardiovascular- S1, S2 normal, pulses palpable throughout   Lungs- chest clear, no wheezing, rales, normal symmetric air entry Abdomen- normal findings: bowel sounds normal Extremities- no edema Lymph-no adenopathy palpable Musculoskeletal-no joint tenderness, deformity or swelling Skin-warm  and dry, no hyperpigmentation, vitiligo, or suspicious lesions  Neurological Examination Mental Status: Alert, oriented, thought content appropriate.  Speech fluent without evidence of aphasia.  Able to follow 3 step commands without difficulty. Cranial Nerves: II: Discs flat bilaterally; Visual fields grossly normal,  III,IV, VI: ptosis not present, extra-ocular motions intact bilaterally, pupils equal, round, reactive to light and accommodation V,VII: smile symmetric, facial light touch sensation normal bilaterally VIII: hearing normal bilaterally IX,X: uvula rises symmetrically XI: bilateral shoulder shrug XII: midline tongue extension Motor: Right : Upper extremity   5/5    Left:     Upper extremity   5/5  Lower extremity   5/5     Lower extremity   5/5 Tone and bulk:normal tone throughout; no atrophy noted Sensory: Pinprick and light touch intact throughout, bilaterally Deep Tendon Reflexes: 2+ and symmetric throughout Plantars: Right: downgoing   Left: downgoing Cerebellar: normal finger-to-nose,  and normal heel-to-shin test Gait: normal gait and station       Lab Results: Basic Metabolic Panel:  Recent Labs Lab 03/18/17 0923 03/18/17 1855  NA 140  --   K 3.6  --   CL 103  --   CO2 27  --   GLUCOSE 103*  --   BUN 9  --   CREATININE 0.77 0.88  CALCIUM 9.6  --     Liver Function Tests:  Recent Labs Lab 03/18/17 0923  AST 21  ALT 17  ALKPHOS 65  BILITOT 0.7  PROT 7.1  ALBUMIN 4.0   No results for input(s): LIPASE, AMYLASE in the last 168 hours. No results for input(s): AMMONIA in the last 168 hours.  CBC:  Recent Labs Lab 03/18/17 0923 03/18/17 1855 03/19/17 0723  WBC 10.1 9.0 6.4  NEUTROABS 8.2*  --   --   HGB 17.3* 15.9 16.2  HCT 47.9 46.1 47.4  MCV 91.1 92.6 93.3  PLT 164 188 178    Cardiac Enzymes:  Recent Labs Lab 03/18/17 0923  TROPONINI <0.03    Lipid Panel: No results for input(s): CHOL, TRIG, HDL, CHOLHDL, VLDL, LDLCALC  in the last 168 hours.  CBG:  Recent Labs Lab 03/18/17 0915  GLUCAP 95    Microbiology: No results found for this or any previous visit.  Coagulation Studies: No results for input(s): LABPROT, INR in the last 72 hours.  Imaging: Ct Head Wo Contrast  Result Date: 03/18/2017 CLINICAL DATA:  Confusion, memory issues starting this morning EXAM: CT HEAD WITHOUT CONTRAST TECHNIQUE: Contiguous axial images were obtained from the base of the skull through the vertex without intravenous contrast. COMPARISON:  12/04/2016 FINDINGS: Brain: No intracranial hemorrhage, mass effect or midline shift. No definite acute cortical infarction. Ventricular size is stable from prior exam. No mass lesion is noted on this unenhanced scan. Vascular: Mild atherosclerotic calcifications of carotid siphon Skull: No skull fracture is noted. Sinuses/Orbits: Mucosal thickening  with partial opacification bilateral ethmoid air cells. Mucosal thickening bilateral sphenoid sinuses. Mild mucosal thickening maxillary sinuses. Other: None IMPRESSION: No acute intracranial abnormality. No definite acute cortical infarction. Paranasal sinuses disease as described above. Electronically Signed   By: Lahoma Crocker M.D.   On: 03/18/2017 09:47   Mr Brain Wo Contrast  Result Date: 03/19/2017 CLINICAL DATA:  TIA.  Confusion and memory loss. EXAM: MRI HEAD WITHOUT CONTRAST TECHNIQUE: Multiplanar, multiecho pulse sequences of the brain and surrounding structures were obtained without intravenous contrast. COMPARISON:  Head CT 03/18/2017 FINDINGS: Brain: There is a 3 mm focus of diffusion restriction within the medial right temporal lobe at the hippocampus/para hippocampal gyrus. There is mild periventricular white matter hyperintensity. No other focal parenchymal signal abnormality. No mass lesion or midline shift. No hydrocephalus or extra-axial fluid collection. The midline structures are normal. No age advanced or lobar predominant atrophy.  Vascular: Major intracranial arterial and venous sinus flow voids are preserved. No evidence of chronic microhemorrhage or amyloid angiopathy. Skull and upper cervical spine: The visualized skull base, calvarium, upper cervical spine and extracranial soft tissues are normal. Sinuses/Orbits: Diffuse mild mucosal thickening of the paranasal sinuses. Small amount of right mastoid fluid. Normal orbits. IMPRESSION: 1. Punctate, 3 mm focus of acute ischemia within the medial right temporal lobe, near the hippocampus/para hippocampal gyrus. Ischemia in this area can be seen in the setting of acute memory loss. 2. No hemorrhage or mass effect. 3. Minimal findings of chronic microvascular disease for age. Electronically Signed   By: Ulyses Jarred M.D.   On: 03/19/2017 00:07       Assessment and plan discussed with with attending physician and they are in agreement.    Etta Quill PA-C [facility] [phone]  03/19/2017, 9:55 AM   Assessment: 61 y.o. male presenting to [facility] emergency department after a two-hour period of acute memory loss. Initially this was thought to be transient global amnesia. Patient was transferred to [facility] where MRI was found to show a punctate infarct in the right temporal lobe. Strokes in these areas can cause acute memory loss. Initially TGA was thought to be the diagnosis however given the MRI findings this may be in the differential however this also could be the stroke that caused his transient memory loss. At this time patient would benefit from both stroke workup and EEG.  Stroke Risk Factors - hyperlipidemia and hypertension  1. HgbA1c, fasting lipid panel 2. MRA  of the brain without contrast 3. PT consult, OT consult, Speech consult 4. Echocardiogram 5. Carotid dopplers 6. Prophylactic therapy-Antiplatelet med: Aspirin - dose 325 mg 7. Risk factor modification 8. Telemetry monitoring 9. Frequent neuro checks 10 NPO until passes stroke swallow  screen 11 EEG 12 please page stroke NP  Or  PA  Or MD from 8am -4 pm  as this patient from this time will be  followed by the stroke.   You can look them up on [email or web address]  Password [email or web address]

## 2017-03-19 NOTE — Progress Notes (Signed)
PROGRESS NOTE    Drew [name]  BLT:[id number] DOB: 09-16-1956 DOA: 03/18/2017 PCP: [name], DO   Brief Narrative: Drew [name] is a 61 y.o. male with medical history significant of HTN and dyslipidemia. Patient with a history of transient memory loss and found to have an ischemic stroke.   Assessment & Plan:   Principal Problem:   Ischemic stroke (Green) Active Problems:   HTN (hypertension)   Hyperlipidemia   Ischemic stroke MRI significant for right temporal lobe stroke. On pravastatin -Neurology recommendations: MRA, carotid dopplers -echocardiogram pending  Essential hypertension Holding lisinopril-hydrochlorothiazide to allow for permissive hypertension  Hyperlipidemia -Continue pravastatin  DVT prophylaxis: Lovenox Code Status: Full code Family Communication: None at bedside Disposition Plan: Discharge tomorrow after stroke workup   Consultants:   Neurology  Procedures:   None  Antimicrobials:  None    Subjective: Patient reports no issues overnight. No chest pain or dyspnea.  Objective: Vitals:   03/19/17 0050 03/19/17 0322 03/19/17 0401 03/19/17 0601  BP: 120/65 114/70 117/69 114/77  Pulse: (!) 58 (!) 51 (!) 55 (!) 54  Resp: 18     Temp:      TempSrc:      SpO2: 97% 98% 97% 98%  Weight:      Height:        Intake/Output Summary (Last 24 hours) at 03/19/17 1325 Last data filed at 03/19/17 1200  Gross per 24 hour  Intake             1285 ml  Output                0 ml  Net             1285 ml   Filed Weights   03/18/17 0912 03/18/17 1743  Weight: 68 kg (150 lb) 65.8 kg (145 lb)    Examination:  General exam: Appears calm and comfortable Respiratory system: Clear to auscultation. Respiratory effort normal. Cardiovascular system: S1 & S2 heard, RRR. No murmurs Gastrointestinal system: Abdomen is nondistended, soft and nontender. Normal bowel sounds heard. Central nervous system: Alert and oriented. No focal  neurological deficits. 5/5 strength in all extremities. 1+ equal reflexes bilaterally Extremities: No edema. No calf tenderness Skin: No cyanosis. No rashes Psychiatry: Judgement and insight appear normal. Mood & affect appropriate.     Data Reviewed: I have personally reviewed following labs and imaging studies  CBC:  Recent Labs Lab 03/18/17 0923 03/18/17 1855 03/19/17 0723  WBC 10.1 9.0 6.4  NEUTROABS 8.2*  --   --   HGB 17.3* 15.9 16.2  HCT 47.9 46.1 47.4  MCV 91.1 92.6 93.3  PLT 164 188 007   Basic Metabolic Panel:  Recent Labs Lab 03/18/17 0923 03/18/17 1855 03/19/17 0723  NA 140  --  141  K 3.6  --  4.2  CL 103  --  104  CO2 27  --  29  GLUCOSE 103*  --  75  BUN 9  --  8  CREATININE 0.77 0.88 0.97  CALCIUM 9.6  --  9.3   GFR: Estimated Creatinine Clearance: 74.4 mL/min (by C-G formula based on SCr of 0.97 mg/dL). Liver Function Tests:  Recent Labs Lab 03/18/17 0923  AST 21  ALT 17  ALKPHOS 65  BILITOT 0.7  PROT 7.1  ALBUMIN 4.0   No results for input(s): LIPASE, AMYLASE in the last 168 hours. No results for input(s): AMMONIA in the last 168 hours. Coagulation Profile: No  results for input(s): INR, PROTIME in the last 168 hours. Cardiac Enzymes:  Recent Labs Lab 03/18/17 0923  TROPONINI <0.03   BNP (last 3 results) No results for input(s): PROBNP in the last 8760 hours. HbA1C: No results for input(s): HGBA1C in the last 72 hours. CBG:  Recent Labs Lab 03/18/17 0915  GLUCAP 95   Lipid Profile:  Recent Labs  03/19/17 0723  CHOL 162  HDL 43  LDLCALC 106*  TRIG 64  CHOLHDL 3.8   Thyroid Function Tests: No results for input(s): TSH, T4TOTAL, FREET4, T3FREE, THYROIDAB in the last 72 hours. Anemia Panel: No results for input(s): VITAMINB12, FOLATE, FERRITIN, TIBC, IRON, RETICCTPCT in the last 72 hours. Sepsis Labs: No results for input(s): PROCALCITON, LATICACIDVEN in the last 168 hours.  No results found for this or any  previous visit (from the past 240 hour(s)).       Radiology Studies: Ct Head Wo Contrast  Result Date: 03/18/2017 CLINICAL DATA:  Confusion, memory issues starting this morning EXAM: CT HEAD WITHOUT CONTRAST TECHNIQUE: Contiguous axial images were obtained from the base of the skull through the vertex without intravenous contrast. COMPARISON:  12/04/2016 FINDINGS: Brain: No intracranial hemorrhage, mass effect or midline shift. No definite acute cortical infarction. Ventricular size is stable from prior exam. No mass lesion is noted on this unenhanced scan. Vascular: Mild atherosclerotic calcifications of carotid siphon Skull: No skull fracture is noted. Sinuses/Orbits: Mucosal thickening with partial opacification bilateral ethmoid air cells. Mucosal thickening bilateral sphenoid sinuses. Mild mucosal thickening maxillary sinuses. Other: None IMPRESSION: No acute intracranial abnormality. No definite acute cortical infarction. Paranasal sinuses disease as described above. Electronically Signed   By: Lahoma Crocker M.D.   On: 03/18/2017 09:47   Mr Brain Wo Contrast  Result Date: 03/19/2017 CLINICAL DATA:  TIA.  Confusion and memory loss. EXAM: MRI HEAD WITHOUT CONTRAST TECHNIQUE: Multiplanar, multiecho pulse sequences of the brain and surrounding structures were obtained without intravenous contrast. COMPARISON:  Head CT 03/18/2017 FINDINGS: Brain: There is a 3 mm focus of diffusion restriction within the medial right temporal lobe at the hippocampus/para hippocampal gyrus. There is mild periventricular white matter hyperintensity. No other focal parenchymal signal abnormality. No mass lesion or midline shift. No hydrocephalus or extra-axial fluid collection. The midline structures are normal. No age advanced or lobar predominant atrophy. Vascular: Major intracranial arterial and venous sinus flow voids are preserved. No evidence of chronic microhemorrhage or amyloid angiopathy. Skull and upper cervical  spine: The visualized skull base, calvarium, upper cervical spine and extracranial soft tissues are normal. Sinuses/Orbits: Diffuse mild mucosal thickening of the paranasal sinuses. Small amount of right mastoid fluid. Normal orbits. IMPRESSION: 1. Punctate, 3 mm focus of acute ischemia within the medial right temporal lobe, near the hippocampus/para hippocampal gyrus. Ischemia in this area can be seen in the setting of acute memory loss. 2. No hemorrhage or mass effect. 3. Minimal findings of chronic microvascular disease for age. Electronically Signed   By: Ulyses Jarred M.D.   On: 03/19/2017 00:07        Scheduled Meds: . aspirin  300 mg Rectal Daily   Or  . aspirin  325 mg Oral Daily  . enoxaparin (LOVENOX) injection  40 mg Subcutaneous Q24H  . pravastatin  20 mg Oral Daily   Continuous Infusions: . sodium chloride 75 mL/hr (03/19/17 0959)     LOS: 0 days     Cordelia Poche, MD [facility] 03/19/2017, 1:25 PM Pager: [phone]  If 7PM-7AM, please  contact night-coverage [email or web address] Password [email or web address] 03/19/2017, 1:25 PM

## 2017-03-20 ENCOUNTER — Inpatient Hospital Stay (HOSPITAL_COMMUNITY): Payer: 59

## 2017-03-20 DIAGNOSIS — I6789 Other cerebrovascular disease: Secondary | ICD-10-CM

## 2017-03-20 DIAGNOSIS — G459 Transient cerebral ischemic attack, unspecified: Secondary | ICD-10-CM

## 2017-03-20 DIAGNOSIS — E785 Hyperlipidemia, unspecified: Secondary | ICD-10-CM

## 2017-03-20 DIAGNOSIS — I10 Essential (primary) hypertension: Secondary | ICD-10-CM

## 2017-03-20 LAB — VAS US CAROTID
LCCAPDIAS: 22 cm/s
LEFT ECA DIAS: -14 cm/s
LEFT VERTEBRAL DIAS: -17 cm/s
LICAPDIAS: -24 cm/s
Left CCA dist dias: 20 cm/s
Left CCA dist sys: 84 cm/s
Left CCA prox sys: 103 cm/s
Left ICA dist dias: -34 cm/s
Left ICA dist sys: -92 cm/s
Left ICA prox sys: -75 cm/s
RCCAPDIAS: 17 cm/s
RIGHT ECA DIAS: -15 cm/s
RIGHT VERTEBRAL DIAS: -12 cm/s
Right CCA prox sys: 82 cm/s
Right cca dist sys: -75 cm/s

## 2017-03-20 LAB — HEMOGLOBIN A1C
HEMOGLOBIN A1C: 5.4 % (ref 4.8–5.6)
Hgb A1c MFr Bld: 5.4 % (ref 4.8–5.6)
MEAN PLASMA GLUCOSE: 108 mg/dL
Mean Plasma Glucose: 108 mg/dL

## 2017-03-20 LAB — ECHOCARDIOGRAM COMPLETE
Height: 67 in
WEIGHTICAEL: 2320 [oz_av]

## 2017-03-20 MED ORDER — PRAVASTATIN SODIUM 40 MG PO TABS: 40.0000 mg | ORAL_TABLET | Freq: Every day | ORAL | Status: DC

## 2017-03-20 MED ORDER — ASPIRIN EC 325 MG PO TBEC: 325.0000 mg | DELAYED_RELEASE_TABLET | Freq: Every day | ORAL | 0 refills | Status: AC

## 2017-03-20 MED ORDER — PRAVASTATIN SODIUM 20 MG PO TABS: 40.0000 mg | ORAL_TABLET | Freq: Every day | ORAL | 0 refills | Status: DC

## 2017-03-20 NOTE — Progress Notes (Signed)
  Echocardiogram 2D Echocardiogram has been performed.  Bobbye Charleston 03/20/2017, 11:01 AM

## 2017-03-20 NOTE — Progress Notes (Addendum)
STROKE TEAM PROGRESS NOTE   HISTORY OF PRESENT ILLNESS (per record) Drew [name] is an 61 y.o. male with history of HTN, HLD presents to the ER with confusion and memory loss that lasted 2 hours. Pt recalls having a normal morning including waking up, taking his BP/cholesterol meds, going for a walk, ate breakfast and went to work. Following that, he cannot recall anything for about 2 hours. His boss called his wife saying that he wasn't acting right, was repeating things and seemed confused. He then drove him home. At this time patient felt back to normal but could not recall anything that happened in the 2 hours he was "at work." In the ED patient still does not recall the 2 hours that occurred. He feels that he is back to his baseline. Patient did obtain an MRI of his brain that showed a punctate, 3 mm focus of acute ischemia within the right temporal lobe and hippocampus/hippocampal gyrus. Ischemia in this area can be seen in the setting of acute memory loss. Patient was last known well 03/17/2017 at 21:30. Patient was not administered IV t-PA secondary to resolution of symptoms. He was admitted for further evaluation and treatment.   SUBJECTIVE (INTERVAL HISTORY) Wife at bedside. Pt had antegrade amnesia for 2 hours yesterday. Now back to baseline. MRI showed right mesial temporal lobe infarct. EEG normal.    OBJECTIVE Temp:  [98 F (36.7 C)-98.5 F (36.9 C)] 98 F (36.7 C) (04/11 0513) Pulse Rate:  [54-60] 56 (04/11 0513) Cardiac Rhythm: Sinus bradycardia (04/11 0700) Resp:  [16-18] 18 (04/11 0513) BP: (119-138)/(68-79) 138/79 (04/11 0513) SpO2:  [97 %-99 %] 99 % (04/11 0513)  CBC:  Recent Labs Lab 03/18/17 0923 03/18/17 1855 03/19/17 0723  WBC 10.1 9.0 6.4  NEUTROABS 8.2*  --   --   HGB 17.3* 15.9 16.2  HCT 47.9 46.1 47.4  MCV 91.1 92.6 93.3  PLT 164 188 403    Basic Metabolic Panel:  Recent Labs Lab 03/18/17 0923 03/18/17 1855 03/19/17 0723  NA 140  --  141  K 3.6   --  4.2  CL 103  --  104  CO2 27  --  29  GLUCOSE 103*  --  75  BUN 9  --  8  CREATININE 0.77 0.88 0.97  CALCIUM 9.6  --  9.3    Lipid Panel:    Component Value Date/Time   CHOL 162 03/19/2017 0723   TRIG 64 03/19/2017 0723   HDL 43 03/19/2017 0723   CHOLHDL 3.8 03/19/2017 0723   VLDL 13 03/19/2017 0723   LDLCALC 106 (H) 03/19/2017 0723   HgbA1c:  Lab Results  Component Value Date   HGBA1C 5.4 03/19/2017   Urine Drug Screen: No results found for: LABOPIA, COCAINSCRNUR, LABBENZ, AMPHETMU, THCU, LABBARB  Alcohol Level No results found for: ETH  IMAGING  Ct Head Wo Contrast 03/18/2017 No acute intracranial abnormality. No definite acute cortical infarction. Paranasal sinuses disease   Mr Brain Wo Contrast 03/19/2017 1. Punctate, 3 mm focus of acute ischemia within the medial right temporal lobe, near the hippocampus/para hippocampal gyrus. Ischemia in this area can be seen in the setting of acute memory loss. 2. No hemorrhage or mass effect. 3. Minimal findings of chronic microvascular disease for age.    Mr Jodene Nam Head Wo Contrast 03/19/2017 1. Negative intracranial MRA for large vessel occlusion. No high-grade or correctable stenosis. 2. Atheromatous irregularity within the cavernous right ICA with associated mild to moderate multifocal  narrowing. 3. Hypoplastic/absent right A1 segment, with overall slightly diminutive right internal carotid artery system. Anterior cerebral artery supplied via the left carotid artery system. 4. Predominant fetal type PCAs, with overall diminutive vertebrobasilar system. Right vertebral artery largely terminates in PICA.   2D Echocardiogram  - Left ventricle: The cavity size was normal. Systolic function was normal. The estimated ejection fraction was in the range of 55% to 60%. Wall motion was normal; there were no regional wall motion abnormalities. Left ventricular diastolic function parameters were normal. - Aortic valve: Transvalvular velocity  was within the normal range.  There was no stenosis. There was mild regurgitation. - Mitral valve: Transvalvular velocity was within the normal range. There was no evidence for stenosis. There was mild regurgitation. - Right ventricle: The cavity size was normal. Wall thickness was normal. Systolic function was normal. - Atrial septum: No defect or patent foramen ovale was identified. - Tricuspid valve: There was mild regurgitation. - Pulmonary arteries: Systolic pressure was within the normal range. PA peak pressure: 30 mm Hg (S).  Carotid Doppler   Mild plaque in bilateral carotid arteries without significant stenosis. Vertebral arteries demonstrated antegrade flow.   EEG  This awake and asleep EEG is normal.   A normal EEG does not exclude a clinical diagnosis of epilepsy.  If further clinical questions remain, prolonged EEG may be helpful.  Clinical correlation is advised.  TTE - Left ventricle: The cavity size was normal. Systolic function was   normal. The estimated ejection fraction was in the range of 55%   to 60%. Wall motion was normal; there were no regional wall   motion abnormalities. Left ventricular diastolic function   parameters were normal. - Aortic valve: Transvalvular velocity was within the normal range.   There was no stenosis. There was mild regurgitation. - Mitral valve: Transvalvular velocity was within the normal range.   There was no evidence for stenosis. There was mild regurgitation. - Right ventricle: The cavity size was normal. Wall thickness was   normal. Systolic function was normal. - Atrial septum: No defect or patent foramen ovale was identified. - Tricuspid valve: There was mild regurgitation. - Pulmonary arteries: Systolic pressure was within the normal   range. PA peak pressure: 30 mm Hg (S).   PHYSICAL EXAM Temp:  [97.8 F (36.6 C)-98.5 F (36.9 C)] 97.8 F (36.6 C) (04/11 1320) Pulse Rate:  [54-76] 76 (04/11 1401) Resp:  [14-18] 14 (04/11  1320) BP: (113-138)/(61-83) 134/83 (04/11 1402) SpO2:  [98 %-99 %] 98 % (04/11 1320)  General - Well nourished, well developed, in no apparent distress.  Ophthalmologic - Sharp disc margins OU.   Cardiovascular - Regular rate and rhythm.  Mental Status -  Level of arousal and orientation to time, place, and person were intact. Language including expression, naming, repetition, comprehension was assessed and found intact. Attention span and concentration were normal. Recent and remote memory were intact. Fund of Knowledge was assessed and was intact.  Cranial Nerves II - XII - II - Visual field intact OU. III, IV, VI - Extraocular movements intact. V - Facial sensation intact bilaterally. VII - Facial movement intact bilaterally. VIII - Hearing & vestibular intact bilaterally. X - Palate elevates symmetrically. XI - Chin turning & shoulder shrug intact bilaterally. XII - Tongue protrusion intact.  Motor Strength - The patient's strength was normal in all extremities and pronator drift was absent.  Bulk was normal and fasciculations were absent.   Motor Tone - Muscle tone was  assessed at the neck and appendages and was normal.  Reflexes - The patient's reflexes were 1+ in all extremities and he had no pathological reflexes.  Sensory - Light touch, temperature/pinprick, vibration and proprioception, and Romberg testing were assessed and were symmetrical.    Coordination - The patient had normal movements in the hands and feet with no ataxia or dysmetria.  Tremor was absent.  Gait and Station - The patient's transfers, posture, gait, station, and turns were observed as normal.   ASSESSMENT/PLAN [name] is a 61 y.o. male with history of HTN, HLD presenting with memory loss x 2 hours. He did not receive IV t-PA due to resolution of symptoms.   Stroke:  Punctate R mesial  temporal lobe infarct, secondary to small vessel disease source  Resultant antegrade amnesia 2  hours  CT head no acute infarct. Sinus disease  MRI  Punctate mesial R temporal lobe ischemia. small vessel disease.  MRA  No LVO. R ICA atheroma with mild narrowing.   Carotid Doppler  No significant B ICA stenosis, VAs antegrade   2D Echo  EF 55-60%. No source of embolus   EEG normal  LDL 106  HgbA1c 5.4  Lovenox 40 mg sq daily for VTE prophylaxis  Diet Heart Room service appropriate? Yes; Fluid consistency: Thin  No antithrombotic prior to admission, now on aspirin 325 mg daily. Continue ASA 325mg  on discharge.  Patient counseled to be compliant with his antithrombotic medications  Ongoing aggressive stroke risk factor management  Therapy recommendations:  No therapy needs  Disposition:  Return home  Hypertension  Stable  Permissive hypertension (OK if < 220/120) but gradually normalize in 5-7 days  Long-term BP goal normotensive  Hyperlipidemia  Home meds:  pravachol 20, resumed in hospital  LDL 106, goal < 70  Increased pravastatin to 40mg  daily  Continue statin at discharge  Other Stroke Risk Factors  Former Cigarette smoker, quit 29 years ago  Chewing tobacco use - advised to quit  Hospital day # 1  Neurology will sign off. Please call with questions. Pt will follow up with Cecille Rubin NP at [facility] in about 6 weeks. Thanks for the consult.  [name], MD PhD Stroke Neurology 03/20/2017 4:35 PM    To contact Stroke Continuity provider, please refer to [email or web address] After hours, contact General Neurology

## 2017-03-20 NOTE — Discharge Summary (Signed)
Physician Discharge Summary  Drew [name] UQJ:[id number] DOB: 1956/01/29  PCP: [name], DO  Admit date: 03/18/2017 Discharge date: 03/20/2017  Recommendations for Outpatient Follow-up:  1. Dr. Roma Schanz, PCP in 1 week. 2. Dennie Bible, NP/[facility] in 6 weeks: Office will arrange follow-up.  Home Health: None Equipment/Devices: None    Discharge Condition: Improved and stable  CODE STATUS: Full  Diet recommendation: Heart healthy diet.  Discharge Diagnoses:  Principal Problem:   Ischemic stroke ([place]) Active Problems:   HTN (hypertension)   Hyperlipidemia   Brief Summary: 61 year old male with PMH of HTN, HLD presented to ED with confusion and memory loss that lasted for a few hours on day of admission. When patient was leaving for work at approximately 6 AM, his daughter noticed something abnormal because patient kept repeating the same questions again. He went to work and then by 8:30 AM, his supervisor called patient's spouse that he was not behaving his usual and brought him home. Wife noted memory impairment and patient kept repeating the same questions again and again. She did not notice any dysarthria, facial asymmetry, asymmetric limb weakness or ataxia. By early afternoon, his symptoms gradually started subsiding. Patient has no recollection of events that happened at work. MRI brain confirmed acute stroke. Admitted for further evaluation and management.  Assessment and plan:  1. Acute stroke: Punctate right mesial temporal lobe infarct, secondary to small vessel disease source: Resultant antegrade amnesia for 2 hours while at work. CT head without acute infarct. MRI brain: Punctate mesial right temporal lobe ischemia. MRA: No large vessel occlusion. Right ICA atheroma with mild narrowing. Carotid Doppler: No significant bilateral ICA stenosis. Vertebral artery flow antegrade. 2-D echo: LVEF 55-60 percent. No source of embolus. EEG  normal. LDL 106. A1c 5.4. Not on antithrombotic prior to admission. Therapies recommend no follow-up. Neurology consultation and follow-up appreciated. Recommend continuing aspirin 325 MG daily for secondary stroke prophylaxis and have cleared him for discharge home and have arranged outpatient follow-up in 6 weeks. Mental status back to baseline. 2. Essential hypertension: Controlled. As discussed with Dr. Erlinda Hong, okay to resume antihypertensives at discharge. 3. Hyperlipidemia: LDL 106, goal <70. Increased pravastatin from 20-40 mg daily. 4. Tobacco abuse: Former smoker, quit 29 years ago. Chews tobacco: Counseled regarding quitting. Verbalized understanding.   Consultations:  Neurology  Procedures:  2-D echo 03/20/17: Study Conclusions  - Left ventricle: The cavity size was normal. Systolic function was   normal. The estimated ejection fraction was in the range of 55%   to 60%. Wall motion was normal; there were no regional wall   motion abnormalities. Left ventricular diastolic function   parameters were normal. - Aortic valve: Transvalvular velocity was within the normal range.   There was no stenosis. There was mild regurgitation. - Mitral valve: Transvalvular velocity was within the normal range.   There was no evidence for stenosis. There was mild regurgitation. - Right ventricle: The cavity size was normal. Wall thickness was   normal. Systolic function was normal. - Atrial septum: No defect or patent foramen ovale was identified. - Tricuspid valve: There was mild regurgitation. - Pulmonary arteries: Systolic pressure was within the normal   range. PA peak pressure: 30 mm Hg (S).   Carotid Dopplers 03/20/17: Summary: Mild plaque visualized in bilateral extracranial carotid arteries without significant stenosis demonstrated. Vertebral are patent with antegrade flow.   Discharge Instructions  Discharge Instructions    Call MD for:    Complete  by:  As directed     Strokelike symptoms.   Diet - low sodium heart healthy    Complete by:  As directed    Increase activity slowly    Complete by:  As directed        Medication List    TAKE these medications   aspirin EC 325 MG tablet Take 1 tablet (325 mg total) by mouth daily. Start taking on:  03/21/2017   Fish Oil 1200 MG Caps Take by mouth daily.   Flaxseed Oil 1200 MG Caps Take 2 capsules by mouth daily.   lisinopril-hydrochlorothiazide 20-12.5 MG tablet Commonly known as:  PRINZIDE,ZESTORETIC TAKE TWO TABLETS BY MOUTH ONCE DAILY   pravastatin 20 MG tablet Commonly known as:  PRAVACHOL Take 2 tablets (40 mg total) by mouth daily. What changed:  how much to take      Follow-up Information    Dennie Bible, NP. Schedule an appointment as soon as possible for a visit in 6 week(s).   Specialty:  Family Medicine Contact information: [address] [place] [address] [place], DO. Schedule an appointment as soon as possible for a visit in 1 week(s).   Specialty:  Family Medicine Contact information: [address] [place] [address] [phone]          No Known Allergies    Procedures/Studies: Ct Head Wo Contrast  Result Date: 03/18/2017 CLINICAL DATA:  Confusion, memory issues starting this morning EXAM: CT HEAD WITHOUT CONTRAST TECHNIQUE: Contiguous axial images were obtained from the base of the skull through the vertex without intravenous contrast. COMPARISON:  12/04/2016 FINDINGS: Brain: No intracranial hemorrhage, mass effect or midline shift. No definite acute cortical infarction. Ventricular size is stable from prior exam. No mass lesion is noted on this unenhanced scan. Vascular: Mild atherosclerotic calcifications of carotid siphon Skull: No skull fracture is noted. Sinuses/Orbits: Mucosal thickening with partial opacification bilateral ethmoid air cells. Mucosal thickening bilateral sphenoid  sinuses. Mild mucosal thickening maxillary sinuses. Other: None IMPRESSION: No acute intracranial abnormality. No definite acute cortical infarction. Paranasal sinuses disease as described above. Electronically Signed   By: Lahoma Crocker M.D.   On: 03/18/2017 09:47   Mr Jodene Nam Head Wo Contrast  Result Date: 03/19/2017 CLINICAL DATA:  Initial evaluation for acute stroke. Found to have punctate acute ischemic infarct within the medial right temporal lobe. EXAM: MRA HEAD WITHOUT CONTRAST TECHNIQUE: Angiographic images of the Circle of Willis were obtained using MRA technique without intravenous contrast. COMPARISON:  Prior MRI from 03/18/2017. FINDINGS: ANTERIOR CIRCULATION: Distal cervical segments of the internal carotid arteries are patent with antegrade flow. Petrous segments widely patent bilaterally. Scattered atheromatous irregularity throughout the cavernous right ICA with mild to moderate multifocal narrowing. No significant atheromatous stenosis within the cavernous left ICA. ICA termini widely patent. Left A1 segment widely patent. Right A1 segment hypoplastic and/ or absent, which likely accounts for the slightly diminutive right ICA is compared to the left. Anterior communicating artery normal. Anterior cerebral arteries patent to their distal aspects. Subtle attenuation of the distal ACA is felt to be artifactual due to motion. M1 segments widely patent without stenosis or occlusion. MCA bifurcations normal. No proximal M2 occlusion. Distal MCA branches well opacified and symmetric. POSTERIOR CIRCULATION: Left vertebral artery dominant and patent to the vertebrobasilar junction without stenosis. Right vertebral artery diminutive and largely terminates in PICA posterior inferior cerebral arteries themselves are patent proximally.  Basilar artery somewhat diminutive but patent to its distal aspect. Superior cerebral arteries patent bilaterally. Right PCA supplied via a hypoplastic P1 segment arising from the  basilar as well as a mildly prominent right posterior communicating artery. There is a fetal type left PCA supplied via a widely patent left posterior communicating artery. PCAs are patent to their distal aspects. No aneurysm or vascular malformation. IMPRESSION: 1. Negative intracranial MRA for large vessel occlusion. No high-grade or correctable stenosis. 2. Atheromatous irregularity within the cavernous right ICA with associated mild to moderate multifocal narrowing. 3. Hypoplastic/absent right A1 segment, with overall slightly diminutive right internal carotid artery system. Anterior cerebral artery supplied via the left carotid artery system. 4. Predominant fetal type PCAs, with overall diminutive vertebrobasilar system. Right vertebral artery largely terminates in PICA. Electronically Signed   By: Jeannine Boga M.D.   On: 03/19/2017 19:05   Mr Brain Wo Contrast  Result Date: 03/19/2017 CLINICAL DATA:  TIA.  Confusion and memory loss. EXAM: MRI HEAD WITHOUT CONTRAST TECHNIQUE: Multiplanar, multiecho pulse sequences of the brain and surrounding structures were obtained without intravenous contrast. COMPARISON:  Head CT 03/18/2017 FINDINGS: Brain: There is a 3 mm focus of diffusion restriction within the medial right temporal lobe at the hippocampus/para hippocampal gyrus. There is mild periventricular white matter hyperintensity. No other focal parenchymal signal abnormality. No mass lesion or midline shift. No hydrocephalus or extra-axial fluid collection. The midline structures are normal. No age advanced or lobar predominant atrophy. Vascular: Major intracranial arterial and venous sinus flow voids are preserved. No evidence of chronic microhemorrhage or amyloid angiopathy. Skull and upper cervical spine: The visualized skull base, calvarium, upper cervical spine and extracranial soft tissues are normal. Sinuses/Orbits: Diffuse mild mucosal thickening of the paranasal sinuses. Small amount of right  mastoid fluid. Normal orbits. IMPRESSION: 1. Punctate, 3 mm focus of acute ischemia within the medial right temporal lobe, near the hippocampus/para hippocampal gyrus. Ischemia in this area can be seen in the setting of acute memory loss. 2. No hemorrhage or mass effect. 3. Minimal findings of chronic microvascular disease for age. Electronically Signed   By: Ulyses Jarred M.D.   On: 03/19/2017 00:07      Subjective: Denies complaints. Still has no recollection of events that happened at work on the day of admission. As per spouse, mental status otherwise back to normal. Denies any other complaints.  Discharge Exam:  Vitals:   03/20/17 1320 03/20/17 1400 03/20/17 1401 03/20/17 1402  BP: 116/67 119/61 113/79 134/83  Pulse: (!) 56  76   Resp: 14     Temp: 97.8 F (36.6 C)     TempSrc: Oral     SpO2: 98%     Weight:      Height:        General: Pt lying comfortably in bed & appears in no obvious distress.Seen ambulating comfortably around his bed. Cardiovascular: S1 & S2 heard, RRR, S1/S2 +. No murmurs, rubs, gallops or clicks. No JVD or pedal edema. Telemetry: SB in the 50s with occasional PVCs. Respiratory: Clear to auscultation without wheezing, rhonchi or crackles. No increased work of breathing. Abdominal:  Non distended, non tender & soft. No organomegaly or masses appreciated. Normal bowel sounds heard. CNS: Alert and oriented. No focal deficits. Extremities: no edema, no cyanosis    The results of significant diagnostics from this hospitalization (including imaging, microbiology, ancillary and laboratory) are listed below for reference.     Microbiology: No results found for this or any  previous visit (from the past 240 hour(s)).   Labs: CBC:  Recent Labs Lab 03/18/17 0923 03/18/17 1855 03/19/17 0723  WBC 10.1 9.0 6.4  NEUTROABS 8.2*  --   --   HGB 17.3* 15.9 16.2  HCT 47.9 46.1 47.4  MCV 91.1 92.6 93.3  PLT 164 188 478   Basic Metabolic Panel:  Recent  Labs Lab 03/18/17 0923 03/18/17 1855 03/19/17 0723  NA 140  --  141  K 3.6  --  4.2  CL 103  --  104  CO2 27  --  29  GLUCOSE 103*  --  75  BUN 9  --  8  CREATININE 0.77 0.88 0.97  CALCIUM 9.6  --  9.3   Liver Function Tests:  Recent Labs Lab 03/18/17 0923  AST 21  ALT 17  ALKPHOS 65  BILITOT 0.7  PROT 7.1  ALBUMIN 4.0   Cardiac Enzymes:  Recent Labs Lab 03/18/17 0923  TROPONINI <0.03   CBG:  Recent Labs Lab 03/18/17 0915  GLUCAP 95   Hgb A1c  Recent Labs  03/18/17 1824 03/19/17 0723  HGBA1C 5.4 5.4   Lipid Profile  Recent Labs  03/19/17 0723  CHOL 162  HDL 43  LDLCALC 106*  TRIG 64  CHOLHDL 3.8   Urinalysis    Component Value Date/Time   COLORURINE YELLOW 03/18/2017 1000   APPEARANCEUR CLEAR 03/18/2017 1000   LABSPEC 1.004 (L) 03/18/2017 1000   PHURINE 7.5 03/18/2017 1000   GLUCOSEU NEGATIVE 03/18/2017 1000   HGBUR NEGATIVE 03/18/2017 1000   BILIRUBINUR NEGATIVE 03/18/2017 1000   BILIRUBINUR Neg 01/18/2014 1651   KETONESUR NEGATIVE 03/18/2017 1000   PROTEINUR NEGATIVE 03/18/2017 1000   UROBILINOGEN 0.2 01/18/2014 1651   NITRITE NEGATIVE 03/18/2017 1000   LEUKOCYTESUR NEGATIVE 03/18/2017 1000   Discussed with spouse at bedside. Updated care and answered questions.   Time coordinating discharge: Over 30 minutes  SIGNED:  Vernell Leep, MD, FACP, [place]. [facility] Pager [phone]  If 7PM-7AM, please contact night-coverage [email or web address] Password [email or web address] 03/20/2017, 6:10 PM

## 2017-03-20 NOTE — Progress Notes (Signed)
Preliminary results by tech - Carotid Duplex Completed. Mild plaque in bilateral carotid arteries without significant stenosis. Vertebral arteries demonstrated antegrade flow.  Ayzia Day, BS, RDMS, RVT  

## 2017-03-21 ENCOUNTER — Telehealth: Payer: Self-pay

## 2017-03-21 NOTE — Telephone Encounter (Addendum)
[facility] follow up call attempted. Left message for return call.

## 2017-03-21 NOTE — Telephone Encounter (Signed)
03/21/17   TCM Hospital Follow Up Call  Transition Care Management Follow-up Telephone Call  ADMISSION DATE: 03/18/17   DISCHARGE DATE: 03/20/17    How have you been since you were released from the hospital? Wife states patient has been doing well.       Do you understand why you were in the hospital? YES   Do you understand the discharge instrcutions? Yes  Items Reviewed:  Medications reviewed:  Medications reviewed.  Allergies reviewed: NKDA  Dietary changes reviewed:Heart Healthy  Referrals reviewed: PCP appoint scheduled. Neurology appointment scheduled.   Functional Questionnaire:   Activities of Daily Living (ADLs):  No help needed at this time   Any transportation issues/concerns?: No   Any patient concerns? None at this timel.   Confirmed importance and date/time of follow-up visits scheduled: Yes.   Confirmed with patient if condition begins to worsen call PCP or go to the ER. Yes    Patient was given the [place] line [phone]: Yes

## 2017-04-04 ENCOUNTER — Ambulatory Visit (INDEPENDENT_AMBULATORY_CARE_PROVIDER_SITE_OTHER): Payer: 59 | Admitting: Family Medicine

## 2017-04-04 ENCOUNTER — Encounter: Payer: Self-pay | Admitting: Family Medicine

## 2017-04-04 VITALS — BP 108/70 | HR 74 | Temp 98.1°F | Resp 16 | Ht 67.0 in | Wt 151.4 lb

## 2017-04-04 DIAGNOSIS — I639 Cerebral infarction, unspecified: Secondary | ICD-10-CM

## 2017-04-04 DIAGNOSIS — E782 Mixed hyperlipidemia: Secondary | ICD-10-CM | POA: Diagnosis not present

## 2017-04-04 DIAGNOSIS — I10 Essential (primary) hypertension: Secondary | ICD-10-CM

## 2017-04-04 MED ORDER — PRAVASTATIN SODIUM 20 MG PO TABS: 40.0000 mg | ORAL_TABLET | Freq: Every day | ORAL | 0 refills | Status: DC

## 2017-04-04 NOTE — Patient Instructions (Signed)
Ischemic Stroke An ischemic stroke is the sudden death of brain tissue. Blood carries oxygen to all areas of the body. This type of stroke happens when your blood does not flow to your brain like normal. Your brain cannot get the oxygen it needs. This is an emergency. It must be treated right away. Symptoms of a stroke usually happen all of a sudden. You may notice them when you wake up. They can include:  Weakness or loss of feeling in your face, arm, or leg. This often happens on one side of the body.  Trouble walking.  Trouble moving your arms or legs.  Loss of balance or coordination.  Feeling confused.  Trouble talking or understanding what people are saying.  Slurred speech.  Trouble seeing.  Seeing two of one object (double vision).  Feeling dizzy.  Feeling sick to your stomach (nauseous) and throwing up (vomiting).  A very bad headache for no reason. Get help as soon as any of these problems start. This is important. Some treatments work better if they are given right away. These include:  Aspirin.  Medicines to control blood pressure.  A shot (injection) of medicine to break up the blood clot.  Treatments given in the blood vessel (artery) to take out the clot or break it up. Other treatments may include:  Oxygen.  Fluids given through an IV tube.  Medicines to thin out your blood.  Procedures to help your blood flow better. What increases the risk? Certain things may make you more likely to have a stroke. Some of these are things that you can change, such as:  Being very overweight (obesity).  Smoking.  Taking birth control pills.  Not being active.  Drinking too much alcohol.  Using drugs. Other risk factors include:  High blood pressure.  High cholesterol.  Diabetes.  Heart disease.  Being African American, Native American, Hispanic, or [place] Native.  Being over age 60.  Family history of stroke.  Having had blood clots, stroke,  or warning stroke (transient ischemic attack, TIA) in the past.  Sickle cell disease.  Being a woman with a history of high blood pressure in pregnancy (preeclampsia).  Migraine headache.  Sleep apnea.  Having an irregular heartbeat (atrial fibrillation).  Long-term (chronic) diseases that cause soreness and swelling (inflammation).  Disorders that affect how your blood clots. Follow these instructions at home: Medicines  Take over-the-counter and prescription medicines only as told by your doctor.  If you were told to take aspirin or another medicine to thin your blood, take it exactly as told by your doctor.  Taking too much of the medicine can cause bleeding.  If you do not take enough, it may not work as well.  Know the side effects of your medicines. If you are taking a blood thinner, make sure you:  Hold pressure over any cuts for longer than usual.  Tell your dentist and other doctors that you take this medicine.  Avoid activities that may cause damage or injury to your body. Eating and drinking  Follow instructions from your doctor about what you cannot eat or drink.  Eat healthy foods.  If you have trouble with swallowing, do these things to avoid choking:  Take small bites when eating.  Eat foods that are soft or pureed. Safety  Follow instructions from your health care team about physical activity.  Use a walker or cane as told by your doctor.  Keep your home safe so you do not fall. This   may include:  Having experts look at your home to make sure it is safe.  Putting grab bars in the bedroom and bathroom.  Using raised toilets.  Putting a seat in the shower. General instructions  Do not use any tobacco products.  Examples of these are cigarettes, chewing tobacco, and e-cigarettes.  If you need help quitting, ask your doctor.  Limit how much alcohol you drink. This means no more than 1 drink a day for nonpregnant women and 2 drinks a day  for men. One drink equals 12 oz of beer, 5 oz of wine, or 1 oz of hard liquor.  If you need help to stop using drugs or alcohol, ask your doctor to refer you to a program or specialist.  Stay active. Exercise as told by your doctor.  Keep all follow-up visits as told by your doctor. This is important. Get help right away if:  You suddenly:  Have weakness or loss of feeling in your face, arm, or leg.  Feel confused.  Have trouble talking or understanding what people are saying.  Have trouble seeing.  Have trouble walking.  Have trouble moving your arms or legs.  Feel dizzy.  Lose your balance or coordination.  Have a very bad headache and you do not know why.  You pass out (lose consciousness) or almost pass out.  You have jerky movements that you cannot control (seizure). These symptoms may be an emergency. Do not wait to see if the symptoms will go away. Get medical help right away. Call your local emergency services (911 in the [place] Do not drive yourself to the hospital.  This information is not intended to replace advice given to you by your health care provider. Make sure you discuss any questions you have with your health care provider. Document Released: 11/15/2011 Document Revised: 05/08/2016 Document Reviewed: 02/22/2016 Elsevier Interactive Patient Education  2017 Elsevier Inc.  

## 2017-04-04 NOTE — Assessment & Plan Note (Signed)
Well controlled, no changes to meds. Encouraged heart healthy diet such as the DASH diet and exercise as tolerated.  °

## 2017-04-04 NOTE — Assessment & Plan Note (Signed)
Encouraged heart healthy diet, increase exercise, avoid trans fats, consider a krill oil cap daily con't statin 

## 2017-04-04 NOTE — Progress Notes (Signed)
Subjective:  I acted as a Education administrator for Dr. Royden Purl, LPN    Patient ID: Dione Housekeeper, male    DOB: 06-19-1956, 62 y.o.   MRN: [id number]  Chief Complaint  Patient presents with  . Hospitalization Follow-up    HPI  Patient is in today for hospital follow up due to a stroke. Pt report he had memory loss, and repeating questions over and over. Pt couldn't remember driving to work that morning. Pt report despite memory loss he have always felt fine.  Patient Care Team: [name], DO as PCP - General (Family Medicine)   Past Medical History:  Diagnosis Date  . High cholesterol   . Hypertension     Past Surgical History:  Procedure Laterality Date  . HERNIA REPAIR Left 2004    Family History  Problem Relation Age of Onset  . Heart disease Mother     Duanne Limerick  . Berenice Primas' disease Mother   . Heart disease Father   . Hyperlipidemia Brother   . Hypertension Brother   . Heart disease Paternal Uncle   . Heart disease Paternal Uncle   . Heart disease Paternal Uncle   . Heart disease Paternal Uncle   . Heart disease Paternal Uncle   . Heart disease Paternal Uncle   . Hypertension Brother   . Colon cancer Neg Hx     Social History   Social History  . Marital status: Married    Spouse name: N/A  . Number of children: N/A  . Years of education: N/A   Occupational History  . Not on file.   Social History Main Topics  . Smoking status: Former Smoker    Packs/day: 1.00    Years: 15.00    Types: Cigarettes    Quit date: 01/19/1988  . Smokeless tobacco: Current User    Types: Chew  . Alcohol use No  . Drug use: No  . Sexual activity: Yes   Other Topics Concern  . Not on file   Social History Narrative   Exercise-walking       Outpatient Medications Prior to Visit  Medication Sig Dispense Refill  . aspirin EC 325 MG tablet Take 1 tablet (325 mg total) by mouth daily. 30 tablet 0  . Flaxseed, Linseed, (FLAXSEED OIL) 1200 MG CAPS Take 2 capsules by  mouth daily.    Marland Kitchen lisinopril-hydrochlorothiazide (PRINZIDE,ZESTORETIC) 20-12.5 MG tablet TAKE TWO TABLETS BY MOUTH ONCE DAILY 60 tablet 5  . Omega-3 Fatty Acids (FISH OIL) 1200 MG CAPS Take by mouth daily.    . pravastatin (PRAVACHOL) 20 MG tablet Take 2 tablets (40 mg total) by mouth daily. 60 tablet 0   No facility-administered medications prior to visit.     No Known Allergies  Review of Systems  Constitutional: Negative for chills, fever and malaise/fatigue.  HENT: Negative for congestion and hearing loss.   Eyes: Negative for blurred vision and discharge.  Respiratory: Negative for cough, sputum production and shortness of breath.   Cardiovascular: Negative for chest pain, palpitations and leg swelling.  Gastrointestinal: Negative for abdominal pain, blood in stool, constipation, diarrhea, heartburn, nausea and vomiting.  Genitourinary: Negative for dysuria, frequency, hematuria and urgency.  Musculoskeletal: Negative for back pain, falls and myalgias.  Skin: Negative for rash.  Neurological: Negative for dizziness, sensory change, loss of consciousness, weakness and headaches.  Endo/Heme/Allergies: Negative for environmental allergies. Does not bruise/bleed easily.  Psychiatric/Behavioral: Negative for depression and suicidal ideas. The patient is not nervous/anxious and  does not have insomnia.        Objective:    Physical Exam  Constitutional: He is oriented to person, place, and time. Vital signs are normal. He appears well-developed and well-nourished. He is sleeping. No distress.  HENT:  Head: Normocephalic and atraumatic.  Mouth/Throat: Oropharynx is clear and moist.  Eyes: Conjunctivae and EOM are normal. Pupils are equal, round, and reactive to light.  Neck: Normal range of motion. Neck supple. No thyromegaly present.  Cardiovascular: Normal rate and regular rhythm.   No murmur heard. Pulmonary/Chest: Effort normal and breath sounds normal. No respiratory distress.  He has no wheezes. He has no rales. He exhibits no tenderness.  Abdominal: Soft. Bowel sounds are normal. There is no tenderness.  Musculoskeletal: Normal range of motion. He exhibits no edema, tenderness or deformity.  Neurological: He is alert and oriented to person, place, and time. He displays normal reflexes. No cranial nerve deficit. He exhibits normal muscle tone. Coordination normal.  Skin: Skin is warm and dry. He is not diaphoretic.  Psychiatric: He has a normal mood and affect. His behavior is normal. Judgment and thought content normal.  Nursing note and vitals reviewed.   BP 108/70 (BP Location: Left Arm, Patient Position: Sitting, Cuff Size: Normal)   Pulse 74   Temp 98.1 F (36.7 C) (Oral)   Resp 16   Ht 5\' 7"  (1.702 m)   Wt 151 lb 6.4 oz (68.7 kg)   SpO2 98%   BMI 23.71 kg/m  Wt Readings from Last 3 Encounters:  04/04/17 151 lb 6.4 oz (68.7 kg)  03/18/17 145 lb (65.8 kg)  01/24/17 152 lb (68.9 kg)   BP Readings from Last 3 Encounters:  04/04/17 108/70  03/20/17 134/83  01/24/17 126/86     Immunization History  Administered Date(s) Administered  . Tdap 12/11/2011    Health Maintenance  Topic Date Due  . COLONOSCOPY  05/07/2017  . INFLUENZA VACCINE  07/10/2017  . TETANUS/TDAP  05/21/2022  . Hepatitis C Screening  Completed  . HIV Screening  Completed    Lab Results  Component Value Date   WBC 6.4 03/19/2017   HGB 16.2 03/19/2017   HCT 47.4 03/19/2017   PLT 178 03/19/2017   GLUCOSE 75 03/19/2017   CHOL 162 03/19/2017   TRIG 64 03/19/2017   HDL 43 03/19/2017   LDLCALC 106 (H) 03/19/2017   ALT 17 03/18/2017   AST 21 03/18/2017   NA 141 03/19/2017   K 4.2 03/19/2017   CL 104 03/19/2017   CREATININE 0.97 03/19/2017   BUN 8 03/19/2017   CO2 29 03/19/2017   TSH 1.22 01/18/2014   PSA 1.03 01/18/2014   HGBA1C 5.4 03/19/2017    Lab Results  Component Value Date   TSH 1.22 01/18/2014   Lab Results  Component Value Date   WBC 6.4 03/19/2017     HGB 16.2 03/19/2017   HCT 47.4 03/19/2017   MCV 93.3 03/19/2017   PLT 178 03/19/2017   Lab Results  Component Value Date   NA 141 03/19/2017   K 4.2 03/19/2017   CO2 29 03/19/2017   GLUCOSE 75 03/19/2017   BUN 8 03/19/2017   CREATININE 0.97 03/19/2017   BILITOT 0.7 03/18/2017   ALKPHOS 65 03/18/2017   AST 21 03/18/2017   ALT 17 03/18/2017   PROT 7.1 03/18/2017   ALBUMIN 4.0 03/18/2017   CALCIUM 9.3 03/19/2017   ANIONGAP 8 03/19/2017   GFR 97.17 01/24/2017   Lab Results  Component Value Date   CHOL 162 03/19/2017   Lab Results  Component Value Date   HDL 43 03/19/2017   Lab Results  Component Value Date   LDLCALC 106 (H) 03/19/2017   Lab Results  Component Value Date   TRIG 64 03/19/2017   Lab Results  Component Value Date   CHOLHDL 3.8 03/19/2017   Lab Results  Component Value Date   HGBA1C 5.4 03/19/2017         Assessment & Plan:   Problem List Items Addressed This Visit      Unprioritized   HTN (hypertension)    Well controlled, no changes to meds. Encouraged heart healthy diet such as the DASH diet and exercise as tolerated.       Relevant Medications   pravastatin (PRAVACHOL) 20 MG tablet   Hyperlipidemia    Encouraged heart healthy diet, increase exercise, avoid trans fats, consider a krill oil cap daily con't statin       Relevant Medications   pravastatin (PRAVACHOL) 20 MG tablet   Ischemic stroke ([place]) - Primary    f/u neuro con't aspirin con't statin      Relevant Medications   pravastatin (PRAVACHOL) 20 MG tablet      I am having Mr. Tabb maintain his Flaxseed Oil, Fish Oil, lisinopril-hydrochlorothiazide, aspirin EC, and pravastatin.  Meds ordered this encounter  Medications  . pravastatin (PRAVACHOL) 20 MG tablet    Sig: Take 2 tablets (40 mg total) by mouth daily.    Dispense:  60 tablet    Refill:  0    CMA served as scribe during this visit. History, Physical and Plan performed by medical provider.  Documentation and orders reviewed and attested to.  [name], DO   Patient ID: EILAN MCINERNY, male   DOB: 04-14-1956, 61 y.o.   MRN: [id number]

## 2017-04-04 NOTE — Progress Notes (Signed)
Pre visit review using our clinic review tool, if applicable. No additional management support is needed unless otherwise documented below in the visit note. 

## 2017-04-04 NOTE — Assessment & Plan Note (Signed)
f/u neuro con't aspirin con't statin

## 2017-04-30 ENCOUNTER — Encounter: Payer: Self-pay | Admitting: Internal Medicine

## 2017-05-07 ENCOUNTER — Encounter: Payer: Self-pay | Admitting: Nurse Practitioner

## 2017-05-07 ENCOUNTER — Ambulatory Visit (INDEPENDENT_AMBULATORY_CARE_PROVIDER_SITE_OTHER): Payer: 59 | Admitting: Nurse Practitioner

## 2017-05-07 VITALS — BP 133/85 | HR 65 | Ht 67.0 in | Wt 154.0 lb

## 2017-05-07 DIAGNOSIS — I10 Essential (primary) hypertension: Secondary | ICD-10-CM | POA: Diagnosis not present

## 2017-05-07 DIAGNOSIS — E785 Hyperlipidemia, unspecified: Secondary | ICD-10-CM

## 2017-05-07 DIAGNOSIS — I639 Cerebral infarction, unspecified: Secondary | ICD-10-CM | POA: Diagnosis not present

## 2017-05-07 NOTE — Patient Instructions (Signed)
Stressed the importance of management of risk factors to prevent further stroke Continue Aspirin for secondary stroke prevention Maintain strict control of hypertension with blood pressure goal below 130/90, today's reading 133/85 continue antihypertensive medications Control of diabetes with hemoglobin A1c below 6.5 followed by primary care most recent hemoglobin A1c 5.4 Cholesterol with LDL cholesterol less than 70, followed by primary care,  most recent 106 continue statin drugs Pravachol Exercise by walking, , eat healthy diet with whole grains,  fresh fruits and vegetables Follow-up in 6 months

## 2017-05-07 NOTE — Progress Notes (Signed)
[facility]  PATIENT: Drew [name] DOB: 1956/11/10   REASON FOR VISIT: Hospital follow-up for stroke HISTORY FROM: Patient and wife    HISTORY OF PRESENT ILLNESS:(From record)Drew J Brownis an 61 y.o.malewith history of HTN, HLD presents to the ER with confusion and memory loss that lasted 2 hours. Pt recalls having a normal morning including waking up, taking his BP/cholesterol meds, going for a walk, ate breakfast and went to work. Following that, he cannot recall anything for about 2 hours. His boss called his wife saying that he wasn't acting right, was repeating things and seemed confused. He then drove him home. At this time patient felt back to normal but could not recall anything that happened in the 2 hours he was "at work." In the ED patient still does not recall the 2 hours that occurred. He feels that he is back to his baseline. Patient did obtain an MRI of his brain that showed a punctate, 3 mm focus of acute ischemia within the right temporal lobe and hippocampus/hippocampal gyrus. Ischemia in this area can be seen in the setting of acute memory loss. Patient was last known well 03/17/2017 at 21:30. Patient was not administered IV t-PA secondary to resolution of symptoms. He was admitted for further evaluation and treatment.EEG normal. MRA no large vessel occlusion. Right ICA atheroma with mild narrowing. Carotid Doppler no significant stenosis. 2-D echo with 55-60% EF. LDL 106. Hemoglobin A1c 5.4. No anti-thrombotic prior to admission. He returns to the stroke clinic for follow-up he remains on aspirin for secondary stroke prevention without further stroke or TIA symptoms. He has minimal bruising and no bleeding. Blood pressure in the office today 133/85. He is on pravastatin for hyperlipidemia without complaints of myalgias. He continues to exercise daily by walking. He returns for reevaluation  REVIEW OF SYSTEMS: Full 14 system review of systems performed  and notable only for those listed, all others are neg:  Constitutional: neg  Cardiovascular: neg Ear/Nose/Throat: neg  Skin: neg Eyes: neg Respiratory: neg Gastroitestinal: neg  Hematology/Lymphatic: neg  Endocrine: neg Musculoskeletal:neg Allergy/Immunology: neg Neurological: neg Psychiatric: neg Sleep : neg   ALLERGIES: No Known Allergies  HOME MEDICATIONS: Outpatient Medications Prior to Visit  Medication Sig Dispense Refill  . aspirin EC 325 MG tablet Take 1 tablet (325 mg total) by mouth daily. 30 tablet 0  . Flaxseed, Linseed, (FLAXSEED OIL) 1200 MG CAPS Take 2 capsules by mouth daily.    Marland Kitchen lisinopril-hydrochlorothiazide (PRINZIDE,ZESTORETIC) 20-12.5 MG tablet TAKE TWO TABLETS BY MOUTH ONCE DAILY 60 tablet 5  . Omega-3 Fatty Acids (FISH OIL) 1200 MG CAPS Take by mouth daily.    . pravastatin (PRAVACHOL) 20 MG tablet Take 2 tablets (40 mg total) by mouth daily. 60 tablet 0   No facility-administered medications prior to visit.     PAST MEDICAL HISTORY: Past Medical History:  Diagnosis Date  . High cholesterol   . Hypertension     PAST SURGICAL HISTORY: Past Surgical History:  Procedure Laterality Date  . HERNIA REPAIR Left 2004    FAMILY HISTORY: Family History  Problem Relation Age of Onset  . Heart disease Mother        Duanne Limerick  . Berenice Primas' disease Mother   . Heart disease Father   . Hyperlipidemia Brother   . Hypertension Brother   . Heart disease Paternal Uncle   . Heart disease Paternal Uncle   . Heart disease Paternal Uncle   . Heart disease Paternal Uncle   .  Heart disease Paternal Uncle   . Heart disease Paternal Uncle   . Hypertension Brother   . Colon cancer Neg Hx     SOCIAL HISTORY: Social History   Social History  . Marital status: Married    Spouse name: N/A  . Number of children: N/A  . Years of education: N/A   Occupational History  . Not on file.   Social History Main Topics  . Smoking status: Former Smoker    Packs/day:  1.00    Years: 15.00    Types: Cigarettes    Quit date: 01/19/1988  . Smokeless tobacco: Current User    Types: Chew  . Alcohol use No  . Drug use: No  . Sexual activity: Yes   Other Topics Concern  . Not on file   Social History Narrative   Exercise-walking        PHYSICAL EXAM  Vitals:   05/07/17 1007  BP: 133/85  Pulse: 65  Weight: 154 lb (69.9 kg)  Height: 5\' 7"  (1.702 m)   Body mass index is 24.12 kg/m.  Generalized: Well developed, in no acute distress  Head: normocephalic and atraumatic,. Oropharynx benign  Neck: Supple, no carotid bruits  Cardiac: Regular rate rhythm, no murmur  Musculoskeletal: No deformity   Neurological examination   Mentation: Alert oriented to time, place, history taking. Attention span and concentration appropriate. Recent and remote memory intact.  Follows all commands speech and language fluent.   Cranial nerve II-XII: Pupils were equal round reactive to light extraocular movements were full, visual field were full on confrontational test. Facial sensation and strength were normal. hearing was intact to finger rubbing bilaterally. Uvula tongue midline. head turning and shoulder shrug were normal and symmetric.Tongue protrusion into cheek strength was normal. Motor: normal bulk and tone, full strength in the BUE, BLE, fine finger movements normal, no pronator drift. No focal weakness Sensory: normal and symmetric to light touch, pinprick, and  Vibration, in the upper and lower extremities  Coordination: finger-nose-finger, heel-to-shin bilaterally, no dysmetria, no tremor Reflexes: 1+ upper lower and symmetric plantar responses were flexor bilaterally. Gait and Station: Rising up from seated position without assistance, normal stance,  moderate stride, good arm swing, smooth turning, able to perform tiptoe, and heel walking without difficulty. Tandem gait is steady  DIAGNOSTIC DATA (LABS, IMAGING, TESTING) - I reviewed patient records,  labs, notes, testing and imaging myself where available.  Lab Results  Component Value Date   WBC 6.4 03/19/2017   HGB 16.2 03/19/2017   HCT 47.4 03/19/2017   MCV 93.3 03/19/2017   PLT 178 03/19/2017      Component Value Date/Time   NA 141 03/19/2017 0723   K 4.2 03/19/2017 0723   CL 104 03/19/2017 0723   CO2 29 03/19/2017 0723   GLUCOSE 75 03/19/2017 0723   BUN 8 03/19/2017 0723   CREATININE 0.97 03/19/2017 0723   CALCIUM 9.3 03/19/2017 0723   PROT 7.1 03/18/2017 0923   ALBUMIN 4.0 03/18/2017 0923   AST 21 03/18/2017 0923   ALT 17 03/18/2017 0923   ALKPHOS 65 03/18/2017 0923   BILITOT 0.7 03/18/2017 0923   GFRNONAA >60 03/19/2017 0723   GFRAA >60 03/19/2017 0723   Lab Results  Component Value Date   CHOL 162 03/19/2017   HDL 43 03/19/2017   LDLCALC 106 (H) 03/19/2017   TRIG 64 03/19/2017   CHOLHDL 3.8 03/19/2017   Lab Results  Component Value Date   HGBA1C 5.4 03/19/2017  ASSESSMENT AND PLAN  61 y.o. year old male  has a past medical history of High cholesterol and Hypertension. here For hospital follow-up for stroke .MRI of his brain that showed a punctate, 3 mm focus of acute ischemia within the right temporal lobe and hippocampus/hippocampal gyrus.  PLAN: Stressed the importance of management of risk factors to prevent further stroke Continue Aspirin for secondary stroke prevention Maintain strict control of hypertension with blood pressure goal below 130/90, today's reading 133/85 continue antihypertensive medications Control of diabetes with hemoglobin A1c below 6.5 followed by primary care most recent hemoglobin A1c 5.4 Cholesterol with LDL cholesterol less than 70, followed by primary care,  most recent 106 continue statin drugs Pravachol Exercise by walking, , eat healthy diet with whole grains,  fresh fruits and vegetables Follow-up in 6 months Discussed risk for recurrent stroke/ TIA and answered additional questions This was a visit requiring 30  minutes and medical decision making of high complexity with extensive review of history, hospital chart, counseling and answering questions Dennie Bible, [facility], [facility], APRN  [facility] [address], [place], [place] [address] [phone]

## 2017-05-08 NOTE — Progress Notes (Signed)
I reviewed above note and agree with the assessment and plan.  [name], MD PhD Stroke Neurology 05/08/2017 7:12 AM

## 2017-05-21 ENCOUNTER — Other Ambulatory Visit: Payer: Self-pay | Admitting: Family Medicine

## 2017-05-21 DIAGNOSIS — E782 Mixed hyperlipidemia: Secondary | ICD-10-CM

## 2017-06-22 ENCOUNTER — Other Ambulatory Visit: Payer: Self-pay | Admitting: Family Medicine

## 2017-06-22 DIAGNOSIS — E782 Mixed hyperlipidemia: Secondary | ICD-10-CM

## 2017-07-04 ENCOUNTER — Encounter: Payer: Self-pay | Admitting: Family Medicine

## 2017-07-04 ENCOUNTER — Ambulatory Visit (INDEPENDENT_AMBULATORY_CARE_PROVIDER_SITE_OTHER): Payer: 59 | Admitting: Family Medicine

## 2017-07-04 DIAGNOSIS — E785 Hyperlipidemia, unspecified: Secondary | ICD-10-CM

## 2017-07-04 DIAGNOSIS — I10 Essential (primary) hypertension: Secondary | ICD-10-CM

## 2017-07-04 NOTE — Assessment & Plan Note (Signed)
Tolerating statin, encouraged heart healthy diet, avoid trans fats, minimize simple carbs and saturated fats. Increase exercise as tolerated 

## 2017-07-04 NOTE — Assessment & Plan Note (Signed)
Well controlled, no changes to meds. Encouraged heart healthy diet such as the DASH diet and exercise as tolerated.  °

## 2017-07-04 NOTE — Patient Instructions (Signed)

## 2017-07-04 NOTE — Progress Notes (Signed)
Patient ID: Drew [name], male   DOB: 1956/03/05, 61 y.o.   MRN: [id number]     Subjective:  I acted as a Education administrator for Dr. Carollee Herter.  [name], [place]   Patient ID: Drew [name], male    DOB: 1956-09-20, 61 y.o.   MRN: [id number]  Chief Complaint  Patient presents with  . Hypertension  . Hyperlipidemia  . Abrasion    left lower arm     HPI  Patient is in today for follow up blood pressure and cholesterol.  He has been doing well on current treatment for cholesterol with no side effects.  He has a scrap on left arm that happened about a week ago.   Patient Care Team: Carollee Herter, Alferd Apa, DO as PCP - General (Family Medicine)   Past Medical History:  Diagnosis Date  . High cholesterol   . Hypertension     Past Surgical History:  Procedure Laterality Date  . HERNIA REPAIR Left 2004    Family History  Problem Relation Age of Onset  . Heart disease Mother        Duanne Limerick  . Berenice Primas' disease Mother   . Heart disease Father   . Hyperlipidemia Brother   . Hypertension Brother   . Heart disease Paternal Uncle   . Heart disease Paternal Uncle   . Heart disease Paternal Uncle   . Heart disease Paternal Uncle   . Heart disease Paternal Uncle   . Heart disease Paternal Uncle   . Hypertension Brother   . Colon cancer Neg Hx     Social History   Social History  . Marital status: Married    Spouse name: N/A  . Number of children: N/A  . Years of education: N/A   Occupational History  . Not on file.   Social History Main Topics  . Smoking status: Former Smoker    Packs/day: 1.00    Years: 15.00    Types: Cigarettes    Quit date: 01/19/1988  . Smokeless tobacco: Current User    Types: Chew  . Alcohol use No  . Drug use: No  . Sexual activity: Yes   Other Topics Concern  . Not on file   Social History Narrative   Exercise-walking       Outpatient Medications Prior to Visit  Medication Sig Dispense Refill  . aspirin EC 325 MG tablet Take 1 tablet (325 mg  total) by mouth daily. 30 tablet 0  . Flaxseed, Linseed, (FLAXSEED OIL) 1200 MG CAPS Take 2 capsules by mouth daily.    Marland Kitchen lisinopril-hydrochlorothiazide (PRINZIDE,ZESTORETIC) 20-12.5 MG tablet TAKE TWO TABLETS BY MOUTH ONCE DAILY 60 tablet 5  . Omega-3 Fatty Acids (FISH OIL) 1200 MG CAPS Take by mouth daily.    . pravastatin (PRAVACHOL) 20 MG tablet TAKE 2 TABLETS BY MOUTH ONCE DAILY 60 tablet 0   No facility-administered medications prior to visit.     No Known Allergies  Review of Systems  Constitutional: Negative for fever and malaise/fatigue.  HENT: Negative for congestion.   Eyes: Negative for blurred vision.  Respiratory: Negative for cough and shortness of breath.   Cardiovascular: Negative for chest pain, palpitations and leg swelling.  Gastrointestinal: Negative for vomiting.  Musculoskeletal: Negative for back pain.  Skin: Negative for rash.       Scrape lower left arm   Neurological: Negative for loss of consciousness and headaches.       Objective:    Physical Exam  Constitutional: He appears  well-developed and well-nourished. No distress.  HENT:  Head: Normocephalic and atraumatic.  Eyes: Conjunctivae are normal.  Neck: Normal range of motion. No thyromegaly present.  Cardiovascular: Normal rate and regular rhythm.   Pulmonary/Chest: Effort normal. He has no wheezes.  Abdominal: Soft. Bowel sounds are normal. There is no tenderness.  Musculoskeletal: Normal range of motion. He exhibits no edema or deformity.  Neurological: He is alert.  Skin: Skin is warm and dry. He is not diaphoretic.  Psychiatric: He has a normal mood and affect.    BP 120/80 (BP Location: Left Arm, Cuff Size: Normal)   Pulse 69   Temp 98.4 F (36.9 C) (Oral)   Resp 16   Ht 5\' 7"  (1.702 m)   Wt 149 lb 3.2 oz (67.7 kg)   SpO2 97%   BMI 23.37 kg/m  Wt Readings from Last 3 Encounters:  07/04/17 149 lb 3.2 oz (67.7 kg)  05/07/17 154 lb (69.9 kg)  04/04/17 151 lb 6.4 oz (68.7 kg)    BP Readings from Last 3 Encounters:  07/04/17 120/80  05/07/17 133/85  04/04/17 108/70     Immunization History  Administered Date(s) Administered  . Tdap 12/11/2011    Health Maintenance  Topic Date Due  . COLONOSCOPY  05/07/2017  . INFLUENZA VACCINE  07/10/2017  . TETANUS/TDAP  05/21/2022  . Hepatitis C Screening  Completed  . HIV Screening  Completed    Lab Results  Component Value Date   WBC 6.4 03/19/2017   HGB 16.2 03/19/2017   HCT 47.4 03/19/2017   PLT 178 03/19/2017   GLUCOSE 75 03/19/2017   CHOL 162 03/19/2017   TRIG 64 03/19/2017   HDL 43 03/19/2017   LDLCALC 106 (H) 03/19/2017   ALT 17 03/18/2017   AST 21 03/18/2017   NA 141 03/19/2017   K 4.2 03/19/2017   CL 104 03/19/2017   CREATININE 0.97 03/19/2017   BUN 8 03/19/2017   CO2 29 03/19/2017   TSH 1.22 01/18/2014   PSA 1.03 01/18/2014   HGBA1C 5.4 03/19/2017    Lab Results  Component Value Date   TSH 1.22 01/18/2014   Lab Results  Component Value Date   WBC 6.4 03/19/2017   HGB 16.2 03/19/2017   HCT 47.4 03/19/2017   MCV 93.3 03/19/2017   PLT 178 03/19/2017   Lab Results  Component Value Date   NA 141 03/19/2017   K 4.2 03/19/2017   CO2 29 03/19/2017   GLUCOSE 75 03/19/2017   BUN 8 03/19/2017   CREATININE 0.97 03/19/2017   BILITOT 0.7 03/18/2017   ALKPHOS 65 03/18/2017   AST 21 03/18/2017   ALT 17 03/18/2017   PROT 7.1 03/18/2017   ALBUMIN 4.0 03/18/2017   CALCIUM 9.3 03/19/2017   ANIONGAP 8 03/19/2017   GFR 97.17 01/24/2017   Lab Results  Component Value Date   CHOL 162 03/19/2017   Lab Results  Component Value Date   HDL 43 03/19/2017   Lab Results  Component Value Date   LDLCALC 106 (H) 03/19/2017   Lab Results  Component Value Date   TRIG 64 03/19/2017   Lab Results  Component Value Date   CHOLHDL 3.8 03/19/2017   Lab Results  Component Value Date   HGBA1C 5.4 03/19/2017         Assessment & Plan:   Problem List Items Addressed This Visit       Unprioritized   HTN (hypertension)    Well controlled, no changes to meds.  Encouraged heart healthy diet such as the DASH diet and exercise as tolerated.       Relevant Orders   Comprehensive metabolic panel   Lipid panel   Hyperlipidemia    Tolerating statin, encouraged heart healthy diet, avoid trans fats, minimize simple carbs and saturated fats. Increase exercise as tolerated      Relevant Orders   Comprehensive metabolic panel   Lipid panel      I am having Drew [name] maintain his Flaxseed Oil, Fish Oil, lisinopril-hydrochlorothiazide, aspirin EC, and pravastatin.  No orders of the defined types were placed in this encounter.   CMA served as Education administrator during this visit. History, Physical and Plan performed by medical provider. Documentation and orders reviewed and attested to.  [name], DO

## 2017-07-05 LAB — COMPREHENSIVE METABOLIC PANEL
ALBUMIN: 3.9 g/dL (ref 3.5–5.2)
ALK PHOS: 53 U/L (ref 39–117)
ALT: 15 U/L (ref 0–53)
AST: 18 U/L (ref 0–37)
BILIRUBIN TOTAL: 1.1 mg/dL (ref 0.2–1.2)
BUN: 9 mg/dL (ref 6–23)
CO2: 30 mEq/L (ref 19–32)
Calcium: 9.5 mg/dL (ref 8.4–10.5)
Chloride: 104 mEq/L (ref 96–112)
Creatinine, Ser: 0.9 mg/dL (ref 0.40–1.50)
GFR: 90.83 mL/min (ref >60.00)
Glucose, Bld: 79 mg/dL (ref 70–99)
Potassium: 3.5 mEq/L (ref 3.5–5.1)
Sodium: 141 mEq/L (ref 135–145)
TOTAL PROTEIN: 6.6 g/dL (ref 6.0–8.3)

## 2017-07-05 LAB — LIPID PANEL
CHOLESTEROL: 141 mg/dL (ref 0–200)
HDL: 46.9 mg/dL (ref >39.00)
LDL Cholesterol: 83 mg/dL (ref 0–99)
NonHDL: 93.96
TRIGLYCERIDES: 54 mg/dL (ref 0.0–149.0)
Total CHOL/HDL Ratio: 3
VLDL: 10.8 mg/dL (ref 0.0–40.0)

## 2017-07-08 ENCOUNTER — Other Ambulatory Visit: Payer: Self-pay | Admitting: Family Medicine

## 2017-07-08 DIAGNOSIS — E785 Hyperlipidemia, unspecified: Secondary | ICD-10-CM

## 2017-07-23 ENCOUNTER — Other Ambulatory Visit: Payer: Self-pay | Admitting: Family Medicine

## 2017-07-23 DIAGNOSIS — E782 Mixed hyperlipidemia: Secondary | ICD-10-CM

## 2017-08-19 ENCOUNTER — Other Ambulatory Visit: Payer: Self-pay | Admitting: Family Medicine

## 2017-08-19 DIAGNOSIS — E782 Mixed hyperlipidemia: Secondary | ICD-10-CM

## 2017-08-20 ENCOUNTER — Other Ambulatory Visit: Payer: Self-pay | Admitting: Family Medicine

## 2017-08-20 DIAGNOSIS — E782 Mixed hyperlipidemia: Secondary | ICD-10-CM

## 2017-08-20 NOTE — Telephone Encounter (Signed)
This was already faxed/thx dmf 

## 2017-08-28 ENCOUNTER — Encounter: Payer: Self-pay | Admitting: *Deleted

## 2017-09-17 ENCOUNTER — Other Ambulatory Visit: Payer: Self-pay | Admitting: Family Medicine

## 2017-09-17 DIAGNOSIS — E782 Mixed hyperlipidemia: Secondary | ICD-10-CM

## 2017-09-18 ENCOUNTER — Encounter: Payer: Self-pay | Admitting: Internal Medicine

## 2017-09-18 ENCOUNTER — Ambulatory Visit (INDEPENDENT_AMBULATORY_CARE_PROVIDER_SITE_OTHER): Payer: 59 | Admitting: Internal Medicine

## 2017-09-18 VITALS — BP 124/76 | HR 68 | Ht 67.0 in | Wt 151.6 lb

## 2017-09-18 DIAGNOSIS — Z86010 Personal history of colonic polyps: Secondary | ICD-10-CM | POA: Diagnosis not present

## 2017-09-18 DIAGNOSIS — Z8673 Personal history of transient ischemic attack (TIA), and cerebral infarction without residual deficits: Secondary | ICD-10-CM

## 2017-09-18 MED ORDER — SUPREP BOWEL PREP KIT 17.5-3.13-1.6 GM/177ML PO SOLN: 1.0000 | ORAL | 0 refills | Status: DC

## 2017-09-18 NOTE — Progress Notes (Signed)
Patient ID: Drew [name], male   DOB: 01/15/1956, 61 y.o.   MRN: [id number] HPI: Drew [name] is a 61 year old male with a past medical history of tubular adenomas at screening colonoscopy 3 years ago, hypertension, hyperlipidemia and stroke occurring in April 2018 who is seen to discuss surveillance colonoscopy. He is here alone today.  Due to his stroke earlier this year he is being seen in the office. He reports having a stroke which affected his memory only and has had no subsequent memory loss or physical deficits since. He was seen by neurology and started on full dose aspirin which he is taken. He's had no further neurologic events.  He reports feeling well with no GI complaint. Normal bowel habits without blood in his stool or melena. No diarrhea or constipation. No abdominal pain. No upper GI or hepatobiliary complaint.  Colonoscopy was performed on 05/07/2014. 3 sessile polyps measuring in size from 3-6 mm are found in the ascending colon and transverse colon. These were tubular adenomas. There was mild diverticulosis in the sigmoid colon.  Past Medical History:  Diagnosis Date  . CVA (cerebral vascular accident) ([place])   . High cholesterol   . Hypertension   . Memory loss   . Tubular adenoma of colon     Past Surgical History:  Procedure Laterality Date  . HERNIA REPAIR Left 2004    Outpatient Medications Prior to Visit  Medication Sig Dispense Refill  . aspirin EC 325 MG tablet Take 1 tablet (325 mg total) by mouth daily. 30 tablet 0  . Flaxseed, Linseed, (FLAXSEED OIL) 1200 MG CAPS Take 2 capsules by mouth daily.    Marland Kitchen lisinopril-hydrochlorothiazide (PRINZIDE,ZESTORETIC) 20-12.5 MG tablet TAKE TWO TABLETS BY MOUTH ONCE DAILY 60 tablet 5  . Omega-3 Fatty Acids (FISH OIL) 1200 MG CAPS Take by mouth daily.    . pravastatin (PRAVACHOL) 20 MG tablet TAKE 2 TABLETS BY MOUTH ONCE DAILY 60 tablet 0   No facility-administered medications prior to visit.     No Known  Allergies  Family History  Problem Relation Age of Onset  . Heart disease Mother        Duanne Limerick  . Berenice Primas' disease Mother   . Heart disease Father   . Hyperlipidemia Brother   . Hypertension Brother   . Heart disease Paternal Uncle   . Heart disease Paternal Uncle   . Heart disease Paternal Uncle   . Heart disease Paternal Uncle   . Heart disease Paternal Uncle   . Heart disease Paternal Uncle   . Hypertension Brother   . Colon cancer Neg Hx     Social History  Substance Use Topics  . Smoking status: Former Smoker    Packs/day: 1.00    Years: 15.00    Types: Cigarettes    Quit date: 01/19/1988  . Smokeless tobacco: Current User    Types: Chew  . Alcohol use No    ROS: As per history of present illness, otherwise negative  BP 124/76   Pulse 68   Ht 5\' 7"  (1.702 m)   Wt 151 lb 9.6 oz (68.8 kg)   BMI 23.74 kg/m  Constitutional: Well-developed and well-nourished. No distress. HEENT: Normocephalic and atraumatic. Conjunctivae are normal.  No scleral icterus. Neck: Neck supple. Trachea midline. Cardiovascular: Normal rate, regular rhythm and intact distal pulses. No M/R/G Pulmonary/chest: Effort normal and breath sounds normal. No wheezing, rales or rhonchi. Abdominal: Soft, nontender, nondistended. Bowel sounds active throughout. There are no masses palpable. No hepatosplenomegaly.  Extremities: no clubbing, cyanosis, or edema Neurological: Alert and oriented to person place and time. Skin: Skin is warm and dry.  Psychiatric: Normal mood and affect. Behavior is normal.  RELEVANT LABS AND IMAGING: CBC    Component Value Date/Time   WBC 6.4 03/19/2017 0723   RBC 5.08 03/19/2017 0723   HGB 16.2 03/19/2017 0723   HCT 47.4 03/19/2017 0723   PLT 178 03/19/2017 0723   MCV 93.3 03/19/2017 0723   MCH 31.9 03/19/2017 0723   MCHC 34.2 03/19/2017 0723   RDW 11.9 03/19/2017 0723   LYMPHSABS 1.2 03/18/2017 0923   MONOABS 0.6 03/18/2017 0923   EOSABS 0.2 03/18/2017 0923    BASOSABS 0.0 03/18/2017 0923    CMP     Component Value Date/Time   NA 141 07/04/2017 1447   K 3.5 07/04/2017 1447   CL 104 07/04/2017 1447   CO2 30 07/04/2017 1447   GLUCOSE 79 07/04/2017 1447   BUN 9 07/04/2017 1447   CREATININE 0.90 07/04/2017 1447   CALCIUM 9.5 07/04/2017 1447   PROT 6.6 07/04/2017 1447   ALBUMIN 3.9 07/04/2017 1447   AST 18 07/04/2017 1447   ALT 15 07/04/2017 1447   ALKPHOS 53 07/04/2017 1447   BILITOT 1.1 07/04/2017 1447   GFRNONAA >60 03/19/2017 0723   GFRAA >60 03/19/2017 0723    ASSESSMENT/PLAN:  61 year old male with a past medical history of tubular adenomas at screening colonoscopy 3 years ago, hypertension, hyperlipidemia and stroke occurring in April 2018 who is seen to discuss surveillance colonoscopy.   1. History of adenomatous colon polyps -- fortunately his neurologic event/stroke from April 2018 resolved completely. He is on full dose aspirin. Surveillance colonoscopy is appropriate at this time. This will be performed in the [facility].  We discussed the risks, benefits and alternatives and he is agreeable and wishes to proceed.    ZH:[facility], [place], [address] [place], [place] [address]

## 2017-09-18 NOTE — Patient Instructions (Signed)
You have been scheduled for a colonoscopy. Please follow written instructions given to you at your visit today.  Please pick up your prep supplies at the pharmacy within the next 1-3 days. If you use inhalers (even only as needed), please bring them with you on the day of your procedure. Your physician has requested that you go to www.startemmi.com and enter the access code given to you at your visit today. This web site gives a general overview about your procedure. However, you should still follow specific instructions given to you by our office regarding your preparation for the procedure.  If you are age 57 or older, your body mass index should be between 23-30. Your Body mass index is 23.74 kg/m. If this is out of the aforementioned range listed, please consider follow up with your Primary Care Provider.  If you are age 72 or younger, your body mass index should be between 19-25. Your Body mass index is 23.74 kg/m. If this is out of the aformentioned range listed, please consider follow up with your Primary Care Provider.

## 2017-10-07 ENCOUNTER — Ambulatory Visit (AMBULATORY_SURGERY_CENTER): Payer: 59 | Admitting: Internal Medicine

## 2017-10-07 ENCOUNTER — Encounter: Payer: Self-pay | Admitting: Internal Medicine

## 2017-10-07 VITALS — BP 121/78 | HR 52 | Temp 98.6°F | Resp 16 | Ht 67.0 in | Wt 151.0 lb

## 2017-10-07 DIAGNOSIS — I10 Essential (primary) hypertension: Secondary | ICD-10-CM | POA: Diagnosis not present

## 2017-10-07 DIAGNOSIS — Z86010 Personal history of colonic polyps: Secondary | ICD-10-CM | POA: Diagnosis not present

## 2017-10-07 DIAGNOSIS — D124 Benign neoplasm of descending colon: Secondary | ICD-10-CM

## 2017-10-07 DIAGNOSIS — Z8673 Personal history of transient ischemic attack (TIA), and cerebral infarction without residual deficits: Secondary | ICD-10-CM | POA: Diagnosis not present

## 2017-10-07 MED ORDER — SODIUM CHLORIDE 0.9 % IV SOLN: 500.0000 mL | INTRAVENOUS | Status: DC

## 2017-10-07 NOTE — Op Note (Signed)
[facility] Patient Name: Drew [name] Procedure Date: 10/07/2017 2:40 PM MRN: [id number] Endoscopist: Jerene Bears , MD Age: 61 Referring MD:  Date of Birth: 07/23/1956 Gender: Male Account #: [id number] Procedure:                Colonoscopy Indications:              Surveillance: Personal history of adenomatous                            polyps on last colonoscopy 3 years ago Medicines:                Monitored Anesthesia Care Procedure:                Pre-Anesthesia Assessment:                           - Prior to the procedure, a History and Physical                            was performed, and patient medications and                            allergies were reviewed. The patient's tolerance of                            previous anesthesia was also reviewed. The risks                            and benefits of the procedure and the sedation                            options and risks were discussed with the patient.                            All questions were answered, and informed consent                            was obtained. Prior Anticoagulants: The patient has                            taken no previous anticoagulant or antiplatelet                            agents. ASA Grade Assessment: II - A patient with                            mild systemic disease. After reviewing the risks                            and benefits, the patient was deemed in                            satisfactory condition to undergo the procedure.  After obtaining informed consent, the colonoscope                            was passed under direct vision. Throughout the                            procedure, the patient's blood pressure, pulse, and                            oxygen saturations were monitored continuously. The                            Colonoscope was introduced through the anus and                            advanced to the the cecum,  identified by                            appendiceal orifice and ileocecal valve. The                            colonoscopy was performed without difficulty. The                            patient tolerated the procedure well. The quality                            of the bowel preparation was good. The ileocecal                            valve, appendiceal orifice, and rectum were                            photographed. Scope In: 2:77:82 PM Scope Out: 3:06:44 PM Scope Withdrawal Time: 0 hours 11 minutes 4 seconds  Total Procedure Duration: 0 hours 20 minutes 40 seconds  Findings:                 A 6 mm polyp was found in the descending colon. The                            polyp was sessile. The polyp was removed with a                            cold snare. Resection and retrieval were complete.                           Multiple small-mouthed diverticula were found in                            the sigmoid colon.                           The exam was otherwise without abnormality on  direct and retroflexion views. Complications:            No immediate complications. Estimated Blood Loss:     Estimated blood loss was minimal. Impression:               - One 6 mm polyp in the descending colon, removed                            with a cold snare. Resected and retrieved.                           - Mild diverticulosis in the sigmoid colon.                           - The examination was otherwise normal on direct                            and retroflexion views. Recommendation:           - Patient has a contact number available for                            emergencies. The signs and symptoms of potential                            delayed complications were discussed with the                            patient. Return to normal activities tomorrow.                            Written discharge instructions were provided to the                             patient.                           - Resume previous diet.                           - Continue present medications.                           - Await pathology results.                           - Repeat colonoscopy is recommended for                            surveillance. The colonoscopy date will be                            determined after pathology results from today's                            exam become available for review. Jerene Bears, MD 10/07/2017 3:12:28 PM This report has been  signed electronically.

## 2017-10-07 NOTE — Progress Notes (Signed)
Report given to PACU, vss 

## 2017-10-07 NOTE — Progress Notes (Signed)
Called to room to assist during endoscopic procedure.  Patient ID and intended procedure confirmed with present staff. Received instructions for my participation in the procedure from the performing physician.  

## 2017-10-07 NOTE — Patient Instructions (Signed)
**   Handouts given on polyps and diverticulosis **   YOU HAD AN ENDOSCOPIC PROCEDURE TODAY AT THE [facility]:   Refer to the procedure report that was given to you for any specific questions about what was found during the examination.  If the procedure report does not answer your questions, please call your gastroenterologist to clarify.  If you requested that your care partner not be given the details of your procedure findings, then the procedure report has been included in a sealed envelope for you to review at your convenience later.  YOU SHOULD EXPECT: Some feelings of bloating in the abdomen. Passage of more gas than usual.  Walking can help get rid of the air that was put into your GI tract during the procedure and reduce the bloating. If you had a lower endoscopy (such as a colonoscopy or flexible sigmoidoscopy) you may notice spotting of blood in your stool or on the toilet paper. If you underwent a bowel prep for your procedure, you may not have a normal bowel movement for a few days.  Please Note:  You might notice some irritation and congestion in your nose or some drainage.  This is from the oxygen used during your procedure.  There is no need for concern and it should clear up in a day or so.  SYMPTOMS TO REPORT IMMEDIATELY:   Following lower endoscopy (colonoscopy or flexible sigmoidoscopy):  Excessive amounts of blood in the stool  Significant tenderness or worsening of abdominal pains  Swelling of the abdomen that is new, acute  Fever of 100F or higher  For urgent or emergent issues, a gastroenterologist can be reached at any hour by calling [phone].   DIET:  We do recommend a small meal at first, but then you may proceed to your regular diet.  Drink plenty of fluids but you should avoid alcoholic beverages for 24 hours.  ACTIVITY:  You should plan to take it easy for the rest of today and you should NOT DRIVE or use heavy machinery until tomorrow (because  of the sedation medicines used during the test).    FOLLOW UP: Our staff will call the number listed on your records the next business day following your procedure to check on you and address any questions or concerns that you may have regarding the information given to you following your procedure. If we do not reach you, we will leave a message.  However, if you are feeling well and you are not experiencing any problems, there is no need to return our call.  We will assume that you have returned to your regular daily activities without incident.  If any biopsies were taken you will be contacted by phone or by letter within the next 1-3 weeks.  Please call us at [phone] if you have not heard about the biopsies in 3 weeks.    SIGNATURES/CONFIDENTIALITY: You and/or your care partner have signed paperwork which will be entered into your electronic medical record.  These signatures attest to the fact that that the information above on your After Visit Summary has been reviewed and is understood.  Full responsibility of the confidentiality of this discharge information lies with you and/or your care-partner. 

## 2017-10-08 ENCOUNTER — Telehealth: Payer: Self-pay

## 2017-10-08 NOTE — Telephone Encounter (Signed)
  Follow up Call-  Call Ely Spragg number 10/07/2017  Post procedure Call L[facility]hes phone  # [phone]  Permission to leave phone message Yes  Some recent data might be hidden     Patient questions:  Do you have a fever, pain , or abdominal swelling? No. Pain Score  0 *  Have you tolerated food without any problems? Yes.    Have you been able to return to your normal activities? Yes.    Do you have any questions about your discharge instructions: Diet   No. Medications  No. Follow up visit  No.  Do you have questions or concerns about your Care? No.  Actions: * If pain score is 4 or above: No action needed, pain <4.

## 2017-10-14 ENCOUNTER — Encounter: Payer: Self-pay | Admitting: Internal Medicine

## 2017-10-19 ENCOUNTER — Other Ambulatory Visit: Payer: Self-pay | Admitting: Family Medicine

## 2017-10-19 DIAGNOSIS — E782 Mixed hyperlipidemia: Secondary | ICD-10-CM

## 2017-11-07 ENCOUNTER — Ambulatory Visit: Payer: 59 | Admitting: Nurse Practitioner

## 2017-11-07 ENCOUNTER — Encounter: Payer: 59 | Admitting: Internal Medicine

## 2017-11-11 ENCOUNTER — Encounter: Payer: 59 | Admitting: Internal Medicine

## 2017-11-19 ENCOUNTER — Other Ambulatory Visit: Payer: Self-pay | Admitting: Family Medicine

## 2017-11-19 DIAGNOSIS — E782 Mixed hyperlipidemia: Secondary | ICD-10-CM

## 2017-11-25 ENCOUNTER — Other Ambulatory Visit: Payer: Self-pay | Admitting: *Deleted

## 2017-11-25 MED ORDER — PRAVASTATIN SODIUM 40 MG PO TABS: 40.0000 mg | ORAL_TABLET | Freq: Every day | ORAL | 1 refills | Status: DC

## 2018-01-22 ENCOUNTER — Other Ambulatory Visit: Payer: Self-pay | Admitting: Family Medicine

## 2018-01-22 DIAGNOSIS — I10 Essential (primary) hypertension: Secondary | ICD-10-CM

## 2018-01-30 ENCOUNTER — Ambulatory Visit: Payer: Self-pay | Admitting: Nurse Practitioner

## 2018-02-17 ENCOUNTER — Ambulatory Visit (INDEPENDENT_AMBULATORY_CARE_PROVIDER_SITE_OTHER): Payer: 59 | Admitting: Family Medicine

## 2018-02-17 ENCOUNTER — Encounter: Payer: Self-pay | Admitting: Family Medicine

## 2018-02-17 DIAGNOSIS — I10 Essential (primary) hypertension: Secondary | ICD-10-CM | POA: Diagnosis not present

## 2018-02-17 DIAGNOSIS — E785 Hyperlipidemia, unspecified: Secondary | ICD-10-CM

## 2018-02-17 NOTE — Progress Notes (Signed)
Subjective:  I acted as a Education administrator for [employer]. Yancey Flemings, [place]   Patient ID: Drew [name], male    DOB: 04/15/1956, 62 y.o.   MRN: [id number]  Chief Complaint  Patient presents with  . Follow-up    HPI  Patient is in today for medication follow up for bp and cholesterol.   No complaints  Patient Care Team: Carollee Herter, Alferd Apa, DO as PCP - General (Family Medicine)   Past Medical History:  Diagnosis Date  . CVA (cerebral vascular accident) ([place])   . High cholesterol   . Hypertension   . Memory loss   . Tubular adenoma of colon     Past Surgical History:  Procedure Laterality Date  . COLONOSCOPY    . HERNIA REPAIR Left 2004  . POLYPECTOMY      Family History  Problem Relation Age of Onset  . Heart disease Mother        Duanne Limerick  . Berenice Primas' disease Mother   . Heart disease Father   . Hyperlipidemia Brother   . Hypertension Brother   . Heart disease Paternal Uncle   . Heart disease Paternal Uncle   . Heart disease Paternal Uncle   . Heart disease Paternal Uncle   . Heart disease Paternal Uncle   . Heart disease Paternal Uncle   . Hypertension Brother   . Breast cancer Sister   . Colon cancer Neg Hx     Social History   Socioeconomic History  . Marital status: Married    Spouse name: Not on file  . Number of children: Not on file  . Years of education: Not on file  . Highest education level: Not on file  Social Needs  . Financial resource strain: Not on file  . Food insecurity - worry: Not on file  . Food insecurity - inability: Not on file  . Transportation needs - medical: Not on file  . Transportation needs - non-medical: Not on file  Occupational History  . Not on file  Tobacco Use  . Smoking status: Former Smoker    Packs/day: 1.00    Years: 15.00    Pack years: 15.00    Types: Cigarettes    Last attempt to quit: 01/19/1988    Years since quitting: 30.1  . Smokeless tobacco: Current User    Types: Chew  Substance and Sexual Activity  .  Alcohol use: No  . Drug use: No  . Sexual activity: Yes  Other Topics Concern  . Not on file  Social History Narrative   Exercise-walking    Outpatient Medications Prior to Visit  Medication Sig Dispense Refill  . aspirin EC 325 MG tablet Take 1 tablet (325 mg total) by mouth daily. 30 tablet 0  . Flaxseed, Linseed, (FLAXSEED OIL) 1200 MG CAPS Take 2 capsules by mouth daily.    Marland Kitchen lisinopril-hydrochlorothiazide (PRINZIDE,ZESTORETIC) 20-12.5 MG tablet TAKE 2 TABLETS BY MOUTH ONCE DAILY 60 tablet 0  . Omega-3 Fatty Acids (FISH OIL) 1200 MG CAPS Take by mouth daily.    . pravastatin (PRAVACHOL) 40 MG tablet Take 1 tablet (40 mg total) by mouth daily. 90 tablet 1   No facility-administered medications prior to visit.     No Known Allergies  Review of Systems  Constitutional: Negative for chills, fever and malaise/fatigue.  HENT: Negative for congestion and hearing loss.   Eyes: Negative for discharge.  Respiratory: Negative for cough, sputum production and shortness of breath.   Cardiovascular: Negative for chest pain,  palpitations and leg swelling.  Gastrointestinal: Negative for abdominal pain, blood in stool, constipation, diarrhea, heartburn, nausea and vomiting.  Genitourinary: Negative for dysuria, frequency, hematuria and urgency.  Musculoskeletal: Negative for back pain, falls and myalgias.  Skin: Negative for rash.  Neurological: Negative for dizziness, sensory change, loss of consciousness, weakness and headaches.  Endo/Heme/Allergies: Negative for environmental allergies. Does not bruise/bleed easily.  Psychiatric/Behavioral: Negative for depression and suicidal ideas. The patient is not nervous/anxious and does not have insomnia.        Objective:    Physical Exam  Constitutional: He is oriented to person, place, and time. Vital signs are normal. He appears well-developed and well-nourished. He is sleeping.  HENT:  Head: Normocephalic and atraumatic.  Mouth/Throat:  Oropharynx is clear and moist.  Eyes: EOM are normal. Pupils are equal, round, and reactive to light.  Neck: Normal range of motion. Neck supple. No thyromegaly present.  Cardiovascular: Normal rate and regular rhythm.  No murmur heard. Pulmonary/Chest: Effort normal and breath sounds normal. No respiratory distress. He has no wheezes. He has no rales. He exhibits no tenderness.  Musculoskeletal: He exhibits no edema or tenderness.  Neurological: He is alert and oriented to person, place, and time.  Skin: Skin is warm and dry.  Psychiatric: He has a normal mood and affect. His behavior is normal. Judgment and thought content normal.  Nursing note and vitals reviewed.   BP 122/80 (BP Location: Left Arm, Patient Position: Sitting, Cuff Size: Normal)   Pulse 73   Temp 97.9 F (36.6 C) (Oral)   Resp 16   Ht 5' 6.93" (1.7 m)   Wt 152 lb 3.2 oz (69 kg)   SpO2 97%   BMI 23.89 kg/m  Wt Readings from Last 3 Encounters:  02/17/18 152 lb 3.2 oz (69 kg)  10/07/17 151 lb (68.5 kg)  09/18/17 151 lb 9.6 oz (68.8 kg)   BP Readings from Last 3 Encounters:  02/17/18 122/80  10/07/17 121/78  09/18/17 124/76     Immunization History  Administered Date(s) Administered  . Tdap 12/11/2011    Health Maintenance  Topic Date Due  . INFLUENZA VACCINE  10/10/2054 (Originally 07/10/2017)  . TETANUS/TDAP  05/21/2022  . COLONOSCOPY  10/07/2022  . Hepatitis C Screening  Completed  . HIV Screening  Completed    Lab Results  Component Value Date   WBC 6.4 03/19/2017   HGB 16.2 03/19/2017   HCT 47.4 03/19/2017   PLT 178 03/19/2017   GLUCOSE 79 07/04/2017   CHOL 141 07/04/2017   TRIG 54.0 07/04/2017   HDL 46.90 07/04/2017   LDLCALC 83 07/04/2017   ALT 15 07/04/2017   AST 18 07/04/2017   NA 141 07/04/2017   K 3.5 07/04/2017   CL 104 07/04/2017   CREATININE 0.90 07/04/2017   BUN 9 07/04/2017   CO2 30 07/04/2017   TSH 1.22 01/18/2014   PSA 1.03 01/18/2014   HGBA1C 5.4 03/19/2017    Lab  Results  Component Value Date   TSH 1.22 01/18/2014   Lab Results  Component Value Date   WBC 6.4 03/19/2017   HGB 16.2 03/19/2017   HCT 47.4 03/19/2017   MCV 93.3 03/19/2017   PLT 178 03/19/2017   Lab Results  Component Value Date   NA 141 07/04/2017   K 3.5 07/04/2017   CO2 30 07/04/2017   GLUCOSE 79 07/04/2017   BUN 9 07/04/2017   CREATININE 0.90 07/04/2017   BILITOT 1.1 07/04/2017   ALKPHOS 53  07/04/2017   AST 18 07/04/2017   ALT 15 07/04/2017   PROT 6.6 07/04/2017   ALBUMIN 3.9 07/04/2017   CALCIUM 9.5 07/04/2017   ANIONGAP 8 03/19/2017   GFR 90.83 07/04/2017   Lab Results  Component Value Date   CHOL 141 07/04/2017   Lab Results  Component Value Date   HDL 46.90 07/04/2017   Lab Results  Component Value Date   LDLCALC 83 07/04/2017   Lab Results  Component Value Date   TRIG 54.0 07/04/2017   Lab Results  Component Value Date   CHOLHDL 3 07/04/2017   Lab Results  Component Value Date   HGBA1C 5.4 03/19/2017         Assessment & Plan:   Problem List Items Addressed This Visit      Unprioritized   HTN (hypertension)    Well controlled, no changes to meds. Encouraged heart healthy diet such as the DASH diet and exercise as tolerated.       Hyperlipidemia    Tolerating statin, encouraged heart healthy diet, avoid trans fats, minimize simple carbs and saturated fats. Increase exercise as tolerated         I am having Marily Lente. Garber maintain his Flaxseed Oil, Fish Oil, aspirin EC, pravastatin, and lisinopril-hydrochlorothiazide.  No orders of the defined types were placed in this encounter.   CMA served as Education administrator during this visit. History, Physical and Plan performed by medical provider. Documentation and orders reviewed and attested to.  [name], DO

## 2018-02-17 NOTE — Patient Instructions (Signed)

## 2018-02-17 NOTE — Assessment & Plan Note (Signed)
Well controlled, no changes to meds. Encouraged heart healthy diet such as the DASH diet and exercise as tolerated.  °

## 2018-02-17 NOTE — Assessment & Plan Note (Signed)
Tolerating statin, encouraged heart healthy diet, avoid trans fats, minimize simple carbs and saturated fats. Increase exercise as tolerated 

## 2018-02-18 LAB — COMPREHENSIVE METABOLIC PANEL
ALBUMIN: 4 g/dL (ref 3.5–5.2)
ALK PHOS: 55 U/L (ref 39–117)
ALT: 13 U/L (ref 0–53)
AST: 17 U/L (ref 0–37)
BUN: 7 mg/dL (ref 6–23)
CALCIUM: 9.6 mg/dL (ref 8.4–10.5)
CHLORIDE: 101 meq/L (ref 96–112)
CO2: 34 mEq/L — ABNORMAL HIGH (ref 19–32)
Creatinine, Ser: 0.92 mg/dL (ref 0.40–1.50)
GFR: 88.38 mL/min (ref >60.00)
Glucose, Bld: 85 mg/dL (ref 70–99)
POTASSIUM: 4 meq/L (ref 3.5–5.1)
Sodium: 140 mEq/L (ref 135–145)
TOTAL PROTEIN: 6.7 g/dL (ref 6.0–8.3)
Total Bilirubin: 0.7 mg/dL (ref 0.2–1.2)

## 2018-02-18 LAB — LIPID PANEL
Cholesterol: 135 mg/dL (ref 0–200)
HDL: 51.1 mg/dL (ref >39.00)
LDL Cholesterol: 69 mg/dL (ref 0–99)
NonHDL: 84.1
TRIGLYCERIDES: 77 mg/dL (ref 0.0–149.0)
Total CHOL/HDL Ratio: 3
VLDL: 15.4 mg/dL (ref 0.0–40.0)

## 2018-02-21 ENCOUNTER — Other Ambulatory Visit: Payer: Self-pay | Admitting: Family Medicine

## 2018-02-21 DIAGNOSIS — I10 Essential (primary) hypertension: Secondary | ICD-10-CM

## 2018-03-12 NOTE — Progress Notes (Signed)
[facility]  PATIENT: DERRELL MILANES DOB: [birth year]   REASON FOR VISIT:  follow-up for stroke April 2018 HISTORY FROM: Patient and wife    HISTORY OF PRESENT ILLNESS:(From record)Verlyn J Brownis an 62 y.o.malewith history of HTN, HLD presents to the ER with confusion and memory loss that lasted 2 hours. Pt recalls having a normal morning including waking up, taking his BP/cholesterol meds, going for a walk, ate breakfast and went to work. Following that, he cannot recall anything for about 2 hours. His boss called his wife saying that he wasn't acting right, was repeating things and seemed confused. He then drove him home. At this time patient felt back to normal but could not recall anything that happened in the 2 hours he was "at work." In the ED patient still does not recall the 2 hours that occurred. He feels that he is back to his baseline. Patient did obtain an MRI of his brain that showed a punctate, 3 mm focus of acute ischemia within the right temporal lobe and hippocampus/hippocampal gyrus. Ischemia in this area can be seen in the setting of acute memory loss. Patient was last known well 03/17/2017 at 21:30. Patient was not administered IV t-PA secondary to resolution of symptoms. He was admitted for further evaluation and treatment.EEG normal. MRA no large vessel occlusion. Right ICA atheroma with mild narrowing. Carotid Doppler no significant stenosis. 2-D echo with 55-60% EF. LDL 106. Hemoglobin A1c 5.4. No anti-thrombotic prior to admission. He returns to the stroke clinic for follow-up he remains on aspirin for secondary stroke prevention without further stroke or TIA symptoms. He has minimal bruising and no bleeding. Blood pressure in the office today 133/85. He is on pravastatin for hyperlipidemia without complaints of myalgias. He continues to exercise daily by walking. He returns for reevaluation UPDATE 4/4/2019CM Mr. Meisinger 62 year old male returns for  follow-up with history of stroke event in April 2018.  He is currently on aspirin for secondary stroke prevention without recurrent stroke or TIA symptoms.  He has minimal bruising and no bleeding.  Blood pressure in the office today 125/78.  He is on Pravachol without myalgias.  He is back to work full-time.  He walks every day at least a mile.  Does not smoke or drink.  He returns for reevaluation REVIEW OF SYSTEMS: Full 14 system review of systems performed and notable only for those listed, all others are neg:  Constitutional: neg  Cardiovascular: neg Ear/Nose/Throat: neg  Skin: neg Eyes: neg Respiratory: neg Gastroitestinal: neg  Hematology/Lymphatic: neg  Endocrine: neg Musculoskeletal:neg Allergy/Immunology: neg Neurological: neg Psychiatric: neg Sleep : neg   ALLERGIES: No Known Allergies  HOME MEDICATIONS: Outpatient Medications Prior to Visit  Medication Sig Dispense Refill  . aspirin EC 325 MG tablet Take 1 tablet (325 mg total) by mouth daily. 30 tablet 0  . Flaxseed, Linseed, (FLAXSEED OIL) 1200 MG CAPS Take 2 capsules by mouth daily.    Marland Kitchen lisinopril-hydrochlorothiazide (PRINZIDE,ZESTORETIC) 20-12.5 MG tablet Take 2 tablets by mouth daily. 90 tablet 1  . Omega-3 Fatty Acids (FISH OIL) 1200 MG CAPS Take by mouth daily.    . pravastatin (PRAVACHOL) 40 MG tablet Take 1 tablet (40 mg total) by mouth daily. 90 tablet 1   No facility-administered medications prior to visit.     PAST MEDICAL HISTORY: Past Medical History:  Diagnosis Date  . CVA (cerebral vascular accident) ([place])   . High cholesterol   . Hypertension   . Memory loss   .  Tubular adenoma of colon     PAST SURGICAL HISTORY: Past Surgical History:  Procedure Laterality Date  . COLONOSCOPY    . HERNIA REPAIR Left 2004  . POLYPECTOMY      FAMILY HISTORY: Family History  Problem Relation Age of Onset  . Heart disease Mother        Duanne Limerick  . Berenice Primas' disease Mother   . Heart disease Father   .  Hyperlipidemia Brother   . Hypertension Brother   . Heart disease Paternal Uncle   . Heart disease Paternal Uncle   . Heart disease Paternal Uncle   . Heart disease Paternal Uncle   . Heart disease Paternal Uncle   . Heart disease Paternal Uncle   . Hypertension Brother   . Breast cancer Sister   . Colon cancer Neg Hx     SOCIAL HISTORY: Social History   Socioeconomic History  . Marital status: Married    Spouse name: Not on file  . Number of children: Not on file  . Years of education: Not on file  . Highest education level: Not on file  Occupational History  . Not on file  Social Needs  . Financial resource strain: Not on file  . Food insecurity:    Worry: Not on file    Inability: Not on file  . Transportation needs:    Medical: Not on file    Non-medical: Not on file  Tobacco Use  . Smoking status: Former Smoker    Packs/day: 1.00    Years: 15.00    Pack years: 15.00    Types: Cigarettes    Last attempt to quit: 01/19/1988    Years since quitting: 30.1  . Smokeless tobacco: Current User    Types: Chew  Substance and Sexual Activity  . Alcohol use: No  . Drug use: No  . Sexual activity: Yes  Lifestyle  . Physical activity:    Days per week: Not on file    Minutes per session: Not on file  . Stress: Not on file  Relationships  . Social connections:    Talks on phone: Not on file    Gets together: Not on file    Attends religious service: Not on file    Active member of club or organization: Not on file    Attends meetings of clubs or organizations: Not on file    Relationship status: Not on file  . Intimate partner violence:    Fear of current or ex partner: Not on file    Emotionally abused: Not on file    Physically abused: Not on file    Forced sexual activity: Not on file  Other Topics Concern  . Not on file  Social History Narrative   Exercise-walking     PHYSICAL EXAM  Vitals:   03/13/18 1432  BP: 125/78  Pulse: 83  Weight: 153 lb  (69.4 kg)  Height: 5\' 7"  (1.702 m)   Body mass index is 23.96 kg/m.  Generalized: Well developed, in no acute distress  Head: normocephalic and atraumatic,. Oropharynx benign  Neck: Supple, no carotid bruits  Cardiac: Regular rate rhythm, no murmur  Musculoskeletal: No deformity   Neurological examination   Mentation: Alert oriented to time, place, history taking. Attention span and concentration appropriate. Recent and remote memory intact.  Follows all commands speech and language fluent.   Cranial nerve II-XII: Pupils were equal round reactive to light extraocular movements were full, visual field were full on confrontational test.  Facial sensation and strength were normal. hearing was intact to finger rubbing bilaterally. Uvula tongue midline. head turning and shoulder shrug were normal and symmetric.Tongue protrusion into cheek strength was normal. Motor: normal bulk and tone, full strength in the BUE, BLE, fine finger movements normal, no pronator drift.  Sensory: normal and symmetric to light touch, pinprick, and  Vibration, in the upper and lower extremities  Coordination: finger-nose-finger, heel-to-shin bilaterally, no dysmetria, no tremor Reflexes: 1+ upper lower and symmetric plantar responses were flexor bilaterally. Gait and Station: Rising up from seated position without assistance, normal stance,  moderate stride, good arm swing, smooth turning, able to perform tiptoe, and heel walking without difficulty. Tandem gait is steady  DIAGNOSTIC DATA (LABS, IMAGING, TESTING) - I reviewed patient records, labs, notes, testing and imaging myself where available.  Lab Results  Component Value Date   WBC 6.4 03/19/2017   HGB 16.2 03/19/2017   HCT 47.4 03/19/2017   MCV 93.3 03/19/2017   PLT 178 03/19/2017      Component Value Date/Time   NA 140 02/17/2018 1609   K 4.0 02/17/2018 1609   CL 101 02/17/2018 1609   CO2 34 (H) 02/17/2018 1609   GLUCOSE 85 02/17/2018 1609   BUN  7 02/17/2018 1609   CREATININE 0.92 02/17/2018 1609   CALCIUM 9.6 02/17/2018 1609   PROT 6.7 02/17/2018 1609   ALBUMIN 4.0 02/17/2018 1609   AST 17 02/17/2018 1609   ALT 13 02/17/2018 1609   ALKPHOS 55 02/17/2018 1609   BILITOT 0.7 02/17/2018 1609   GFRNONAA >60 03/19/2017 0723   GFRAA >60 03/19/2017 0723   Lab Results  Component Value Date   CHOL 135 02/17/2018   HDL 51.10 02/17/2018   LDLCALC 69 02/17/2018   TRIG 77.0 02/17/2018   CHOLHDL 3 02/17/2018   Lab Results  Component Value Date   HGBA1C 5.4 03/19/2017    ASSESSMENT AND PLAN  62 y.o. year old male  has a past medical history of CVA (cerebral vascular accident) ([place]), High cholesterol, Hypertension, Memory loss, and Tubular adenoma of colon. here For  follow-up for stroke April 2018  PLAN: Stressed the importance of management of risk factors to prevent further stroke Continue Aspirin for secondary stroke prevention Maintain strict control of hypertension with blood pressure goal below 130/90, today's reading 125/78 continue antihypertensive medications Control of diabetes with hemoglobin A1c below 6.5 followed by primary care Cholesterol with LDL cholesterol less than 70, followed by primary care,  continue statin drug Pravachol Exercise by walking, , recommend 30 minutes a day  eat healthy diet with whole grains,  fresh fruits and vegetables Discharge from stroke clinic I spent 25 minutes in total face to face time with the patient more than 50% of which was spent counseling and coordination of care, reviewing test results reviewing medications and discussing and reviewing the diagnosis of stroke and management of risk factors.  He was also given written information for later review.  All questions were answered Dennie Bible, [facility], [facility], APRN  [facility] [address], [place], [place] [address] [phone]

## 2018-03-13 ENCOUNTER — Encounter: Payer: Self-pay | Admitting: Nurse Practitioner

## 2018-03-13 ENCOUNTER — Ambulatory Visit (INDEPENDENT_AMBULATORY_CARE_PROVIDER_SITE_OTHER): Payer: 59 | Admitting: Nurse Practitioner

## 2018-03-13 VITALS — BP 125/78 | HR 83 | Ht 67.0 in | Wt 153.0 lb

## 2018-03-13 DIAGNOSIS — I639 Cerebral infarction, unspecified: Secondary | ICD-10-CM

## 2018-03-13 DIAGNOSIS — E785 Hyperlipidemia, unspecified: Secondary | ICD-10-CM

## 2018-03-13 DIAGNOSIS — I10 Essential (primary) hypertension: Secondary | ICD-10-CM | POA: Diagnosis not present

## 2018-03-13 NOTE — Patient Instructions (Addendum)
Stressed the importance of management of risk factors to prevent further stroke Continue Aspirin for secondary stroke prevention Maintain strict control of hypertension with blood pressure goal below 130/90, today's reading 125/78 continue antihypertensive medications Control of diabetes with hemoglobin A1c below 6.5 followed by primary care Cholesterol with LDL cholesterol less than 70, followed by primary care,  continue statin drug Pravachol Exercise by walking, , recommend 30 minutes a day  eat healthy diet with whole grains,  fresh fruits and vegetables Discharge from stroke clinic  Stroke Prevention Some medical conditions and behaviors are associated with a higher chance of having a stroke. You can help prevent a stroke by making nutrition, lifestyle, and other changes, including managing any medical conditions you may have. What nutrition changes can be made?  Eat healthy foods. You can do this by: ? Choosing foods high in fiber, such as fresh fruits and vegetables and whole grains. ? Eating at least 5 or more servings of fruits and vegetables a day. Try to fill half of your plate at each meal with fruits and vegetables. ? Choosing lean protein foods, such as lean cuts of meat, poultry without skin, fish, tofu, beans, and nuts. ? Eating low-fat dairy products. ? Avoiding foods that are high in salt (sodium). This can help lower blood pressure. ? Avoiding foods that have saturated fat, trans fat, and cholesterol. This can help prevent high cholesterol. ? Avoiding processed and premade foods.  Follow your health care provider's specific guidelines for losing weight, controlling high blood pressure (hypertension), lowering high cholesterol, and managing diabetes. These may include: ? Reducing your daily calorie intake. ? Limiting your daily sodium intake to 1,500 milligrams (mg). ? Using only healthy fats for cooking, such as olive oil, canola oil, or sunflower oil. ? Counting your  daily carbohydrate intake. What lifestyle changes can be made?  Maintain a healthy weight. Talk to your health care provider about your ideal weight.  Get at least 30 minutes of moderate physical activity at least 5 days a week. Moderate activity includes brisk walking, biking, and swimming.  Do not use any products that contain nicotine or tobacco, such as cigarettes and e-cigarettes. If you need help quitting, ask your health care provider. It may also be helpful to avoid exposure to secondhand smoke.  Limit alcohol intake to no more than 1 drink a day for nonpregnant women and 2 drinks a day for men. One drink equals 12 oz of beer, 5 oz of wine, or 1 oz of hard liquor.  Stop any illegal drug use.  Avoid taking birth control pills. Talk to your health care provider about the risks of taking birth control pills if: ? You are over 32 years old. ? You smoke. ? You get migraines. ? You have ever had a blood clot. What other changes can be made?  Manage your cholesterol levels. ? Eating a healthy diet is important for preventing high cholesterol. If cholesterol cannot be managed through diet alone, you may also need to take medicines. ? Take any prescribed medicines to control your cholesterol as told by your health care provider.  Manage your diabetes. ? Eating a healthy diet and exercising regularly are important parts of managing your blood sugar. If your blood sugar cannot be managed through diet and exercise, you may need to take medicines. ? Take any prescribed medicines to control your diabetes as told by your health care provider.  Control your hypertension. ? To reduce your risk of stroke, try to  keep your blood pressure below 130/80. ? Eating a healthy diet and exercising regularly are an important part of controlling your blood pressure. If your blood pressure cannot be managed through diet and exercise, you may need to take medicines. ? Take any prescribed medicines to  control hypertension as told by your health care provider. ? Ask your health care provider if you should monitor your blood pressure at home. ? Have your blood pressure checked every year, even if your blood pressure is normal. Blood pressure increases with age and some medical conditions.  Get evaluated for sleep disorders (sleep apnea). Talk to your health care provider about getting a sleep evaluation if you snore a lot or have excessive sleepiness.  Take over-the-counter and prescription medicines only as told by your health care provider. Aspirin or blood thinners (antiplatelets or anticoagulants) may be recommended to reduce your risk of forming blood clots that can lead to stroke.  Make sure that any other medical conditions you have, such as atrial fibrillation or atherosclerosis, are managed. What are the warning signs of a stroke? The warning signs of a stroke can be easily remembered as BEFAST.  B is for balance. Signs include: ? Dizziness. ? Loss of balance or coordination. ? Sudden trouble walking.  E is for eyes. Signs include: ? A sudden change in vision. ? Trouble seeing.  F is for face. Signs include: ? Sudden weakness or numbness of the face. ? The face or eyelid drooping to one side.  A is for arms. Signs include: ? Sudden weakness or numbness of the arm, usually on one side of the body.  S is for speech. Signs include: ? Trouble speaking (aphasia). ? Trouble understanding.  T is for time. ? These symptoms may represent a serious problem that is an emergency. Do not wait to see if the symptoms will go away. Get medical help right away. Call your local emergency services (911 in the [place] Do not drive yourself to the hospital.  Other signs of stroke may include: ? A sudden, severe headache with no known cause. ? Nausea or vomiting. ? Seizure.  Where to find more information: For more information, visit:  American Stroke Association:  www.strokeassociation.org  National Stroke Association: www.stroke.org  Summary  You can prevent a stroke by eating healthy, exercising, not smoking, limiting alcohol intake, and managing any medical conditions you may have.  Do not use any products that contain nicotine or tobacco, such as cigarettes and e-cigarettes. If you need help quitting, ask your health care provider. It may also be helpful to avoid exposure to secondhand smoke.  Remember BEFAST for warning signs of stroke. Get help right away if you or a loved one has any of these signs. This information is not intended to replace advice given to you by your health care provider. Make sure you discuss any questions you have with your health care provider. Document Released: 01/03/2005 Document Revised: 01/01/2017 Document Reviewed: 01/01/2017 Elsevier Interactive Patient Education  [employer].

## 2018-03-13 NOTE — Progress Notes (Signed)
I reviewed above note and agree with the assessment and plan.    Drew Hawking, MD PhD Stroke Neurology 03/13/2018 5:59 PM

## 2018-05-21 ENCOUNTER — Other Ambulatory Visit: Payer: Self-pay | Admitting: Family Medicine

## 2018-05-21 DIAGNOSIS — I10 Essential (primary) hypertension: Secondary | ICD-10-CM

## 2018-08-18 ENCOUNTER — Other Ambulatory Visit: Payer: Self-pay | Admitting: Family Medicine

## 2018-08-18 DIAGNOSIS — I10 Essential (primary) hypertension: Secondary | ICD-10-CM

## 2018-11-18 ENCOUNTER — Other Ambulatory Visit: Payer: Self-pay | Admitting: Family Medicine

## 2018-11-18 DIAGNOSIS — I10 Essential (primary) hypertension: Secondary | ICD-10-CM

## 2018-12-25 ENCOUNTER — Encounter: Payer: Self-pay | Admitting: Family Medicine

## 2018-12-25 ENCOUNTER — Ambulatory Visit (INDEPENDENT_AMBULATORY_CARE_PROVIDER_SITE_OTHER): Payer: 59 | Admitting: Family Medicine

## 2018-12-25 VITALS — BP 122/78 | HR 67 | Temp 98.3°F | Ht 67.0 in | Wt 151.4 lb

## 2018-12-25 DIAGNOSIS — E785 Hyperlipidemia, unspecified: Secondary | ICD-10-CM | POA: Diagnosis not present

## 2018-12-25 DIAGNOSIS — I10 Essential (primary) hypertension: Secondary | ICD-10-CM

## 2018-12-25 MED ORDER — PRAVASTATIN SODIUM 40 MG PO TABS: 40.0000 mg | ORAL_TABLET | Freq: Every day | ORAL | 1 refills | Status: DC

## 2018-12-25 MED ORDER — LISINOPRIL-HYDROCHLOROTHIAZIDE 20-12.5 MG PO TABS: 2.0000 | ORAL_TABLET | Freq: Every day | ORAL | 1 refills | Status: DC

## 2018-12-25 NOTE — Patient Instructions (Signed)

## 2018-12-25 NOTE — Progress Notes (Signed)
Pre visit review using our clinic review tool, if applicable. No additional management support is needed unless otherwise documented below in the visit note. 

## 2018-12-25 NOTE — Progress Notes (Signed)
Patient ID: Drew [name], male    DOB: [birth year]  Age: 63 y.o. MRN: [id number]    Subjective:  Subjective  HPI Drew [name] presents for f/u bp and cholesterol   No complaints   Review of Systems  Constitutional: Negative for appetite change, diaphoresis, fatigue and unexpected weight change.  Eyes: Negative for pain, redness and visual disturbance.  Respiratory: Negative for cough, chest tightness, shortness of breath and wheezing.   Cardiovascular: Negative for chest pain, palpitations and leg swelling.  Endocrine: Negative for cold intolerance, heat intolerance, polydipsia, polyphagia and polyuria.  Genitourinary: Negative for difficulty urinating, dysuria and frequency.  Neurological: Negative for dizziness, light-headedness, numbness and headaches.    History Past Medical History:  Diagnosis Date  . CVA (cerebral vascular accident) ([place])   . High cholesterol   . Hypertension   . Memory loss   . Tubular adenoma of colon     He has a past surgical history that includes Hernia repair (Left, 2004); Colonoscopy; and Polypectomy.   His family history includes Breast cancer in his sister; Berenice Primas' disease in his mother; Heart disease in his father, mother, paternal uncle, paternal uncle, paternal uncle, paternal uncle, paternal uncle, and paternal uncle; Hyperlipidemia in his brother; Hypertension in his brother and brother.He reports that he quit smoking about 30 years ago. His smoking use included cigarettes. He has a 15.00 pack-year smoking history. His smokeless tobacco use includes chew. He reports that he does not drink alcohol or use drugs.  Current Outpatient Medications on File Prior to Visit  Medication Sig Dispense Refill  . aspirin EC 325 MG tablet Take 1 tablet (325 mg total) by mouth daily. 30 tablet 0  . Flaxseed, Linseed, (FLAXSEED OIL) 1200 MG CAPS Take 2 capsules by mouth daily.    . Omega-3 Fatty Acids (FISH OIL) 1200 MG CAPS Take by mouth daily.     No  current facility-administered medications on file prior to visit.      Objective:  Objective  Physical Exam Vitals signs and nursing note reviewed.  Constitutional:      General: He is sleeping.     Appearance: He is well-developed.  HENT:     Head: Normocephalic and atraumatic.  Eyes:     Pupils: Pupils are equal, round, and reactive to light.  Neck:     Musculoskeletal: Normal range of motion and neck supple.     Thyroid: No thyromegaly.  Cardiovascular:     Rate and Rhythm: Normal rate and regular rhythm.     Heart sounds: No murmur.  Pulmonary:     Effort: Pulmonary effort is normal. No respiratory distress.     Breath sounds: Normal breath sounds. No wheezing or rales.  Chest:     Chest wall: No tenderness.  Musculoskeletal:        General: No tenderness.  Skin:    General: Skin is warm and dry.  Neurological:     Mental Status: He is oriented to person, place, and time.  Psychiatric:        Behavior: Behavior normal.        Thought Content: Thought content normal.        Judgment: Judgment normal.    BP 122/78 (BP Location: Left Arm, Patient Position: Sitting, Cuff Size: Normal)   Pulse 67   Temp 98.3 F (36.8 C) (Oral)   Ht 5\' 7"  (1.702 m)   Wt 151 lb 6 oz (68.7 kg)   SpO2 97%   BMI 23.71  kg/m  Wt Readings from Last 3 Encounters:  12/25/18 151 lb 6 oz (68.7 kg)  03/13/18 153 lb (69.4 kg)  02/17/18 152 lb 3.2 oz (69 kg)     Lab Results  Component Value Date   WBC 6.4 03/19/2017   HGB 16.2 03/19/2017   HCT 47.4 03/19/2017   PLT 178 03/19/2017   GLUCOSE 72 12/25/2018   CHOL 135 12/25/2018   TRIG 108.0 12/25/2018   HDL 40.90 12/25/2018   LDLCALC 72 12/25/2018   ALT 10 12/25/2018   AST 14 12/25/2018   NA 139 12/25/2018   K 4.6 12/25/2018   CL 101 12/25/2018   CREATININE 0.95 12/25/2018   BUN 10 12/25/2018   CO2 31 12/25/2018   TSH 1.22 01/18/2014   PSA 1.03 01/18/2014   HGBA1C 5.4 03/19/2017    Ct Head Wo Contrast  Result Date:  03/18/2017 CLINICAL DATA:  Confusion, memory issues starting this morning EXAM: CT HEAD WITHOUT CONTRAST TECHNIQUE: Contiguous axial images were obtained from the base of the skull through the vertex without intravenous contrast. COMPARISON:  12/04/2016 FINDINGS: Brain: No intracranial hemorrhage, mass effect or midline shift. No definite acute cortical infarction. Ventricular size is stable from prior exam. No mass lesion is noted on this unenhanced scan. Vascular: Mild atherosclerotic calcifications of carotid siphon Skull: No skull fracture is noted. Sinuses/Orbits: Mucosal thickening with partial opacification bilateral ethmoid air cells. Mucosal thickening bilateral sphenoid sinuses. Mild mucosal thickening maxillary sinuses. Other: None IMPRESSION: No acute intracranial abnormality. No definite acute cortical infarction. Paranasal sinuses disease as described above. Electronically Signed   By: Lahoma Crocker M.D.   On: 03/18/2017 09:47   Mr Jodene Nam Head Wo Contrast  Result Date: 03/19/2017 CLINICAL DATA:  Initial evaluation for acute stroke. Found to have punctate acute ischemic infarct within the medial right temporal lobe. EXAM: MRA HEAD WITHOUT CONTRAST TECHNIQUE: Angiographic images of the Circle of Willis were obtained using MRA technique without intravenous contrast. COMPARISON:  Prior MRI from 03/18/2017. FINDINGS: ANTERIOR CIRCULATION: Distal cervical segments of the internal carotid arteries are patent with antegrade flow. Petrous segments widely patent bilaterally. Scattered atheromatous irregularity throughout the cavernous right ICA with mild to moderate multifocal narrowing. No significant atheromatous stenosis within the cavernous left ICA. ICA termini widely patent. Left A1 segment widely patent. Right A1 segment hypoplastic and/ or absent, which likely accounts for the slightly diminutive right ICA is compared to the left. Anterior communicating artery normal. Anterior cerebral arteries patent to  their distal aspects. Subtle attenuation of the distal ACA is felt to be artifactual due to motion. M1 segments widely patent without stenosis or occlusion. MCA bifurcations normal. No proximal M2 occlusion. Distal MCA branches well opacified and symmetric. POSTERIOR CIRCULATION: Left vertebral artery dominant and patent to the vertebrobasilar junction without stenosis. Right vertebral artery diminutive and largely terminates in PICA posterior inferior cerebral arteries themselves are patent proximally. Basilar artery somewhat diminutive but patent to its distal aspect. Superior cerebral arteries patent bilaterally. Right PCA supplied via a hypoplastic P1 segment arising from the basilar as well as a mildly prominent right posterior communicating artery. There is a fetal type left PCA supplied via a widely patent left posterior communicating artery. PCAs are patent to their distal aspects. No aneurysm or vascular malformation. IMPRESSION: 1. Negative intracranial MRA for large vessel occlusion. No high-grade or correctable stenosis. 2. Atheromatous irregularity within the cavernous right ICA with associated mild to moderate multifocal narrowing. 3. Hypoplastic/absent right A1 segment, with overall slightly diminutive right  internal carotid artery system. Anterior cerebral artery supplied via the left carotid artery system. 4. Predominant fetal type PCAs, with overall diminutive vertebrobasilar system. Right vertebral artery largely terminates in PICA. Electronically Signed   By: Jeannine Boga M.D.   On: 03/19/2017 19:05   Mr Brain Wo Contrast  Result Date: 03/19/2017 CLINICAL DATA:  TIA.  Confusion and memory loss. EXAM: MRI HEAD WITHOUT CONTRAST TECHNIQUE: Multiplanar, multiecho pulse sequences of the brain and surrounding structures were obtained without intravenous contrast. COMPARISON:  Head CT 03/18/2017 FINDINGS: Brain: There is a 3 mm focus of diffusion restriction within the medial right temporal  lobe at the hippocampus/para hippocampal gyrus. There is mild periventricular white matter hyperintensity. No other focal parenchymal signal abnormality. No mass lesion or midline shift. No hydrocephalus or extra-axial fluid collection. The midline structures are normal. No age advanced or lobar predominant atrophy. Vascular: Major intracranial arterial and venous sinus flow voids are preserved. No evidence of chronic microhemorrhage or amyloid angiopathy. Skull and upper cervical spine: The visualized skull base, calvarium, upper cervical spine and extracranial soft tissues are normal. Sinuses/Orbits: Diffuse mild mucosal thickening of the paranasal sinuses. Small amount of right mastoid fluid. Normal orbits. IMPRESSION: 1. Punctate, 3 mm focus of acute ischemia within the medial right temporal lobe, near the hippocampus/para hippocampal gyrus. Ischemia in this area can be seen in the setting of acute memory loss. 2. No hemorrhage or mass effect. 3. Minimal findings of chronic microvascular disease for age. Electronically Signed   By: Ulyses Jarred M.D.   On: 03/19/2017 00:07     Assessment & Plan:  Plan  I am having Drew [name] maintain his Flaxseed Oil, Fish Oil, aspirin EC, pravastatin, and lisinopril-hydrochlorothiazide.  Meds ordered this encounter  Medications  . pravastatin (PRAVACHOL) 40 MG tablet    Sig: Take 1 tablet (40 mg total) by mouth daily. Needs ov    Dispense:  90 tablet    Refill:  1  . lisinopril-hydrochlorothiazide (PRINZIDE,ZESTORETIC) 20-12.5 MG tablet    Sig: Take 2 tablets by mouth daily. Needs ov    Dispense:  180 tablet    Refill:  1    Problem List Items Addressed This Visit      Unprioritized   HTN (hypertension)    Well controlled, no changes to meds. Encouraged heart healthy diet such as the DASH diet and exercise as tolerated.       Relevant Medications   pravastatin (PRAVACHOL) 40 MG tablet   lisinopril-hydrochlorothiazide (PRINZIDE,ZESTORETIC)  20-12.5 MG tablet   Other Relevant Orders   Lipid panel (Completed)   Comprehensive metabolic panel (Completed)   Hyperlipidemia LDL goal <100 - Primary    Encouraged heart healthy diet, increase exercise, avoid trans fats, consider a krill oil cap daily      Relevant Medications   pravastatin (PRAVACHOL) 40 MG tablet   lisinopril-hydrochlorothiazide (PRINZIDE,ZESTORETIC) 20-12.5 MG tablet   Other Relevant Orders   Lipid panel (Completed)   Comprehensive metabolic panel (Completed)      Follow-up: Return in about 6 months (around 06/25/2019), or if symptoms worsen or fail to improve, for annual exam, fasting.  [name], DO

## 2018-12-26 LAB — COMPREHENSIVE METABOLIC PANEL
ALBUMIN: 4 g/dL (ref 3.5–5.2)
ALT: 10 U/L (ref 0–53)
AST: 14 U/L (ref 0–37)
Alkaline Phosphatase: 61 U/L (ref 39–117)
BUN: 10 mg/dL (ref 6–23)
CHLORIDE: 101 meq/L (ref 96–112)
CO2: 31 mEq/L (ref 19–32)
CREATININE: 0.95 mg/dL (ref 0.40–1.50)
Calcium: 9.7 mg/dL (ref 8.4–10.5)
GFR: 79.91 mL/min (ref >60.00)
Glucose, Bld: 72 mg/dL (ref 70–99)
Potassium: 4.6 mEq/L (ref 3.5–5.1)
SODIUM: 139 meq/L (ref 135–145)
Total Bilirubin: 0.7 mg/dL (ref 0.2–1.2)
Total Protein: 6.3 g/dL (ref 6.0–8.3)

## 2018-12-26 LAB — LIPID PANEL
CHOLESTEROL: 135 mg/dL (ref 0–200)
HDL: 40.9 mg/dL (ref >39.00)
LDL CALC: 72 mg/dL (ref 0–99)
NonHDL: 93.7
Total CHOL/HDL Ratio: 3
Triglycerides: 108 mg/dL (ref 0.0–149.0)
VLDL: 21.6 mg/dL (ref 0.0–40.0)

## 2018-12-28 NOTE — Assessment & Plan Note (Signed)
Well controlled, no changes to meds. Encouraged heart healthy diet such as the DASH diet and exercise as tolerated.  °

## 2018-12-28 NOTE — Assessment & Plan Note (Signed)
Encouraged heart healthy diet, increase exercise, avoid trans fats, consider a krill oil cap daily 

## 2019-07-15 ENCOUNTER — Other Ambulatory Visit: Payer: Self-pay | Admitting: Family Medicine

## 2019-07-15 DIAGNOSIS — I10 Essential (primary) hypertension: Secondary | ICD-10-CM

## 2019-07-15 DIAGNOSIS — E785 Hyperlipidemia, unspecified: Secondary | ICD-10-CM

## 2019-10-06 ENCOUNTER — Ambulatory Visit: Payer: 59 | Admitting: Family Medicine

## 2019-10-06 ENCOUNTER — Other Ambulatory Visit: Payer: Self-pay

## 2019-10-06 ENCOUNTER — Encounter: Payer: Self-pay | Admitting: Family Medicine

## 2019-10-06 DIAGNOSIS — E785 Hyperlipidemia, unspecified: Secondary | ICD-10-CM

## 2019-10-06 DIAGNOSIS — I10 Essential (primary) hypertension: Secondary | ICD-10-CM

## 2019-10-06 MED ORDER — LISINOPRIL-HYDROCHLOROTHIAZIDE 20-12.5 MG PO TABS: 2.0000 | ORAL_TABLET | Freq: Every day | ORAL | 1 refills | Status: DC

## 2019-10-06 MED ORDER — PRAVASTATIN SODIUM 40 MG PO TABS: 40.0000 mg | ORAL_TABLET | Freq: Every day | ORAL | 1 refills | Status: DC

## 2019-10-06 NOTE — Progress Notes (Signed)
Patient ID: Drew [name], male    DOB: 03/13/1956  Age: 63 y.o. MRN: [id number]    Subjective:  Subjective  HPI MICHELLE BOVA presents for f/u bp and cholesterol No complaints He does not want a flu shot or shingles vaccine   Review of Systems  Constitutional: Negative for appetite change, diaphoresis, fatigue and unexpected weight change.  Eyes: Negative for pain, redness and visual disturbance.  Respiratory: Negative for cough, chest tightness, shortness of breath and wheezing.   Cardiovascular: Negative for chest pain, palpitations and leg swelling.  Endocrine: Negative for cold intolerance, heat intolerance, polydipsia, polyphagia and polyuria.  Genitourinary: Negative for difficulty urinating, dysuria and frequency.  Neurological: Negative for dizziness, light-headedness, numbness and headaches.    History Past Medical History:  Diagnosis Date   CVA (cerebral vascular accident) ([place])    High cholesterol    Hypertension    Memory loss    Tubular adenoma of colon     He has a past surgical history that includes Hernia repair (Left, 2004); Colonoscopy; and Polypectomy.   His family history includes Breast cancer in his sister; Berenice Primas' disease in his mother; Heart disease in his father, mother, paternal uncle, paternal uncle, paternal uncle, paternal uncle, paternal uncle, and paternal uncle; Hyperlipidemia in his brother; Hypertension in his brother and brother.He reports that he quit smoking about 31 years ago. His smoking use included cigarettes. He has a 15.00 pack-year smoking history. His smokeless tobacco use includes chew. He reports that he does not drink alcohol or use drugs.  Current Outpatient Medications on File Prior to Visit  Medication Sig Dispense Refill   aspirin EC 325 MG tablet Take 1 tablet (325 mg total) by mouth daily. 30 tablet 0   Flaxseed, Linseed, (FLAXSEED OIL) 1200 MG CAPS Take 2 capsules by mouth daily.     Omega-3 Fatty Acids (FISH OIL)  1200 MG CAPS Take by mouth daily.     No current facility-administered medications on file prior to visit.      Objective:  Objective  Physical Exam Vitals signs and nursing note reviewed.  Constitutional:      General: He is sleeping.     Appearance: He is well-developed.  HENT:     Head: Normocephalic and atraumatic.  Eyes:     Pupils: Pupils are equal, round, and reactive to light.  Neck:     Musculoskeletal: Normal range of motion and neck supple.     Thyroid: No thyromegaly.  Cardiovascular:     Rate and Rhythm: Normal rate and regular rhythm.     Heart sounds: No murmur.  Pulmonary:     Effort: Pulmonary effort is normal. No respiratory distress.     Breath sounds: Normal breath sounds. No wheezing or rales.  Chest:     Chest wall: No tenderness.  Musculoskeletal:        General: No tenderness.  Skin:    General: Skin is warm and dry.  Neurological:     Mental Status: He is oriented to person, place, and time.  Psychiatric:        Behavior: Behavior normal.        Thought Content: Thought content normal.        Judgment: Judgment normal.    BP 122/76 (BP Location: Left Arm, Patient Position: Sitting, Cuff Size: Normal)    Pulse 76    Temp (!) 97.4 F (36.3 C) (Temporal)    Resp 18    Ht 5\' 7"  (1.702 m)  Wt 148 lb 6.4 oz (67.3 kg)    SpO2 97%    BMI 23.24 kg/m  Wt Readings from Last 3 Encounters:  10/06/19 148 lb 6.4 oz (67.3 kg)  12/25/18 151 lb 6 oz (68.7 kg)  03/13/18 153 lb (69.4 kg)     Lab Results  Component Value Date   WBC 6.4 03/19/2017   HGB 16.2 03/19/2017   HCT 47.4 03/19/2017   PLT 178 03/19/2017   GLUCOSE 72 12/25/2018   CHOL 135 12/25/2018   TRIG 108.0 12/25/2018   HDL 40.90 12/25/2018   LDLCALC 72 12/25/2018   ALT 10 12/25/2018   AST 14 12/25/2018   NA 139 12/25/2018   K 4.6 12/25/2018   CL 101 12/25/2018   CREATININE 0.95 12/25/2018   BUN 10 12/25/2018   CO2 31 12/25/2018   TSH 1.22 01/18/2014   PSA 1.03 01/18/2014   HGBA1C  5.4 03/19/2017    Ct Head Wo Contrast  Result Date: 03/18/2017 CLINICAL DATA:  Confusion, memory issues starting this morning EXAM: CT HEAD WITHOUT CONTRAST TECHNIQUE: Contiguous axial images were obtained from the base of the skull through the vertex without intravenous contrast. COMPARISON:  12/04/2016 FINDINGS: Brain: No intracranial hemorrhage, mass effect or midline shift. No definite acute cortical infarction. Ventricular size is stable from prior exam. No mass lesion is noted on this unenhanced scan. Vascular: Mild atherosclerotic calcifications of carotid siphon Skull: No skull fracture is noted. Sinuses/Orbits: Mucosal thickening with partial opacification bilateral ethmoid air cells. Mucosal thickening bilateral sphenoid sinuses. Mild mucosal thickening maxillary sinuses. Other: None IMPRESSION: No acute intracranial abnormality. No definite acute cortical infarction. Paranasal sinuses disease as described above. Electronically Signed   By: Lahoma Crocker M.D.   On: 03/18/2017 09:47   Mr Jodene Nam Head Wo Contrast  Result Date: 03/19/2017 CLINICAL DATA:  Initial evaluation for acute stroke. Found to have punctate acute ischemic infarct within the medial right temporal lobe. EXAM: MRA HEAD WITHOUT CONTRAST TECHNIQUE: Angiographic images of the Circle of Willis were obtained using MRA technique without intravenous contrast. COMPARISON:  Prior MRI from 03/18/2017. FINDINGS: ANTERIOR CIRCULATION: Distal cervical segments of the internal carotid arteries are patent with antegrade flow. Petrous segments widely patent bilaterally. Scattered atheromatous irregularity throughout the cavernous right ICA with mild to moderate multifocal narrowing. No significant atheromatous stenosis within the cavernous left ICA. ICA termini widely patent. Left A1 segment widely patent. Right A1 segment hypoplastic and/ or absent, which likely accounts for the slightly diminutive right ICA is compared to the left. Anterior  communicating artery normal. Anterior cerebral arteries patent to their distal aspects. Subtle attenuation of the distal ACA is felt to be artifactual due to motion. M1 segments widely patent without stenosis or occlusion. MCA bifurcations normal. No proximal M2 occlusion. Distal MCA branches well opacified and symmetric. POSTERIOR CIRCULATION: Left vertebral artery dominant and patent to the vertebrobasilar junction without stenosis. Right vertebral artery diminutive and largely terminates in PICA posterior inferior cerebral arteries themselves are patent proximally. Basilar artery somewhat diminutive but patent to its distal aspect. Superior cerebral arteries patent bilaterally. Right PCA supplied via a hypoplastic P1 segment arising from the basilar as well as a mildly prominent right posterior communicating artery. There is a fetal type left PCA supplied via a widely patent left posterior communicating artery. PCAs are patent to their distal aspects. No aneurysm or vascular malformation. IMPRESSION: 1. Negative intracranial MRA for large vessel occlusion. No high-grade or correctable stenosis. 2. Atheromatous irregularity within the cavernous right ICA  with associated mild to moderate multifocal narrowing. 3. Hypoplastic/absent right A1 segment, with overall slightly diminutive right internal carotid artery system. Anterior cerebral artery supplied via the left carotid artery system. 4. Predominant fetal type PCAs, with overall diminutive vertebrobasilar system. Right vertebral artery largely terminates in PICA. Electronically Signed   By: Jeannine Boga M.D.   On: 03/19/2017 19:05   Mr Brain Wo Contrast  Result Date: 03/19/2017 CLINICAL DATA:  TIA.  Confusion and memory loss. EXAM: MRI HEAD WITHOUT CONTRAST TECHNIQUE: Multiplanar, multiecho pulse sequences of the brain and surrounding structures were obtained without intravenous contrast. COMPARISON:  Head CT 03/18/2017 FINDINGS: Brain: There is a 3  mm focus of diffusion restriction within the medial right temporal lobe at the hippocampus/para hippocampal gyrus. There is mild periventricular white matter hyperintensity. No other focal parenchymal signal abnormality. No mass lesion or midline shift. No hydrocephalus or extra-axial fluid collection. The midline structures are normal. No age advanced or lobar predominant atrophy. Vascular: Major intracranial arterial and venous sinus flow voids are preserved. No evidence of chronic microhemorrhage or amyloid angiopathy. Skull and upper cervical spine: The visualized skull base, calvarium, upper cervical spine and extracranial soft tissues are normal. Sinuses/Orbits: Diffuse mild mucosal thickening of the paranasal sinuses. Small amount of right mastoid fluid. Normal orbits. IMPRESSION: 1. Punctate, 3 mm focus of acute ischemia within the medial right temporal lobe, near the hippocampus/para hippocampal gyrus. Ischemia in this area can be seen in the setting of acute memory loss. 2. No hemorrhage or mass effect. 3. Minimal findings of chronic microvascular disease for age. Electronically Signed   By: Ulyses Jarred M.D.   On: 03/19/2017 00:07     Assessment & Plan:  Plan  I am having Marily Lente. [place] maintain his Flaxseed Oil, Fish Oil, aspirin EC, lisinopril-hydrochlorothiazide, and pravastatin.  Meds ordered this encounter  Medications   lisinopril-hydrochlorothiazide (ZESTORETIC) 20-12.5 MG tablet    Sig: Take 2 tablets by mouth daily. Needs ov    Dispense:  180 tablet    Refill:  1   pravastatin (PRAVACHOL) 40 MG tablet    Sig: Take 1 tablet (40 mg total) by mouth daily. Needs ov    Dispense:  90 tablet    Refill:  1    Needs follow up    Problem List Items Addressed This Visit      Unprioritized   HTN (hypertension)   Relevant Medications   lisinopril-hydrochlorothiazide (ZESTORETIC) 20-12.5 MG tablet   pravastatin (PRAVACHOL) 40 MG tablet   Other Relevant Orders   Lipid panel    Comprehensive metabolic panel   Hyperlipidemia LDL goal <100   Relevant Medications   lisinopril-hydrochlorothiazide (ZESTORETIC) 20-12.5 MG tablet   pravastatin (PRAVACHOL) 40 MG tablet   Other Relevant Orders   Lipid panel   Comprehensive metabolic panel      Follow-up: Return in about 6 months (around 04/05/2020) for annual exam, fasting.  [name], DO

## 2019-10-06 NOTE — Patient Instructions (Signed)

## 2019-10-07 LAB — COMPREHENSIVE METABOLIC PANEL
ALT: 11 U/L (ref 0–53)
AST: 19 U/L (ref 0–37)
Albumin: 3.9 g/dL (ref 3.5–5.2)
Alkaline Phosphatase: 56 U/L (ref 39–117)
BUN: 8 mg/dL (ref 6–23)
CO2: 31 mEq/L (ref 19–32)
Calcium: 9.3 mg/dL (ref 8.4–10.5)
Chloride: 101 mEq/L (ref 96–112)
Creatinine, Ser: 0.86 mg/dL (ref 0.40–1.50)
GFR: 89.42 mL/min (ref >60.00)
Glucose, Bld: 91 mg/dL (ref 70–99)
Potassium: 3.8 mEq/L (ref 3.5–5.1)
Sodium: 139 mEq/L (ref 135–145)
Total Bilirubin: 0.6 mg/dL (ref 0.2–1.2)
Total Protein: 6.2 g/dL (ref 6.0–8.3)

## 2019-10-07 LAB — LIPID PANEL
Cholesterol: 148 mg/dL (ref 0–200)
HDL: 44.4 mg/dL (ref >39.00)
LDL Cholesterol: 87 mg/dL (ref 0–99)
NonHDL: 103.41
Total CHOL/HDL Ratio: 3
Triglycerides: 84 mg/dL (ref 0.0–149.0)
VLDL: 16.8 mg/dL (ref 0.0–40.0)

## 2020-04-05 ENCOUNTER — Encounter: Payer: 59 | Admitting: Family Medicine

## 2020-04-08 ENCOUNTER — Other Ambulatory Visit: Payer: Self-pay

## 2020-04-08 DIAGNOSIS — I10 Essential (primary) hypertension: Secondary | ICD-10-CM

## 2020-04-08 DIAGNOSIS — E785 Hyperlipidemia, unspecified: Secondary | ICD-10-CM

## 2020-04-08 MED ORDER — PRAVASTATIN SODIUM 40 MG PO TABS: 40.0000 mg | ORAL_TABLET | Freq: Every day | ORAL | 1 refills | Status: DC

## 2020-04-08 MED ORDER — LISINOPRIL-HYDROCHLOROTHIAZIDE 20-12.5 MG PO TABS: 2.0000 | ORAL_TABLET | Freq: Every day | ORAL | 1 refills | Status: DC

## 2020-04-21 ENCOUNTER — Encounter: Payer: Self-pay | Admitting: Family Medicine

## 2020-04-21 ENCOUNTER — Ambulatory Visit (INDEPENDENT_AMBULATORY_CARE_PROVIDER_SITE_OTHER): Payer: 59 | Admitting: Family Medicine

## 2020-04-21 ENCOUNTER — Other Ambulatory Visit: Payer: Self-pay

## 2020-04-21 VITALS — BP 110/70 | HR 79 | Temp 97.6°F | Resp 18 | Ht 67.0 in | Wt 145.8 lb

## 2020-04-21 DIAGNOSIS — E785 Hyperlipidemia, unspecified: Secondary | ICD-10-CM

## 2020-04-21 DIAGNOSIS — I639 Cerebral infarction, unspecified: Secondary | ICD-10-CM

## 2020-04-21 DIAGNOSIS — Z0000 Encounter for general adult medical examination without abnormal findings: Secondary | ICD-10-CM

## 2020-04-21 DIAGNOSIS — I10 Essential (primary) hypertension: Secondary | ICD-10-CM | POA: Diagnosis not present

## 2020-04-21 NOTE — Progress Notes (Signed)
Patient ID: Drew [name], male    DOB: 10-30-1956  Age: 64 y.o. MRN: [id number]    Subjective:  Subjective  HPI Drew [name] presents for cpe and f/u with labs.  No complaints   Review of Systems  Constitutional: Negative.  Negative for fever.  HENT: Negative for congestion, ear pain, hearing loss, nosebleeds, postnasal drip, rhinorrhea, sinus pressure, sneezing and tinnitus.   Eyes: Negative for photophobia, discharge, itching and visual disturbance.  Respiratory: Negative.  Negative for shortness of breath.   Cardiovascular: Negative.  Negative for chest pain, palpitations and leg swelling.  Gastrointestinal: Negative for abdominal distention, abdominal pain, anal bleeding, blood in stool, constipation and nausea.  Endocrine: Negative.   Genitourinary: Negative.  Negative for dysuria and frequency.  Musculoskeletal: Negative.   Skin: Negative.  Negative for rash.  Allergic/Immunologic: Negative.  Negative for environmental allergies.  Neurological: Negative for dizziness, weakness, light-headedness, numbness and headaches.  Psychiatric/Behavioral: Negative for agitation, confusion, decreased concentration, dysphoric mood, sleep disturbance and suicidal ideas. The patient is not nervous/anxious.     History Past Medical History:  Diagnosis Date  . CVA (cerebral vascular accident) ([place])   . High cholesterol   . Hypertension   . Memory loss   . Tubular adenoma of colon     He has a past surgical history that includes Hernia repair (Left, 2004); Colonoscopy; and Polypectomy.   His family history includes Breast cancer in his sister; Berenice Primas' disease in his mother; Heart disease in his father, mother, paternal uncle, paternal uncle, paternal uncle, paternal uncle, paternal uncle, and paternal uncle; Hyperlipidemia in his brother; Hypertension in his brother and brother.He reports that he quit smoking about 32 years ago. His smoking use included cigarettes. He has a 15.00 pack-year  smoking history. His smokeless tobacco use includes chew. He reports that he does not drink alcohol or use drugs.  Current Outpatient Medications on File Prior to Visit  Medication Sig Dispense Refill  . aspirin EC 325 MG tablet Take 1 tablet (325 mg total) by mouth daily. 30 tablet 0  . Flaxseed, Linseed, (FLAXSEED OIL) 1200 MG CAPS Take 2 capsules by mouth daily.    Marland Kitchen lisinopril-hydrochlorothiazide (ZESTORETIC) 20-12.5 MG tablet Take 2 tablets by mouth daily. Needs ov 180 tablet 1  . Omega-3 Fatty Acids (FISH OIL) 1200 MG CAPS Take by mouth daily.    . pravastatin (PRAVACHOL) 40 MG tablet Take 1 tablet (40 mg total) by mouth daily. Needs ov 90 tablet 1   No current facility-administered medications on file prior to visit.      Health Maintenance  Topic Date Due  . COVID-19 Vaccine (1) Never done  . INFLUENZA VACCINE  10/10/2054 (Originally 07/10/2020)  . TETANUS/TDAP  05/21/2022  . COLONOSCOPY  10/07/2022  . Hepatitis C Screening  Completed  . HIV Screening  Completed    Objective:  Objective  Physical Exam Vitals and nursing note reviewed.  Constitutional:      General: He is not in acute distress.    Appearance: He is well-developed. He is not diaphoretic.  HENT:     Head: Normocephalic and atraumatic.     Right Ear: External ear normal.     Left Ear: External ear normal.     Nose: Nose normal.     Mouth/Throat:     Pharynx: No oropharyngeal exudate.  Eyes:     General:        Right eye: No discharge.        Left eye:  No discharge.     Conjunctiva/sclera: Conjunctivae normal.     Pupils: Pupils are equal, round, and reactive to light.  Neck:     Thyroid: No thyromegaly.     Vascular: No JVD.  Cardiovascular:     Rate and Rhythm: Normal rate and regular rhythm.     Heart sounds: No murmur. No friction rub. No gallop.   Pulmonary:     Effort: Pulmonary effort is normal. No respiratory distress.     Breath sounds: Normal breath sounds. No wheezing or rales.  Chest:       Chest wall: No tenderness.  Abdominal:     General: Bowel sounds are normal. There is no distension.     Palpations: Abdomen is soft. There is no mass.     Tenderness: There is no abdominal tenderness. There is no guarding or rebound.  Genitourinary:    Penis: Normal.      Prostate: Normal.     Rectum: Normal. Guaiac result negative.  Musculoskeletal:        General: No tenderness. Normal range of motion.     Cervical back: Normal range of motion and neck supple.  Lymphadenopathy:     Cervical: No cervical adenopathy.  Skin:    General: Skin is warm and dry.     Coloration: Skin is not pale.     Findings: No erythema or rash.  Neurological:     Mental Status: He is alert and oriented to person, place, and time.     Motor: No abnormal muscle tone.     Deep Tendon Reflexes: Reflexes are normal and symmetric. Reflexes normal.  Psychiatric:        Behavior: Behavior normal.        Thought Content: Thought content normal.        Judgment: Judgment normal.    BP 110/70 (BP Location: Left Arm, Patient Position: Sitting, Cuff Size: Normal)   Pulse 79   Temp 97.6 F (36.4 C) (Temporal)   Resp 18   Ht 5\' 7"  (1.702 m)   Wt 145 lb 12.8 oz (66.1 kg)   SpO2 97%   BMI 22.84 kg/m  Wt Readings from Last 3 Encounters:  04/21/20 145 lb 12.8 oz (66.1 kg)  10/06/19 148 lb 6.4 oz (67.3 kg)  12/25/18 151 lb 6 oz (68.7 kg)     Lab Results  Component Value Date   WBC 6.4 03/19/2017   HGB 16.2 03/19/2017   HCT 47.4 03/19/2017   PLT 178 03/19/2017   GLUCOSE 91 10/06/2019   CHOL 148 10/06/2019   TRIG 84.0 10/06/2019   HDL 44.40 10/06/2019   LDLCALC 87 10/06/2019   ALT 11 10/06/2019   AST 19 10/06/2019   NA 139 10/06/2019   K 3.8 10/06/2019   CL 101 10/06/2019   CREATININE 0.86 10/06/2019   BUN 8 10/06/2019   CO2 31 10/06/2019   TSH 1.22 01/18/2014   PSA 1.03 01/18/2014   HGBA1C 5.4 03/19/2017    CT HEAD WO CONTRAST  Result Date: 03/18/2017 CLINICAL DATA:  Confusion,  memory issues starting this morning EXAM: CT HEAD WITHOUT CONTRAST TECHNIQUE: Contiguous axial images were obtained from the base of the skull through the vertex without intravenous contrast. COMPARISON:  12/04/2016 FINDINGS: Brain: No intracranial hemorrhage, mass effect or midline shift. No definite acute cortical infarction. Ventricular size is stable from prior exam. No mass lesion is noted on this unenhanced scan. Vascular: Mild atherosclerotic calcifications of carotid siphon Skull: No skull fracture  is noted. Sinuses/Orbits: Mucosal thickening with partial opacification bilateral ethmoid air cells. Mucosal thickening bilateral sphenoid sinuses. Mild mucosal thickening maxillary sinuses. Other: None IMPRESSION: No acute intracranial abnormality. No definite acute cortical infarction. Paranasal sinuses disease as described above. Electronically Signed   By: Drew Crocker M.D.   On: 03/18/2017 09:47   MR MRA HEAD WO CONTRAST  Result Date: 03/19/2017 CLINICAL DATA:  Initial evaluation for acute stroke. Found to have punctate acute ischemic infarct within the medial right temporal lobe. EXAM: MRA HEAD WITHOUT CONTRAST TECHNIQUE: Angiographic images of the Circle of Willis were obtained using MRA technique without intravenous contrast. COMPARISON:  Prior MRI from 03/18/2017. FINDINGS: ANTERIOR CIRCULATION: Distal cervical segments of the internal carotid arteries are patent with antegrade flow. Petrous segments widely patent bilaterally. Scattered atheromatous irregularity throughout the cavernous right ICA with mild to moderate multifocal narrowing. No significant atheromatous stenosis within the cavernous left ICA. ICA termini widely patent. Left A1 segment widely patent. Right A1 segment hypoplastic and/ or absent, which likely accounts for the slightly diminutive right ICA is compared to the left. Anterior communicating artery normal. Anterior cerebral arteries patent to their distal aspects. Subtle  attenuation of the distal ACA is felt to be artifactual due to motion. M1 segments widely patent without stenosis or occlusion. MCA bifurcations normal. No proximal M2 occlusion. Distal MCA branches well opacified and symmetric. POSTERIOR CIRCULATION: Left vertebral artery dominant and patent to the vertebrobasilar junction without stenosis. Right vertebral artery diminutive and largely terminates in PICA posterior inferior cerebral arteries themselves are patent proximally. Basilar artery somewhat diminutive but patent to its distal aspect. Superior cerebral arteries patent bilaterally. Right PCA supplied via a hypoplastic P1 segment arising from the basilar as well as a mildly prominent right posterior communicating artery. There is a fetal type left PCA supplied via a widely patent left posterior communicating artery. PCAs are patent to their distal aspects. No aneurysm or vascular malformation. IMPRESSION: 1. Negative intracranial MRA for large vessel occlusion. No high-grade or correctable stenosis. 2. Atheromatous irregularity within the cavernous right ICA with associated mild to moderate multifocal narrowing. 3. Hypoplastic/absent right A1 segment, with overall slightly diminutive right internal carotid artery system. Anterior cerebral artery supplied via the left carotid artery system. 4. Predominant fetal type PCAs, with overall diminutive vertebrobasilar system. Right vertebral artery largely terminates in PICA. Electronically Signed   By: Drew Boga M.D.   On: 03/19/2017 19:05   MR Brain Wo Contrast  Result Date: 03/19/2017 CLINICAL DATA:  TIA.  Confusion and memory loss. EXAM: MRI HEAD WITHOUT CONTRAST TECHNIQUE: Multiplanar, multiecho pulse sequences of the brain and surrounding structures were obtained without intravenous contrast. COMPARISON:  Head CT 03/18/2017 FINDINGS: Brain: There is a 3 mm focus of diffusion restriction within the medial right temporal lobe at the hippocampus/para  hippocampal gyrus. There is mild periventricular white matter hyperintensity. No other focal parenchymal signal abnormality. No mass lesion or midline shift. No hydrocephalus or extra-axial fluid collection. The midline structures are normal. No age advanced or lobar predominant atrophy. Vascular: Major intracranial arterial and venous sinus flow voids are preserved. No evidence of chronic microhemorrhage or amyloid angiopathy. Skull and upper cervical spine: The visualized skull base, calvarium, upper cervical spine and extracranial soft tissues are normal. Sinuses/Orbits: Diffuse mild mucosal thickening of the paranasal sinuses. Small amount of right mastoid fluid. Normal orbits. IMPRESSION: 1. Punctate, 3 mm focus of acute ischemia within the medial right temporal lobe, near the hippocampus/para hippocampal gyrus. Ischemia in this area  can be seen in the setting of acute memory loss. 2. No hemorrhage or mass effect. 3. Minimal findings of chronic microvascular disease for age. Electronically Signed   By: Drew Jarred M.D.   On: 03/19/2017 00:07   EEG adult  Result Date: 03/19/2017 Drew Partridge, Drew [name]     03/19/2017  4:07 PM ELECTROENCEPHALOGRAM REPORT Date of Study: 03/19/2017 Patient's Name: REBA FUDA MRN: [id number] Date of Birth: 04/23/1956 Referring Provider: Cordelia Poche, Drew [name] Clinical History: 64 year old man with history of transient memory loss with ischemic stroke. Medications: acetaminophen (TYLENOL) aspirin enoxaparin (LOVENOX) injection 40 mg pravastatin (PRAVACHOL) tablet 20 mg senna-docusate (Senokot-S) tablet 1 tablet Technical Summary: A multichannel digital EEG recording measured by the international 10-20 system with electrodes applied with paste and impedances below 5000 ohms performed in our laboratory with EKG monitoring in an awake and asleep patient.  Hyperventilation and photic stimulation were not performed.  The digital EEG was referentially recorded, reformatted, and digitally  filtered in a variety of bipolar and referential montages for optimal display.  Description: The patient is awake and asleep during the recording.  During maximal wakefulness, there is a symmetric, medium voltage 9 Hz posterior dominant rhythm that attenuates with eye opening.  The record is symmetric.  During drowsiness and sleep, there is an increase in theta slowing of the background.  Vertex waves and symmetric sleep spindles were seen.  There were no epileptiform discharges or electrographic seizures seen.  EKG lead was unremarkable. Impression: This awake and asleep EEG is normal.  Clinical Correlation: A normal EEG does not exclude a clinical diagnosis of epilepsy.  If further clinical questions remain, prolonged EEG may be helpful.  Clinical correlation is advised. Drew Clines, Drew [name]     Assessment & Plan:  Plan  I am having Drew [name] maintain his Flaxseed Oil, Fish Oil, aspirin EC, lisinopril-hydrochlorothiazide, and pravastatin.  No orders of the defined types were placed in this encounter.   Problem List Items Addressed This Visit      Unprioritized   HTN (hypertension)    Well controlled, no changes to meds. Encouraged heart healthy diet such as the DASH diet and exercise as tolerated.       Relevant Orders   Lipid panel   CBC with Differential/Platelet   Comprehensive metabolic panel   Hyperlipidemia LDL goal <100    Encouraged heart healthy diet, increase exercise, avoid trans fats, consider a krill oil cap daily      Ischemic stroke ([place])    Stable No new symptoms       Preventative health care - Primary    ghm utd--- pt refuses vaccines  Check labs See AVS      Relevant Orders   Lipid panel   PSA   TSH   CBC with Differential/Platelet   Comprehensive metabolic panel    Other Visit Diagnoses    Hyperlipidemia, unspecified hyperlipidemia type       Relevant Orders   Lipid panel   Comprehensive metabolic panel      Follow-up: Return in about 6 months  (around 10/22/2020), or if symptoms worsen or fail to improve, for hypertension, hyperlipidemia, fasting.  Drew Held, Drew [name]

## 2020-04-21 NOTE — Patient Instructions (Signed)
Preventive Care 41-65 Years Old, Male Preventive care refers to lifestyle choices and visits with your health care provider that can promote health and wellness. This includes:  A yearly physical exam. This is also called an annual well check.  Regular dental and eye exams.  Immunizations.  Screening for certain conditions.  Healthy lifestyle choices, such as eating a healthy diet, getting regular exercise, not using drugs or products that contain nicotine and tobacco, and limiting alcohol use. What can I expect for my preventive care visit? Physical exam Your health care provider will check:  Height and weight. These may be used to calculate body mass index (BMI), which is a measurement that tells if you are at a healthy weight.  Heart rate and blood pressure.  Your skin for abnormal spots. Counseling Your health care provider may ask you questions about:  Alcohol, tobacco, and drug use.  Emotional well-being.  Home and relationship well-being.  Sexual activity.  Eating habits.  Work and work Statistician. What immunizations do I need?  Influenza (flu) vaccine  This is recommended every year. Tetanus, diphtheria, and pertussis (Tdap) vaccine  You may need a Td booster every 10 years. Varicella (chickenpox) vaccine  You may need this vaccine if you have not already been vaccinated. Zoster (shingles) vaccine  You may need this after age 64. Measles, mumps, and rubella (MMR) vaccine  You may need at least one dose of MMR if you were born in 1957 or later. You may also need a second dose. Pneumococcal conjugate (PCV13) vaccine  You may need this if you have certain conditions and were not previously vaccinated. Pneumococcal polysaccharide (PPSV23) vaccine  You may need one or two doses if you smoke cigarettes or if you have certain conditions. Meningococcal conjugate (MenACWY) vaccine  You may need this if you have certain conditions. Hepatitis A  vaccine  You may need this if you have certain conditions or if you travel or work in places where you may be exposed to hepatitis A. Hepatitis B vaccine  You may need this if you have certain conditions or if you travel or work in places where you may be exposed to hepatitis B. Haemophilus influenzae type b (Hib) vaccine  You may need this if you have certain risk factors. Human papillomavirus (HPV) vaccine  If recommended by your health care provider, you may need three doses over 6 months. You may receive vaccines as individual doses or as more than one vaccine together in one shot (combination vaccines). Talk with your health care provider about the risks and benefits of combination vaccines. What tests do I need? Blood tests  Lipid and cholesterol levels. These may be checked every 5 years, or more frequently if you are over 60 years old.  Hepatitis C test.  Hepatitis B test. Screening  Lung cancer screening. You may have this screening every year starting at age 43 if you have a 30-pack-year history of smoking and currently smoke or have quit within the past 15 years.  Prostate cancer screening. Recommendations will vary depending on your family history and other risks.  Colorectal cancer screening. All adults should have this screening starting at age 72 and continuing until age 2. Your health care provider may recommend screening at age 14 if you are at increased risk. You will have tests every 1-10 years, depending on your results and the type of screening test.  Diabetes screening. This is done by checking your blood sugar (glucose) after you have not eaten  for a while (fasting). You may have this done every 1-3 years.  Sexually transmitted disease (STD) testing. Follow these instructions at home: Eating and drinking  Eat a diet that includes fresh fruits and vegetables, whole grains, lean protein, and low-fat dairy products.  Take vitamin and mineral supplements as  recommended by your health care provider.  Do not drink alcohol if your health care provider tells you not to drink.  If you drink alcohol: ? Limit how much you have to 0-2 drinks a day. ? Be aware of how much alcohol is in your drink. In the [place] one drink equals one 12 oz bottle of beer (355 mL), one 5 oz glass of wine (148 mL), or one 1 oz glass of hard liquor (44 mL). Lifestyle  Take daily care of your teeth and gums.  Stay active. Exercise for at least 30 minutes on 5 or more days each week.  Do not use any products that contain nicotine or tobacco, such as cigarettes, e-cigarettes, and chewing tobacco. If you need help quitting, ask your health care provider.  If you are sexually active, practice safe sex. Use a condom or other form of protection to prevent STIs (sexually transmitted infections).  Talk with your health care provider about taking a low-dose aspirin every day starting at age 53. What's next?  Go to your health care provider once a year for a well check visit.  Ask your health care provider how often you should have your eyes and teeth checked.  Stay up to date on all vaccines. This information is not intended to replace advice given to you by your health care provider. Make sure you discuss any questions you have with your health care provider. Document Revised: 11/20/2018 Document Reviewed: 11/20/2018 Elsevier Patient Education  2020 [employer].

## 2020-04-22 DIAGNOSIS — Z0000 Encounter for general adult medical examination without abnormal findings: Secondary | ICD-10-CM | POA: Insufficient documentation

## 2020-04-22 LAB — LIPID PANEL
Cholesterol: 154 mg/dL (ref 0–200)
HDL: 44.7 mg/dL (ref >39.00)
LDL Cholesterol: 95 mg/dL (ref 0–99)
NonHDL: 109.23
Total CHOL/HDL Ratio: 3
Triglycerides: 73 mg/dL (ref 0.0–149.0)
VLDL: 14.6 mg/dL (ref 0.0–40.0)

## 2020-04-22 LAB — COMPREHENSIVE METABOLIC PANEL
ALT: 11 U/L (ref 0–53)
AST: 19 U/L (ref 0–37)
Albumin: 3.9 g/dL (ref 3.5–5.2)
Alkaline Phosphatase: 59 U/L (ref 39–117)
BUN: 14 mg/dL (ref 6–23)
CO2: 33 mEq/L — ABNORMAL HIGH (ref 19–32)
Calcium: 9.3 mg/dL (ref 8.4–10.5)
Chloride: 101 mEq/L (ref 96–112)
Creatinine, Ser: 0.91 mg/dL (ref 0.40–1.50)
GFR: 83.63 mL/min (ref >60.00)
Glucose, Bld: 75 mg/dL (ref 70–99)
Potassium: 4.5 mEq/L (ref 3.5–5.1)
Sodium: 138 mEq/L (ref 135–145)
Total Bilirubin: 0.9 mg/dL (ref 0.2–1.2)
Total Protein: 6.2 g/dL (ref 6.0–8.3)

## 2020-04-22 LAB — TSH: TSH: 0.95 u[IU]/mL (ref 0.35–4.50)

## 2020-04-22 LAB — CBC WITH DIFFERENTIAL/PLATELET
Basophils Absolute: 0.1 10*3/uL (ref 0.0–0.1)
Basophils Relative: 1.5 % (ref 0.0–3.0)
Eosinophils Absolute: 0.4 10*3/uL (ref 0.0–0.7)
Eosinophils Relative: 6.2 % — ABNORMAL HIGH (ref 0.0–5.0)
HCT: 46.8 % (ref 39.0–52.0)
Hemoglobin: 16 g/dL (ref 13.0–17.0)
Lymphocytes Relative: 23.7 % (ref 12.0–46.0)
Lymphs Abs: 1.7 10*3/uL (ref 0.7–4.0)
MCHC: 34.2 g/dL (ref 30.0–36.0)
MCV: 95.9 fl (ref 78.0–100.0)
Monocytes Absolute: 0.6 10*3/uL (ref 0.1–1.0)
Monocytes Relative: 8.8 % (ref 3.0–12.0)
Neutro Abs: 4.3 10*3/uL (ref 1.4–7.7)
Neutrophils Relative %: 59.8 % (ref 43.0–77.0)
Platelets: 212 10*3/uL (ref 150.0–400.0)
RBC: 4.88 Mil/uL (ref 4.22–5.81)
RDW: 12.4 % (ref 11.5–15.5)
WBC: 7.2 10*3/uL (ref 4.0–10.5)

## 2020-04-22 LAB — PSA: PSA: 2.18 ng/mL (ref 0.10–4.00)

## 2020-04-22 NOTE — Assessment & Plan Note (Signed)
Encouraged heart healthy diet, increase exercise, avoid trans fats, consider a krill oil cap daily 

## 2020-04-22 NOTE — Assessment & Plan Note (Signed)
Well controlled, no changes to meds. Encouraged heart healthy diet such as the DASH diet and exercise as tolerated.  °

## 2020-04-22 NOTE — Assessment & Plan Note (Signed)
ghm utd--- pt refuses vaccines  Check labs See AVS

## 2020-04-22 NOTE — Assessment & Plan Note (Signed)
Stable No new symptoms  

## 2020-10-04 ENCOUNTER — Other Ambulatory Visit: Payer: Self-pay | Admitting: Family Medicine

## 2020-10-04 DIAGNOSIS — E785 Hyperlipidemia, unspecified: Secondary | ICD-10-CM

## 2020-10-06 ENCOUNTER — Ambulatory Visit (INDEPENDENT_AMBULATORY_CARE_PROVIDER_SITE_OTHER): Payer: Medicare Other | Admitting: Family Medicine

## 2020-10-06 ENCOUNTER — Other Ambulatory Visit: Payer: Self-pay

## 2020-10-06 ENCOUNTER — Encounter: Payer: Self-pay | Admitting: Family Medicine

## 2020-10-06 VITALS — BP 110/70 | HR 82 | Temp 98.9°F | Resp 18 | Ht 67.0 in | Wt 150.4 lb

## 2020-10-06 DIAGNOSIS — R739 Hyperglycemia, unspecified: Secondary | ICD-10-CM

## 2020-10-06 DIAGNOSIS — R42 Dizziness and giddiness: Secondary | ICD-10-CM

## 2020-10-06 DIAGNOSIS — E785 Hyperlipidemia, unspecified: Secondary | ICD-10-CM | POA: Diagnosis not present

## 2020-10-06 DIAGNOSIS — I10 Essential (primary) hypertension: Secondary | ICD-10-CM | POA: Diagnosis not present

## 2020-10-06 DIAGNOSIS — I639 Cerebral infarction, unspecified: Secondary | ICD-10-CM | POA: Diagnosis not present

## 2020-10-06 MED ORDER — LISINOPRIL-HYDROCHLOROTHIAZIDE 20-12.5 MG PO TABS: 2.0000 | ORAL_TABLET | Freq: Every day | ORAL | 1 refills | Status: DC

## 2020-10-06 NOTE — Progress Notes (Signed)
Patient ID: Drew [name], male    DOB: 1956/06/11  Age: 64 y.o. MRN: [id number]    Subjective:  Subjective  HPI Drew [name] presents for f/u er for dizziness and sweating.   Symptoms have completely resolved and not come back.   Er note reviewed   Review of Systems  Constitutional: Negative for appetite change, diaphoresis, fatigue and unexpected weight change.  Eyes: Negative for pain, redness and visual disturbance.  Respiratory: Negative for cough, chest tightness, shortness of breath and wheezing.   Cardiovascular: Negative for chest pain, palpitations and leg swelling.  Endocrine: Negative for cold intolerance, heat intolerance, polydipsia, polyphagia and polyuria.  Genitourinary: Negative for difficulty urinating, dysuria and frequency.  Neurological: Negative for dizziness, light-headedness, numbness and headaches.    History Past Medical History:  Diagnosis Date  . CVA (cerebral vascular accident) ([place])   . High cholesterol   . Hypertension   . Memory loss   . Tubular adenoma of colon     He has a past surgical history that includes Hernia repair (Left, 2004); Colonoscopy; and Polypectomy.   His family history includes Breast cancer in his sister; Berenice Primas' disease in his mother; Heart disease in his father, mother, paternal uncle, paternal uncle, paternal uncle, paternal uncle, paternal uncle, and paternal uncle; Hyperlipidemia in his brother; Hypertension in his brother and brother.He reports that he quit smoking about 32 years ago. His smoking use included cigarettes. He has a 15.00 pack-year smoking history. His smokeless tobacco use includes chew. He reports that he does not drink alcohol and does not use drugs.  No current facility-administered medications on file prior to visit.   Current Outpatient Medications on File Prior to Visit  Medication Sig Dispense Refill  . aspirin EC 325 MG tablet Take 1 tablet (325 mg total) by mouth daily. 30 tablet 0  . Flaxseed,  Linseed, (FLAXSEED OIL) 1200 MG CAPS Take 2,400 mg by mouth daily.     . Omega-3 Fatty Acids (FISH OIL) 1200 MG CAPS Take 1,200 mg by mouth 2 (two) times daily.     . pravastatin (PRAVACHOL) 40 MG tablet TAKE 1 TABLET(40 MG) BY MOUTH DAILY (Patient taking differently: Take 40 mg by mouth daily. ) 90 tablet 1     Objective:  Objective  Physical Exam Vitals and nursing note reviewed.  Constitutional:      General: He is sleeping.     Appearance: He is well-developed.  HENT:     Head: Normocephalic and atraumatic.  Eyes:     Pupils: Pupils are equal, round, and reactive to light.  Neck:     Thyroid: No thyromegaly.  Cardiovascular:     Rate and Rhythm: Normal rate and regular rhythm.     Heart sounds: No murmur heard.   Pulmonary:     Effort: Pulmonary effort is normal. No respiratory distress.     Breath sounds: Normal breath sounds. No wheezing or rales.  Chest:     Chest wall: No tenderness.  Musculoskeletal:        General: No tenderness.     Cervical back: Normal range of motion and neck supple.  Skin:    General: Skin is warm and dry.  Neurological:     General: No focal deficit present.     Mental Status: He is oriented to person, place, and time.  Psychiatric:        Behavior: Behavior normal.        Thought Content: Thought content normal.  Judgment: Judgment normal.    BP 110/70 (BP Location: Right Arm, Patient Position: Sitting, Cuff Size: Normal)   Pulse 82   Temp 98.9 F (37.2 C) (Oral)   Resp 18   Ht 5\' 7"  (1.702 m)   Wt 150 lb 6.4 oz (68.2 kg)   SpO2 98%   BMI 23.56 kg/m  Wt Readings from Last 3 Encounters:  10/09/20 150 lb 5.7 oz (68.2 kg)  10/06/20 150 lb 6.4 oz (68.2 kg)  04/21/20 145 lb 12.8 oz (66.1 kg)     Lab Results  Component Value Date   WBC 9.3 10/09/2020   HGB 16.8 10/09/2020   HCT 49.0 10/09/2020   PLT 191 10/09/2020   GLUCOSE 83 10/08/2020   CHOL 155 10/06/2020   TRIG 100 10/06/2020   HDL 48 10/06/2020   LDLCALC 87  10/06/2020   ALT 17 10/08/2020   AST 37 10/08/2020   NA 138 10/08/2020   K 4.0 10/08/2020   CL 102 10/08/2020   CREATININE 0.92 10/08/2020   BUN 13 10/08/2020   CO2 20 (L) 10/08/2020   TSH 0.967 10/09/2020   PSA 2.18 04/21/2020   HGBA1C 5.4 10/06/2020    CT HEAD WO CONTRAST  Result Date: 03/18/2017 CLINICAL DATA:  Confusion, memory issues starting this morning EXAM: CT HEAD WITHOUT CONTRAST TECHNIQUE: Contiguous axial images were obtained from the base of the skull through the vertex without intravenous contrast. COMPARISON:  12/04/2016 FINDINGS: Brain: No intracranial hemorrhage, mass effect or midline shift. No definite acute cortical infarction. Ventricular size is stable from prior exam. No mass lesion is noted on this unenhanced scan. Vascular: Mild atherosclerotic calcifications of carotid siphon Skull: No skull fracture is noted. Sinuses/Orbits: Mucosal thickening with partial opacification bilateral ethmoid air cells. Mucosal thickening bilateral sphenoid sinuses. Mild mucosal thickening maxillary sinuses. Other: None IMPRESSION: No acute intracranial abnormality. No definite acute cortical infarction. Paranasal sinuses disease as described above. Electronically Signed   By: Lahoma Crocker M.D.   On: 03/18/2017 09:47   MR MRA HEAD WO CONTRAST  Result Date: 03/19/2017 CLINICAL DATA:  Initial evaluation for acute stroke. Found to have punctate acute ischemic infarct within the medial right temporal lobe. EXAM: MRA HEAD WITHOUT CONTRAST TECHNIQUE: Angiographic images of the Circle of Willis were obtained using MRA technique without intravenous contrast. COMPARISON:  Prior MRI from 03/18/2017. FINDINGS: ANTERIOR CIRCULATION: Distal cervical segments of the internal carotid arteries are patent with antegrade flow. Petrous segments widely patent bilaterally. Scattered atheromatous irregularity throughout the cavernous right ICA with mild to moderate multifocal narrowing. No significant atheromatous  stenosis within the cavernous left ICA. ICA termini widely patent. Left A1 segment widely patent. Right A1 segment hypoplastic and/ or absent, which likely accounts for the slightly diminutive right ICA is compared to the left. Anterior communicating artery normal. Anterior cerebral arteries patent to their distal aspects. Subtle attenuation of the distal ACA is felt to be artifactual due to motion. M1 segments widely patent without stenosis or occlusion. MCA bifurcations normal. No proximal M2 occlusion. Distal MCA branches well opacified and symmetric. POSTERIOR CIRCULATION: Left vertebral artery dominant and patent to the vertebrobasilar junction without stenosis. Right vertebral artery diminutive and largely terminates in PICA posterior inferior cerebral arteries themselves are patent proximally. Basilar artery somewhat diminutive but patent to its distal aspect. Superior cerebral arteries patent bilaterally. Right PCA supplied via a hypoplastic P1 segment arising from the basilar as well as a mildly prominent right posterior communicating artery. There is a fetal type left  PCA supplied via a widely patent left posterior communicating artery. PCAs are patent to their distal aspects. No aneurysm or vascular malformation. IMPRESSION: 1. Negative intracranial MRA for large vessel occlusion. No high-grade or correctable stenosis. 2. Atheromatous irregularity within the cavernous right ICA with associated mild to moderate multifocal narrowing. 3. Hypoplastic/absent right A1 segment, with overall slightly diminutive right internal carotid artery system. Anterior cerebral artery supplied via the left carotid artery system. 4. Predominant fetal type PCAs, with overall diminutive vertebrobasilar system. Right vertebral artery largely terminates in PICA. Electronically Signed   By: Jeannine Boga M.D.   On: 03/19/2017 19:05   MR Brain Wo Contrast  Result Date: 03/19/2017 CLINICAL DATA:  TIA.  Confusion and  memory loss. EXAM: MRI HEAD WITHOUT CONTRAST TECHNIQUE: Multiplanar, multiecho pulse sequences of the brain and surrounding structures were obtained without intravenous contrast. COMPARISON:  Head CT 03/18/2017 FINDINGS: Brain: There is a 3 mm focus of diffusion restriction within the medial right temporal lobe at the hippocampus/para hippocampal gyrus. There is mild periventricular white matter hyperintensity. No other focal parenchymal signal abnormality. No mass lesion or midline shift. No hydrocephalus or extra-axial fluid collection. The midline structures are normal. No age advanced or lobar predominant atrophy. Vascular: Major intracranial arterial and venous sinus flow voids are preserved. No evidence of chronic microhemorrhage or amyloid angiopathy. Skull and upper cervical spine: The visualized skull base, calvarium, upper cervical spine and extracranial soft tissues are normal. Sinuses/Orbits: Diffuse mild mucosal thickening of the paranasal sinuses. Small amount of right mastoid fluid. Normal orbits. IMPRESSION: 1. Punctate, 3 mm focus of acute ischemia within the medial right temporal lobe, near the hippocampus/para hippocampal gyrus. Ischemia in this area can be seen in the setting of acute memory loss. 2. No hemorrhage or mass effect. 3. Minimal findings of chronic microvascular disease for age. Electronically Signed   By: Ulyses Jarred M.D.   On: 03/19/2017 00:07   EEG adult  Result Date: 03/19/2017 Pieter Partridge, DO     03/19/2017  4:07 PM ELECTROENCEPHALOGRAM REPORT Date of Study: 03/19/2017 Patient's Name: Drew [name] MRN: [id number] Date of Birth: [birth year] Referring Provider: Cordelia Poche, MD Clinical History: 63 year old man with history of transient memory loss with ischemic stroke. Medications: acetaminophen (TYLENOL) aspirin enoxaparin (LOVENOX) injection 40 mg pravastatin (PRAVACHOL) tablet 20 mg senna-docusate (Senokot-S) tablet 1 tablet Technical Summary: A multichannel digital EEG  recording measured by the international 10-20 system with electrodes applied with paste and impedances below 5000 ohms performed in our laboratory with EKG monitoring in an awake and asleep patient.  Hyperventilation and photic stimulation were not performed.  The digital EEG was referentially recorded, reformatted, and digitally filtered in a variety of bipolar and referential montages for optimal display.  Description: The patient is awake and asleep during the recording.  During maximal wakefulness, there is a symmetric, medium voltage 9 Hz posterior dominant rhythm that attenuates with eye opening.  The record is symmetric.  During drowsiness and sleep, there is an increase in theta slowing of the background.  Vertex waves and symmetric sleep spindles were seen.  There were no epileptiform discharges or electrographic seizures seen.  EKG lead was unremarkable. Impression: This awake and asleep EEG is normal.  Clinical Correlation: A normal EEG does not exclude a clinical diagnosis of epilepsy.  If further clinical questions remain, prolonged EEG may be helpful.  Clinical correlation is advised. Metta Clines, DO     Assessment & Plan:  Plan  I am  having Drew [name]. Drew [name] maintain his Flaxseed Oil, Fish Oil, aspirin EC, pravastatin, and lisinopril-hydrochlorothiazide.  Meds ordered this encounter  Medications  . lisinopril-hydrochlorothiazide (ZESTORETIC) 20-12.5 MG tablet    Sig: Take 2 tablets by mouth daily. Needs ov    Dispense:  180 tablet    Refill:  1    Problem List Items Addressed This Visit      Unprioritized   HTN (hypertension)    Well controlled, no changes to meds. Encouraged heart healthy diet such as the DASH diet and exercise as tolerated.       Relevant Medications   lisinopril-hydrochlorothiazide (ZESTORETIC) 20-12.5 MG tablet   Hyperlipidemia LDL goal <100    Encouraged heart healthy diet, increase exercise, avoid trans fats, consider a krill oil cap daily      Relevant  Medications   lisinopril-hydrochlorothiazide (ZESTORETIC) 20-12.5 MG tablet   Vertigo    Resolved Er note reviewed Repeat labs today rto if symptoms return or go to er        Other Visit Diagnoses    Hyperglycemia    -  Primary   Relevant Orders   Comprehensive metabolic panel (Completed)   Hemoglobin A1c (Completed)   Essential hypertension       Relevant Medications   lisinopril-hydrochlorothiazide (ZESTORETIC) 20-12.5 MG tablet   Other Relevant Orders   Comprehensive metabolic panel (Completed)   Hemoglobin A1c (Completed)   Hyperlipidemia, unspecified hyperlipidemia type       Relevant Medications   lisinopril-hydrochlorothiazide (ZESTORETIC) 20-12.5 MG tablet   Other Relevant Orders   Lipid panel (Completed)   Dizziness          Follow-up: Return in about 7 months (around 05/08/2021), or if symptoms worsen or fail to improve, for annual exam, fasting.  [name], DO

## 2020-10-06 NOTE — Patient Instructions (Signed)
DASH Eating Plan DASH stands for "Dietary Approaches to Stop Hypertension." The DASH eating plan is a healthy eating plan that has been shown to reduce high blood pressure (hypertension). It may also reduce your risk for type 2 diabetes, heart disease, and stroke. The DASH eating plan may also help with weight loss. What are tips for following this plan?  General guidelines  Avoid eating more than 2,300 mg (milligrams) of salt (sodium) a day. If you have hypertension, you may need to reduce your sodium intake to 1,500 mg a day.  Limit alcohol intake to no more than 1 drink a day for nonpregnant women and 2 drinks a day for men. One drink equals 12 oz of beer, 5 oz of wine, or 1 oz of hard liquor.  Work with your health care provider to maintain a healthy body weight or to lose weight. Ask what an ideal weight is for you.  Get at least 30 minutes of exercise that causes your heart to beat faster (aerobic exercise) most days of the week. Activities may include walking, swimming, or biking.  Work with your health care provider or diet and nutrition specialist (dietitian) to adjust your eating plan to your individual calorie needs. Reading food labels   Check food labels for the amount of sodium per serving. Choose foods with less than 5 percent of the Daily Value of sodium. Generally, foods with less than 300 mg of sodium per serving fit into this eating plan.  To find whole grains, look for the word "whole" as the first word in the ingredient list. Shopping  Buy products labeled as "low-sodium" or "no salt added."  Buy fresh foods. Avoid canned foods and premade or frozen meals. Cooking  Avoid adding salt when cooking. Use salt-free seasonings or herbs instead of table salt or sea salt. Check with your health care provider or pharmacist before using salt substitutes.  Do not fry foods. Cook foods using healthy methods such as baking, boiling, grilling, and broiling instead.  Cook with  heart-healthy oils, such as olive, canola, soybean, or sunflower oil. Meal planning  Eat a balanced diet that includes: ? 5 or more servings of fruits and vegetables each day. At each meal, try to fill half of your plate with fruits and vegetables. ? Up to 6-8 servings of whole grains each day. ? Less than 6 oz of lean meat, poultry, or fish each day. A 3-oz serving of meat is about the same size as a deck of cards. One egg equals 1 oz. ? 2 servings of low-fat dairy each day. ? A serving of nuts, seeds, or beans 5 times each week. ? Heart-healthy fats. Healthy fats called Omega-3 fatty acids are found in foods such as flaxseeds and coldwater fish, like sardines, salmon, and mackerel.  Limit how much you eat of the following: ? Canned or prepackaged foods. ? Food that is high in trans fat, such as fried foods. ? Food that is high in saturated fat, such as fatty meat. ? Sweets, desserts, sugary drinks, and other foods with added sugar. ? Full-fat dairy products.  Do not salt foods before eating.  Try to eat at least 2 vegetarian meals each week.  Eat more home-cooked food and less restaurant, buffet, and fast food.  When eating at a restaurant, ask that your food be prepared with less salt or no salt, if possible. What foods are recommended? The items listed may not be a complete list. Talk with your dietitian about   what dietary choices are best for you. Grains Whole-grain or whole-wheat bread. Whole-grain or whole-wheat pasta. Dempster rice. Oatmeal. Quinoa. Bulgur. Whole-grain and low-sodium cereals. Pita bread. Low-fat, low-sodium crackers. Whole-wheat flour tortillas. Vegetables Fresh or frozen vegetables (raw, steamed, roasted, or grilled). Low-sodium or reduced-sodium tomato and vegetable juice. Low-sodium or reduced-sodium tomato sauce and tomato paste. Low-sodium or reduced-sodium canned vegetables. Fruits All fresh, dried, or frozen fruit. Canned fruit in natural juice (without  added sugar). Meat and other protein foods Skinless chicken or turkey. Ground chicken or turkey. Pork with fat trimmed off. Fish and seafood. Egg whites. Dried beans, peas, or lentils. Unsalted nuts, nut butters, and seeds. Unsalted canned beans. Lean cuts of beef with fat trimmed off. Low-sodium, lean deli meat. Dairy Low-fat (1%) or fat-free (skim) milk. Fat-free, low-fat, or reduced-fat cheeses. Nonfat, low-sodium ricotta or cottage cheese. Low-fat or nonfat yogurt. Low-fat, low-sodium cheese. Fats and oils Soft margarine without trans fats. Vegetable oil. Low-fat, reduced-fat, or light mayonnaise and salad dressings (reduced-sodium). Canola, safflower, olive, soybean, and sunflower oils. Avocado. Seasoning and other foods Herbs. Spices. Seasoning mixes without salt. Unsalted popcorn and pretzels. Fat-free sweets. What foods are not recommended? The items listed may not be a complete list. Talk with your dietitian about what dietary choices are best for you. Grains Baked goods made with fat, such as croissants, muffins, or some breads. Dry pasta or rice meal packs. Vegetables Creamed or fried vegetables. Vegetables in a cheese sauce. Regular canned vegetables (not low-sodium or reduced-sodium). Regular canned tomato sauce and paste (not low-sodium or reduced-sodium). Regular tomato and vegetable juice (not low-sodium or reduced-sodium). Pickles. Olives. Fruits Canned fruit in a light or heavy syrup. Fried fruit. Fruit in cream or butter sauce. Meat and other protein foods Fatty cuts of meat. Ribs. Fried meat. Bacon. Sausage. Bologna and other processed lunch meats. Salami. Fatback. Hotdogs. Bratwurst. Salted nuts and seeds. Canned beans with added salt. Canned or smoked fish. Whole eggs or egg yolks. Chicken or turkey with skin. Dairy Whole or 2% milk, cream, and half-and-half. Whole or full-fat cream cheese. Whole-fat or sweetened yogurt. Full-fat cheese. Nondairy creamers. Whipped toppings.  Processed cheese and cheese spreads. Fats and oils Butter. Stick margarine. Lard. Shortening. Ghee. Bacon fat. Tropical oils, such as coconut, palm kernel, or palm oil. Seasoning and other foods Salted popcorn and pretzels. Onion salt, garlic salt, seasoned salt, table salt, and sea salt. Worcestershire sauce. Tartar sauce. Barbecue sauce. Teriyaki sauce. Soy sauce, including reduced-sodium. Steak sauce. Canned and packaged gravies. Fish sauce. Oyster sauce. Cocktail sauce. Horseradish that you find on the shelf. Ketchup. Mustard. Meat flavorings and tenderizers. Bouillon cubes. Hot sauce and Tabasco sauce. Premade or packaged marinades. Premade or packaged taco seasonings. Relishes. Regular salad dressings. Where to find more information:  National Heart, Lung, and Blood Institute: www.nhlbi.nih.gov  American Heart Association: www.heart.org Summary  The DASH eating plan is a healthy eating plan that has been shown to reduce high blood pressure (hypertension). It may also reduce your risk for type 2 diabetes, heart disease, and stroke.  With the DASH eating plan, you should limit salt (sodium) intake to 2,300 mg a day. If you have hypertension, you may need to reduce your sodium intake to 1,500 mg a day.  When on the DASH eating plan, aim to eat more fresh fruits and vegetables, whole grains, lean proteins, low-fat dairy, and heart-healthy fats.  Work with your health care provider or diet and nutrition specialist (dietitian) to adjust your eating plan to your   individual calorie needs. This information is not intended to replace advice given to you by your health care provider. Make sure you discuss any questions you have with your health care provider. Document Revised: 11/08/2017 Document Reviewed: 11/19/2016 Elsevier Patient Education  2020 [employer]  

## 2020-10-07 ENCOUNTER — Telehealth: Payer: Self-pay | Admitting: Family Medicine

## 2020-10-07 LAB — COMPREHENSIVE METABOLIC PANEL
AG Ratio: 1.6 (calc) (ref 1.0–2.5)
ALT: 16 U/L (ref 9–46)
AST: 20 U/L (ref 10–35)
Albumin: 4 g/dL (ref 3.6–5.1)
Alkaline phosphatase (APISO): 57 U/L (ref 35–144)
BUN: 9 mg/dL (ref 7–25)
CO2: 31 mmol/L (ref 20–32)
Calcium: 9.7 mg/dL (ref 8.6–10.3)
Chloride: 100 mmol/L (ref 98–110)
Creat: 0.91 mg/dL (ref 0.70–1.25)
Globulin: 2.5 g/dL (calc) (ref 1.9–3.7)
Glucose, Bld: 87 mg/dL (ref 65–99)
Potassium: 4.8 mmol/L (ref 3.5–5.3)
Sodium: 139 mmol/L (ref 135–146)
Total Bilirubin: 0.5 mg/dL (ref 0.2–1.2)
Total Protein: 6.5 g/dL (ref 6.1–8.1)

## 2020-10-07 LAB — LIPID PANEL
Cholesterol: 155 mg/dL (ref <200)
HDL: 48 mg/dL (ref >=40)
LDL Cholesterol (Calc): 87 mg/dL (calc)
Non-HDL Cholesterol (Calc): 107 mg/dL (calc) (ref <130)
Total CHOL/HDL Ratio: 3.2 (calc) (ref <5.0)
Triglycerides: 100 mg/dL (ref <150)

## 2020-10-07 LAB — HEMOGLOBIN A1C
Hgb A1c MFr Bld: 5.4 % of total Hgb (ref <5.7)
Mean Plasma Glucose: 108 (calc)
eAG (mmol/L): 6 (calc)

## 2020-10-07 NOTE — Telephone Encounter (Signed)
Spoke with patient's wife. She states on Monday he was seen at the ER-he was seating profusely, dizzy and vomiting. He was evaluated at the hospital and she states no information as to why this was happening really was found. He was seen for a follow up visit here yesterday-labs all normal however she states in the ER his potassium was low.   Since this morning he woke up feeling the same way he did Monday. Sweating, dizziness, fatigue, vomiting. No fever. No urinary sxs or abdominal pain. No SOB or chest pain. Please advise on further instructions.

## 2020-10-07 NOTE — Telephone Encounter (Signed)
Patient is experiencing some dizziness, nausea, weakness and sometimes sweating. Wife would like to speak to someone

## 2020-10-07 NOTE — Telephone Encounter (Signed)
See his labs

## 2020-10-08 ENCOUNTER — Emergency Department (HOSPITAL_COMMUNITY): Payer: Medicare Other

## 2020-10-08 ENCOUNTER — Inpatient Hospital Stay (HOSPITAL_COMMUNITY)
Admission: EM | Admit: 2020-10-08 | Discharge: 2020-10-10 | DRG: 641 | Disposition: A | Discharge status: Home / Self Care | Payer: Medicare Other | Attending: Internal Medicine | Admitting: Internal Medicine

## 2020-10-08 DIAGNOSIS — E872 Acidosis, unspecified: Secondary | ICD-10-CM | POA: Diagnosis present

## 2020-10-08 DIAGNOSIS — E785 Hyperlipidemia, unspecified: Secondary | ICD-10-CM | POA: Diagnosis present

## 2020-10-08 DIAGNOSIS — Z83438 Family history of other disorder of lipoprotein metabolism and other lipidemia: Secondary | ICD-10-CM

## 2020-10-08 DIAGNOSIS — H8110 Benign paroxysmal vertigo, unspecified ear: Secondary | ICD-10-CM | POA: Diagnosis present

## 2020-10-08 DIAGNOSIS — D582 Other hemoglobinopathies: Secondary | ICD-10-CM | POA: Diagnosis present

## 2020-10-08 DIAGNOSIS — E860 Dehydration: Principal | ICD-10-CM | POA: Diagnosis present

## 2020-10-08 DIAGNOSIS — Z79899 Other long term (current) drug therapy: Secondary | ICD-10-CM

## 2020-10-08 DIAGNOSIS — R42 Dizziness and giddiness: Secondary | ICD-10-CM | POA: Diagnosis not present

## 2020-10-08 DIAGNOSIS — I10 Essential (primary) hypertension: Secondary | ICD-10-CM | POA: Diagnosis present

## 2020-10-08 DIAGNOSIS — D751 Secondary polycythemia: Secondary | ICD-10-CM | POA: Diagnosis present

## 2020-10-08 DIAGNOSIS — Z8249 Family history of ischemic heart disease and other diseases of the circulatory system: Secondary | ICD-10-CM

## 2020-10-08 DIAGNOSIS — Z7982 Long term (current) use of aspirin: Secondary | ICD-10-CM

## 2020-10-08 DIAGNOSIS — Z20822 Contact with and (suspected) exposure to covid-19: Secondary | ICD-10-CM | POA: Diagnosis present

## 2020-10-08 DIAGNOSIS — Z8673 Personal history of transient ischemic attack (TIA), and cerebral infarction without residual deficits: Secondary | ICD-10-CM

## 2020-10-08 DIAGNOSIS — Z803 Family history of malignant neoplasm of breast: Secondary | ICD-10-CM

## 2020-10-08 DIAGNOSIS — Z720 Tobacco use: Secondary | ICD-10-CM

## 2020-10-08 LAB — COMPREHENSIVE METABOLIC PANEL
ALT: 17 U/L (ref 0–44)
AST: 37 U/L (ref 15–41)
Albumin: 3.5 g/dL (ref 3.5–5.0)
Alkaline Phosphatase: 52 U/L (ref 38–126)
Anion gap: 15 (ref 5–15)
BUN: 13 mg/dL (ref 8–23)
CO2: 20 mmol/L — ABNORMAL LOW (ref 22–32)
Calcium: 9.1 mg/dL (ref 8.9–10.3)
Chloride: 102 mmol/L (ref 98–111)
Creatinine, Ser: 0.92 mg/dL (ref 0.61–1.24)
GFR, Estimated: >60 mL/min (ref >60)
Glucose, Bld: 83 mg/dL (ref 70–99)
Potassium: 4.2 mmol/L (ref 3.5–5.1)
Sodium: 137 mmol/L (ref 135–145)
Total Bilirubin: 2.1 mg/dL — ABNORMAL HIGH (ref 0.3–1.2)
Total Protein: 6.4 g/dL — ABNORMAL LOW (ref 6.5–8.1)

## 2020-10-08 LAB — CBC WITH DIFFERENTIAL/PLATELET
Abs Immature Granulocytes: 0.02 10*3/uL (ref 0.00–0.07)
Basophils Absolute: 0.1 10*3/uL (ref 0.0–0.1)
Basophils Relative: 1 %
Eosinophils Absolute: 0.2 10*3/uL (ref 0.0–0.5)
Eosinophils Relative: 2 %
HCT: 50.6 % (ref 39.0–52.0)
Hemoglobin: 17.5 g/dL — ABNORMAL HIGH (ref 13.0–17.0)
Immature Granulocytes: 0 %
Lymphocytes Relative: 14 %
Lymphs Abs: 1.3 10*3/uL (ref 0.7–4.0)
MCH: 32.6 pg (ref 26.0–34.0)
MCHC: 34.6 g/dL (ref 30.0–36.0)
MCV: 94.4 fL (ref 80.0–100.0)
Monocytes Absolute: 0.4 10*3/uL (ref 0.1–1.0)
Monocytes Relative: 4 %
Neutro Abs: 7.1 10*3/uL (ref 1.7–7.7)
Neutrophils Relative %: 79 %
Platelets: 229 10*3/uL (ref 150–400)
RBC: 5.36 MIL/uL (ref 4.22–5.81)
RDW: 11.7 % (ref 11.5–15.5)
WBC: 9 10*3/uL (ref 4.0–10.5)
nRBC: 0 % (ref 0.0–0.2)

## 2020-10-08 LAB — URINALYSIS, ROUTINE W REFLEX MICROSCOPIC
Bilirubin Urine: NEGATIVE
Glucose, UA: NEGATIVE mg/dL
Hgb urine dipstick: NEGATIVE
Ketones, ur: 20 mg/dL — AB
Leukocytes,Ua: NEGATIVE
Nitrite: NEGATIVE
Protein, ur: NEGATIVE mg/dL
Specific Gravity, Urine: 1.008 (ref 1.005–1.030)
pH: 8 (ref 5.0–8.0)

## 2020-10-08 LAB — RESPIRATORY PANEL BY RT PCR (FLU A&B, COVID)
Influenza A by PCR: NEGATIVE
Influenza B by PCR: NEGATIVE
SARS Coronavirus 2 by RT PCR: NEGATIVE

## 2020-10-08 LAB — I-STAT VENOUS BLOOD GAS, ED
Acid-Base Excess: 2 mmol/L (ref 0.0–2.0)
Bicarbonate: 24.1 mmol/L (ref 20.0–28.0)
Calcium, Ion: 1.06 mmol/L — ABNORMAL LOW (ref 1.15–1.40)
HCT: 48 % (ref 39.0–52.0)
Hemoglobin: 16.3 g/dL (ref 13.0–17.0)
O2 Saturation: 97 %
Potassium: 4 mmol/L (ref 3.5–5.1)
Sodium: 138 mmol/L (ref 135–145)
TCO2: 25 mmol/L (ref 22–32)
pCO2, Ven: 29.4 mmHg — ABNORMAL LOW (ref 44.0–60.0)
pH, Ven: 7.521 — ABNORMAL HIGH (ref 7.250–7.430)
pO2, Ven: 81 mmHg — ABNORMAL HIGH (ref 32.0–45.0)

## 2020-10-08 LAB — LACTIC ACID, PLASMA: Lactic Acid, Venous: 1 mmol/L (ref 0.5–1.9)

## 2020-10-08 LAB — CBG MONITORING, ED: Glucose-Capillary: 107 mg/dL — ABNORMAL HIGH (ref 70–99)

## 2020-10-08 MED ORDER — MECLIZINE HCL 25 MG PO TABS
12.5000 mg | ORAL_TABLET | Freq: Once | ORAL | Status: AC
  Administered 2020-10-08: 12.5 mg via ORAL
  Filled 2020-10-08: qty 1

## 2020-10-08 MED ORDER — MECLIZINE HCL 25 MG PO TABS
25.0000 mg | ORAL_TABLET | Freq: Once | ORAL | Status: AC
  Administered 2020-10-08: 25 mg via ORAL
  Filled 2020-10-08: qty 1

## 2020-10-08 MED ORDER — LORAZEPAM 1 MG PO TABS
0.5000 mg | ORAL_TABLET | Freq: Once | ORAL | Status: AC
  Administered 2020-10-08: 0.5 mg via ORAL
  Filled 2020-10-08: qty 1

## 2020-10-08 MED ORDER — SODIUM CHLORIDE 0.9 % IV BOLUS
1000.0000 mL | Freq: Once | INTRAVENOUS | Status: AC
  Administered 2020-10-08: 1000 mL via INTRAVENOUS

## 2020-10-08 MED ORDER — SODIUM CHLORIDE 0.9 % IV BOLUS: 1000.0000 mL | Freq: Once | INTRAVENOUS | Status: DC

## 2020-10-08 NOTE — H&P (Signed)
Drew [name] GXQ:[id number] DOB: [birth year] DOA: 10/08/2020     PCP: [name], DO   Outpatient Specialists:     GI Dr.  Hilarie Fredrickson ([facility])    Patient arrived to ER on 10/08/20 at 0848 Referred by Attending No att. providers found   Patient coming from: home Lives  With family  Chief Complaint:  Chief Complaint  Patient presents with  . Dizziness  . Nausea    HPI: Drew [name] is a 64 y.o. male with medical history significant of CVA, hypertension, hyperlipidemia,    Presented with   Lightheadedness, room spinning  N/V and diaphoresis worse with head movement.  Similar presentation to ER 5 days ago No fever, symptoms come and go.  EMS checked orthostatics and was found out to be negative Have not been able to eat since Wednesday  Former smoker quit 20 y ago He was evaluated for the same thing 5 days  64yo hqad similar episode  Occasional ringing in the ears No pain no ear fullness orse if he sits up too fast Infectious risk factors:  Reports  N/V   Has NOt been vaccinated against COVID (refuses)   Initial COVID TEST   in house  PCR testing  Pending  No results found for: SARSCOV2NAA   Regarding pertinent Chronic problems:    Hyperlipidemia -  on statins Pravachol Lipid Panel     Component Value Date/Time   CHOL 155 10/06/2020 1536   TRIG 100 10/06/2020 1536   HDL 48 10/06/2020 1536   CHOLHDL 3.2 10/06/2020 1536   VLDL 14.6 04/21/2020 1537   LDLCALC 87 10/06/2020 1536    HTN on lisinopril/HCTZ   Hx of CVA - with/out residual deficits on Aspirin  325mg   While in ER: Found to have Hg up to 17.5 CT head neg Given IV fluids and Ativert MRI no CVA - pansinus disese     Hospitalist was called for admission for vertigo, dehydration  The following Work up has been ordered so far:  Orders Placed This Encounter  Procedures  . CT Head Wo Contrast  . MR BRAIN WO CONTRAST  . Comprehensive metabolic panel  . CBC with Differential  .  Urinalysis, Routine w reflex microscopic  . Consult to hospitalist  ALL PATIENTS BEING ADMITTED/HAVING PROCEDURES NEED COVID-19 SCREENING  . CBG monitoring, ED  . EKG 12-Lead  . ED EKG   Following Medications were ordered in ER: Medications  sodium chloride 0.9 % bolus 1,000 mL (0 mLs Intravenous Stopped 10/08/20 1141)  meclizine (ANTIVERT) tablet 12.5 mg (12.5 mg Oral Given 10/08/20 1016)  meclizine (ANTIVERT) tablet 25 mg (25 mg Oral Given 10/08/20 1735)  LORazepam (ATIVAN) tablet 0.5 mg (0.5 mg Oral Given 10/08/20 1735)        Consult Orders  (From admission, onward)         Start     Ordered   10/08/20 1930  Consult to hospitalist  ALL PATIENTS BEING ADMITTED/HAVING PROCEDURES NEED COVID-19 SCREENING Paged by Jerene Pitch  Once       Comments: ALL PATIENTS BEING ADMITTED/HAVING PROCEDURES NEED COVID-19 SCREENING  Provider:  (Not yet assigned)  Question Answer Comment  Place call to: [facility]   Reason for Consult Admit      10/08/20 1929          Significant initial  Findings: Abnormal Labs Reviewed  COMPREHENSIVE METABOLIC PANEL - Abnormal; Notable for the following components:      Result Value  CO2 20 (*)    Total Protein 6.4 (*)    Total Bilirubin 2.1 (*)    All other components within normal limits  CBC WITH DIFFERENTIAL/PLATELET - Abnormal; Notable for the following components:   Hemoglobin 17.5 (*)    All other components within normal limits  URINALYSIS, ROUTINE W REFLEX MICROSCOPIC - Abnormal; Notable for the following components:   Ketones, ur 20 (*)    All other components within normal limits  CBG MONITORING, ED - Abnormal; Notable for the following components:   Glucose-Capillary 107 (*)    All other components within normal limits   Otherwise labs showing:    Recent Labs  Lab 10/06/20 1536 10/08/20 1149  NA 139 137  K 4.8 4.2  CO2 31 20*  GLUCOSE 87 83  BUN 9 13  CREATININE 0.91 0.92  CALCIUM 9.7 9.1    Cr  stable, Lab Results    Component Value Date   CREATININE 0.92 10/08/2020   CREATININE 0.91 10/06/2020   CREATININE 0.91 04/21/2020    Recent Labs  Lab 10/06/20 1536 10/08/20 1149  AST 20 37  ALT 16 17  ALKPHOS  --  52  BILITOT 0.5 2.1*  PROT 6.5 6.4*  ALBUMIN  --  3.5   Lab Results  Component Value Date   CALCIUM 9.1 10/08/2020   WBC    Component Value Date/Time   WBC 9.0 10/08/2020 1149   LYMPHSABS 1.3 10/08/2020 1149   MONOABS 0.4 10/08/2020 1149   EOSABS 0.2 10/08/2020 1149   BASOSABS 0.1 10/08/2020 1149   Plt: Lab Results  Component Value Date   PLT 229 10/08/2020    Venous  Blood Gas ordered HG/HCT Up from baseline see below    Component Value Date/Time   HGB 17.5 (H) 10/08/2020 1149   HCT 50.6 10/08/2020 1149   MCV 94.4 10/08/2020 1149     No results for input(s): CKTOTAL, CKMB, TROPONINI, RELINDX in the last 72 hours.     ECG: Ordered Personally reviewed by me showing: HR : 61 Rhythm:  NSR,   no evidence of ischemic changes QTC 433   CBG (last 3)  Recent Labs    10/08/20 0857  GLUCAP 107*    UA  no evidence of UTI   Urine analysis:    Component Value Date/Time   COLORURINE YELLOW 10/08/2020 1149   APPEARANCEUR CLEAR 10/08/2020 1149   LABSPEC 1.008 10/08/2020 1149   PHURINE 8.0 10/08/2020 1149   GLUCOSEU NEGATIVE 10/08/2020 1149   HGBUR NEGATIVE 10/08/2020 [place] 10/08/2020 1149   BILIRUBINUR Neg 01/18/2014 1651   KETONESUR 20 (A) 10/08/2020 1149   PROTEINUR NEGATIVE 10/08/2020 1149   UROBILINOGEN 0.2 01/18/2014 1651   NITRITE NEGATIVE 10/08/2020 1149   LEUKOCYTESUR NEGATIVE 10/08/2020 1149     Ordered  CT HEAD sinus thickening   MRI - no CVA  ED Triage Vitals  Enc Vitals Group     BP 10/08/20 0857 (!) 157/98     Pulse Rate 10/08/20 0857 (!) 57     Resp 10/08/20 0857 18     Temp 10/08/20 0857 97.6 F (36.4 C)     Temp Source 10/08/20 0857 Oral     SpO2 10/08/20 0857 99 %     Weight 10/08/20 0856 150 lb 6.4 oz (68.2 kg)      Height 10/08/20 0856 5\' 7"  (1.702 m)     Head Circumference --      Peak Flow --  Pain Score 10/08/20 0856 0     Pain Loc --      Pain Edu? --      Excl. in [place]? --   TMAX(24)@       Latest  Blood pressure (!) 139/94, pulse 62, temperature 97.6 F (36.4 C), temperature source Oral, resp. rate (!) 21, height 5\' 7"  (1.702 m), weight 68.2 kg, SpO2 95 %.   Review of Systems:    Pertinent positives include: fatigue, vertigo  Constitutional:  No weight loss, night sweats, Fevers, chills weight loss  HEENT:  No headaches, Difficulty swallowing,Tooth/dental problems,Sore throat,  No sneezing, itching, ear ache, nasal congestion, post nasal drip,  Cardio-vascular:  No chest pain, Orthopnea, PND, anasarca, dizziness, palpitations.no Bilateral lower extremity swelling  GI:  No heartburn, indigestion, abdominal pain, nausea, vomiting, diarrhea, change in bowel habits, loss of appetite, melena, blood in stool, hematemesis Resp:  no shortness of breath at rest. No dyspnea on exertion, No excess mucus, no productive cough, No non-productive cough, No coughing up of blood.No change in color of mucus.No wheezing. Skin:  no rash or lesions. No jaundice GU:  no dysuria, change in color of urine, no urgency or frequency. No straining to urinate.  No flank pain.  Musculoskeletal:  No joint pain or no joint swelling. No decreased range of motion. No back pain.  Psych:  No change in mood or affect. No depression or anxiety. No memory loss.  Neuro: no localizing neurological complaints, no tingling, no weakness, no double vision, no gait abnormality, no slurred speech, no confusion  All systems reviewed and apart from [place] all are negative  Past Medical History:   Past Medical History:  Diagnosis Date  . CVA (cerebral vascular accident) ([place])   . High cholesterol   . Hypertension   . Memory loss   . Tubular adenoma of colon      Past Surgical History:  Procedure Laterality Date   . COLONOSCOPY    . HERNIA REPAIR Left 2004  . POLYPECTOMY      Social History:  Ambulatory  independently     reports that he quit smoking about 32 years ago. His smoking use included cigarettes. He has a 15.00 pack-year smoking history. His smokeless tobacco use includes chew. He reports that he does not drink alcohol and does not use drugs. Family History:  Family History  Problem Relation Age of Onset  . Heart disease Mother        Duanne Limerick  . Berenice Primas' disease Mother   . Heart disease Father   . Hyperlipidemia Brother   . Hypertension Brother   . Heart disease Paternal Uncle   . Heart disease Paternal Uncle   . Heart disease Paternal Uncle   . Heart disease Paternal Uncle   . Heart disease Paternal Uncle   . Heart disease Paternal Uncle   . Hypertension Brother   . Breast cancer Sister   . Colon cancer Neg Hx     Allergies: No Known Allergies   Prior to Admission medications   Medication Sig Start Date End Date Taking? Authorizing Provider  aspirin EC 325 MG tablet Take 1 tablet (325 mg total) by mouth daily. 03/21/17  Yes Hongalgi, Lenis Dickinson, MD  Flaxseed, Linseed, (FLAXSEED OIL) 1200 MG CAPS Take 2,400 mg by mouth daily.    Yes [provider]  lisinopril-hydrochlorothiazide (ZESTORETIC) 20-12.5 MG tablet Take 2 tablets by mouth daily. Needs ov Patient taking differently: Take 2 tablets by mouth in the morning.  10/06/20  Yes [name], DO  Omega-3 Fatty Acids (FISH OIL) 1200 MG CAPS Take 1,200 mg by mouth 2 (two) times daily.    Yes [provider]  pravastatin (PRAVACHOL) 40 MG tablet TAKE 1 TABLET(40 MG) BY MOUTH DAILY Patient taking differently: Take 40 mg by mouth daily.  10/04/20  Yes [name], DO   Physical Exam: Vitals with BMI 10/08/2020 10/08/2020 10/08/2020  Height - - -  Weight - - -  BMI - - -  Systolic 601 093 235  Diastolic 94 87 97  Pulse 62 57 59   1. General:  in No  Acute distress    Chronically ill  -appearing 2. Psychological: Alert and Oriented 3. Head/ENT:   Dry Mucous Membranes                          Head Non traumatic, neck supple                         Poor Dentition 4. SKIN: decreased Skin turgor,  Skin clean Dry and intact no rash 5. Heart: Regular rate and rhythm no Murmur, no Rub or gallop 6. Lungs:  no wheezes or crackles   7. Abdomen: Soft,  non-tender,  8. Lower extremities: no clubbing, cyanosis, no edema 9. Neurologically Grossly intact, moving all 4 extremities equally  10. MSK: Normal range of motion   All other LABS:     Recent Labs  Lab 10/08/20 1149  WBC 9.0  NEUTROABS 7.1  HGB 17.5*  HCT 50.6  MCV 94.4  PLT 229     Recent Labs  Lab 10/06/20 1536 10/08/20 1149  NA 139 137  K 4.8 4.2  CL 100 102  CO2 31 20*  GLUCOSE 87 83  BUN 9 13  CREATININE 0.91 0.92  CALCIUM 9.7 9.1     Recent Labs  Lab 10/06/20 1536 10/08/20 1149  AST 20 37  ALT 16 17  ALKPHOS  --  52  BILITOT 0.5 2.1*  PROT 6.5 6.4*  ALBUMIN  --  3.5       Cultures: No results found for: SDES, SPECREQUEST, CULT, REPTSTATUS   Radiological Exams on Admission: CT Head Wo Contrast  Result Date: 10/08/2020 CLINICAL DATA:  Dizziness. EXAM: CT HEAD WITHOUT CONTRAST TECHNIQUE: Contiguous axial images were obtained from the base of the skull through the vertex without intravenous contrast. COMPARISON:  03/18/2017 FINDINGS: Brain: No evidence of acute infarction, hemorrhage, hydrocephalus, extra-axial collection or mass lesion/mass effect. Vascular: No hyperdense vessel or unexpected calcification. Skull: Normal. Negative for fracture or focal lesion. Sinuses/Orbits: Globes and orbits are unremarkable. Findings consistent with previous sinus surgery. Moderate mucosal thickening lines the ethmoid air cells. Mild mucosal thickening lines the remaining sinus cavities. No sinus air-fluid levels. Other: None. IMPRESSION: 1. No intracranial abnormality. 2. Sinus mucosal thickening as  described. No sinus air-fluid levels. Electronically Signed   By: Lajean Manes M.D.   On: 10/08/2020 09:54   MR BRAIN WO CONTRAST  Result Date: 10/08/2020 CLINICAL DATA:  Dizziness, nonspecific. EXAM: MRI HEAD WITHOUT CONTRAST TECHNIQUE: Multiplanar, multiecho pulse sequences of the brain and surrounding structures were obtained without intravenous contrast. COMPARISON:  10/08/2020 head CT and prior.  03/18/2017 MRI/MRA head. FINDINGS: Brain: No diffusion-weighted signal abnormality. No intracranial hemorrhage. No midline shift, ventriculomegaly or extra-axial fluid collection. No mass lesion. Cerebral volume is within normal limits. No significant white matter  disease. Vascular: Proximally preserved major intracranial flow voids. Skull and upper cervical spine: Normal marrow signal. Sinuses/Orbits: Normal orbits. Mild to moderate pansinus disease. Trace right mastoid effusion. Other: None. IMPRESSION: No acute intracranial process. Mild to moderate pansinus disease. Electronically Signed   By: Primitivo Gauze M.D.   On: 10/08/2020 15:37    Chart has been reviewed    Assessment/Plan   64 y.o. male with medical history significant of CVA, hypertension, hyperlipidemia,     Admitted for Dehydration due to severe vertigo and polycythemia  Present on Admission: . Dehydration - will rehydrate, with IV fluids and check orthostatics  . Hyperlipidemia LDL goal <100 - continue home meds  . HTN (hypertension) - hold ACE/HCTZ given  dehydration  . Elevated hemoglobin ([place]) - emailed Oncology pt will likely need follow up set up  . Metabolic acidosis - possibly keto acidosis in the setting of decreased PO intake Will check VBG And lactic acid, rehydrate and encourage PO   Vertigo - supportive management, MRI brain WNL, rehydrate, atarax PRN MRi showing sinus disease may benefir from outpat ENT consult   Other plan as per orders.  DVT prophylaxis:    Lovenox     Code Status:    Code  Status: Prior FULL CODE as per patient  I had personally discussed CODE STATUS with patient    Family Communication:   Family not at  Bedside   Disposition Plan:         To home once workup is complete and patient is stable  Following barriers for discharge:                          reanl function improving                                                        Will need to be able to tolerate PO                   Would benefit from PT/OT eval prior to DC  Ordered                                      Consults called: emailed Oncology/ hematology    Admission status:  ED Disposition    ED Disposition Condition Desha: [place] [[id number]]  Level of Care: Med-Surg [16]  I expect the patient will be discharged within 24 hours: No (not a candidate for 5C-Observation unit)  Covid Evaluation: Symptomatic Person Under Investigation (PUI)  Diagnosis: Dehydration [276.51.ICD-9-CM]  Admitting Physician: Toy Baker [[id number]]  Attending Physician: Toy Baker [[id number]]        Obs    Level of care  medical floor     No results found for: SARSCOV2NAA   Precautions: admitted a PUI       PPE: Used by the provider:    P100  eye Goggles,  Gloves   Janelle Spellman 10/08/2020, 9:19 PM    [facility]     after 2 AM please page floor coverage PA If 7AM-7PM, please contact the day team taking care of the patient using [email or web address]   Patient was evaluated in  the context of the global COVID-19 pandemic, which necessitated consideration that the patient might be at risk for infection with the SARS-CoV-2 virus that causes COVID-19. Institutional protocols and algorithms that pertain to the evaluation of patients at risk for COVID-19 are in a state of rapid change based on information released by regulatory bodies including the CDC and federal and state organizations. These policies and algorithms were followed during the  patient's care.

## 2020-10-08 NOTE — ED Provider Notes (Signed)
[place] EMERGENCY DEPARTMENT Provider Note   CSN: [id number] Arrival date & time: 10/08/20  0848     History Chief Complaint  Patient presents with  . Dizziness  . Nausea    Drew [name] is a 64 y.o. male.  HPI   Patient is a 64 year old male with a history of CVA, hypertension, hyperlipidemia, who presents to the emergency department due to dizziness.  Patient reports associated nausea and vomiting as well as diaphoresis.  Feels that his symptoms seem to worsen with head movement.  He was evaluated 5 days ago in the emergency department for this and was found to have a benign work-up, though no imaging was performed at that time.  States that his symptoms persisted.  States that they were happening intermittently throughout the day yesterday and had little p.o. intake yesterday due to this.  No fevers, chills, chest pain, shortness of breath, abdominal pain, leg swelling, calf pain.  Orthostatics were obtained by EMS which were negative.      Past Medical History:  Diagnosis Date  . CVA (cerebral vascular accident) ([place])   . High cholesterol   . Hypertension   . Memory loss   . Tubular adenoma of colon     Patient Active Problem List   Diagnosis Date Noted  . Preventative health care 04/22/2020  . Ischemic stroke ([place]) 03/18/2017  . Hyperlipidemia LDL goal <100 07/19/2016  . HTN (hypertension) 01/18/2014    Past Surgical History:  Procedure Laterality Date  . COLONOSCOPY    . HERNIA REPAIR Left 2004  . POLYPECTOMY         Family History  Problem Relation Age of Onset  . Heart disease Mother        Duanne Limerick  . Berenice Primas' disease Mother   . Heart disease Father   . Hyperlipidemia Brother   . Hypertension Brother   . Heart disease Paternal Uncle   . Heart disease Paternal Uncle   . Heart disease Paternal Uncle   . Heart disease Paternal Uncle   . Heart disease Paternal Uncle   . Heart disease Paternal Uncle   . Hypertension Brother   .  Breast cancer Sister   . Colon cancer Neg Hx     Social History   Tobacco Use  . Smoking status: Former Smoker    Packs/day: 1.00    Years: 15.00    Pack years: 15.00    Types: Cigarettes    Quit date: 01/19/1988    Years since quitting: 32.7  . Smokeless tobacco: Current User    Types: Chew  Substance Use Topics  . Alcohol use: No  . Drug use: No    Home Medications Prior to Admission medications   Medication Sig Start Date End Date Taking? Authorizing Provider  aspirin EC 325 MG tablet Take 1 tablet (325 mg total) by mouth daily. 03/21/17   Hongalgi, Lenis Dickinson, MD  Flaxseed, Linseed, (FLAXSEED OIL) 1200 MG CAPS Take 2 capsules by mouth daily.    [provider]  lisinopril-hydrochlorothiazide (ZESTORETIC) 20-12.5 MG tablet Take 2 tablets by mouth daily. Needs ov 10/06/20   [name], DO  Omega-3 Fatty Acids (FISH OIL) 1200 MG CAPS Take by mouth daily.    [provider]  pravastatin (PRAVACHOL) 40 MG tablet TAKE 1 TABLET(40 MG) BY MOUTH DAILY 10/04/20   [name], DO    Allergies    Patient has no known allergies.  Review of Systems  Review of Systems  All other systems reviewed and are negative. Ten systems reviewed and are negative for acute change, except as noted in the HPI.    Physical Exam Updated Vital Signs BP (!) 157/98 (BP Location: Left Arm)   Pulse (!) 57   Temp 97.6 F (36.4 C) (Oral)   Resp 18   Ht 5\' 7"  (1.702 m)   Wt 68.2 kg   SpO2 99%   BMI 23.56 kg/m   Physical Exam Vitals and nursing note reviewed.  Constitutional:      General: He is not in acute distress.    Appearance: Normal appearance. He is normal weight. He is not ill-appearing, toxic-appearing or diaphoretic.  HENT:     Head: Normocephalic and atraumatic.     Right Ear: External ear normal.     Left Ear: External ear normal.     Nose: Nose normal.     Mouth/Throat:     Mouth: Mucous membranes are moist.     Pharynx: Oropharynx is clear.  No oropharyngeal exudate or posterior oropharyngeal erythema.  Eyes:     General: No scleral icterus.       Right eye: No discharge.        Left eye: No discharge.     Extraocular Movements: Extraocular movements intact.     Conjunctiva/sclera: Conjunctivae normal.  Cardiovascular:     Rate and Rhythm: Normal rate and regular rhythm.     Pulses: Normal pulses.     Heart sounds: Normal heart sounds. No murmur heard.  No friction rub. No gallop.      Comments: Mildly bradycardic in the mid to high 50s.  Regular rhythm.  No murmurs, rubs, gallops. Pulmonary:     Effort: Pulmonary effort is normal. No respiratory distress.     Breath sounds: Normal breath sounds. No stridor. No wheezing, rhonchi or rales.     Comments: Lungs are clear to auscultation bilaterally. Abdominal:     General: Abdomen is flat.     Tenderness: There is no abdominal tenderness.     Comments: Abdomen is soft and nontender.  Musculoskeletal:        General: Normal range of motion.     Cervical back: Normal range of motion and neck supple. No tenderness.  Skin:    General: Skin is warm and dry.  Neurological:     General: No focal deficit present.     Mental Status: He is alert and oriented to person, place, and time.     Comments: Patient is oriented to person, place, and time. Patient phonates in clear, complete, and coherent sentences. Negative arm drift. Finger to nose intact bilaterally with no visible signs of dysmetria. Strength is 5/5 in all four extremities. Distal sensation intact in all four extremities.   Psychiatric:        Mood and Affect: Mood normal.        Behavior: Behavior normal.    ED Results / Procedures / Treatments   Labs (all labs ordered are listed, but only abnormal results are displayed) Labs Reviewed  COMPREHENSIVE METABOLIC PANEL - Abnormal; Notable for the following components:      Result Value   CO2 20 (*)    Total Protein 6.4 (*)    Total Bilirubin 2.1 (*)    All other  components within normal limits  CBC WITH DIFFERENTIAL/PLATELET - Abnormal; Notable for the following components:   Hemoglobin 17.5 (*)    All other components within normal limits  URINALYSIS, ROUTINE W REFLEX MICROSCOPIC - Abnormal; Notable for the following components:   Ketones, ur 20 (*)    All other components within normal limits  CBG MONITORING, ED - Abnormal; Notable for the following components:   Glucose-Capillary 107 (*)    All other components within normal limits    EKG EKG Interpretation  Date/Time:  Saturday October 08 2020 08:54:05 EDT Ventricular Rate:  61 PR Interval:    QRS Duration: 102 QT Interval:  429 QTC Calculation: 433 R Axis:   69 Text Interpretation: Sinus rhythm Probable left atrial enlargement RSR' in V1 or V2, right VCD or RVH Confirmed by Dene Gentry [id number]) on 10/08/2020 9:00:05 AM   Radiology CT Head Wo Contrast  Result Date: 10/08/2020 CLINICAL DATA:  Dizziness. EXAM: CT HEAD WITHOUT CONTRAST TECHNIQUE: Contiguous axial images were obtained from the base of the skull through the vertex without intravenous contrast. COMPARISON:  03/18/2017 FINDINGS: Brain: No evidence of acute infarction, hemorrhage, hydrocephalus, extra-axial collection or mass lesion/mass effect. Vascular: No hyperdense vessel or unexpected calcification. Skull: Normal. Negative for fracture or focal lesion. Sinuses/Orbits: Globes and orbits are unremarkable. Findings consistent with previous sinus surgery. Moderate mucosal thickening lines the ethmoid air cells. Mild mucosal thickening lines the remaining sinus cavities. No sinus air-fluid levels. Other: None. IMPRESSION: 1. No intracranial abnormality. 2. Sinus mucosal thickening as described. No sinus air-fluid levels. Electronically Signed   By: Lajean Manes M.D.   On: 10/08/2020 09:54   MR BRAIN WO CONTRAST  Result Date: 10/08/2020 CLINICAL DATA:  Dizziness, nonspecific. EXAM: MRI HEAD WITHOUT CONTRAST TECHNIQUE:  Multiplanar, multiecho pulse sequences of the brain and surrounding structures were obtained without intravenous contrast. COMPARISON:  10/08/2020 head CT and prior.  03/18/2017 MRI/MRA head. FINDINGS: Brain: No diffusion-weighted signal abnormality. No intracranial hemorrhage. No midline shift, ventriculomegaly or extra-axial fluid collection. No mass lesion. Cerebral volume is within normal limits. No significant white matter disease. Vascular: Proximally preserved major intracranial flow voids. Skull and upper cervical spine: Normal marrow signal. Sinuses/Orbits: Normal orbits. Mild to moderate pansinus disease. Trace right mastoid effusion. Other: None. IMPRESSION: No acute intracranial process. Mild to moderate pansinus disease. Electronically Signed   By: Primitivo Gauze M.D.   On: 10/08/2020 15:37    Procedures Procedures (including critical care time)  Medications Ordered in ED Medications  sodium chloride 0.9 % bolus 1,000 mL (0 mLs Intravenous Stopped 10/08/20 1141)  meclizine (ANTIVERT) tablet 12.5 mg (12.5 mg Oral Given 10/08/20 1016)   ED Course  I have reviewed the triage vital signs and the nursing notes.  Pertinent labs & imaging results that were available during my care of the patient were reviewed by me and considered in my medical decision making (see chart for details).  Clinical Course as of Oct 08 1548  Sat Oct 08, 2020  1019 1. No intracranial abnormality. 2. Sinus mucosal thickening as described. No sinus air-fluid levels.   CT Head Wo Contrast [LJ]  1133 Likely secondary to hemoconcentration  Hemoglobin(!): 17.5 [LJ]    Clinical Course User Index [LJ] Rayna Sexton, PA-C   MDM Rules/Calculators/A&P                          Pt is a 64 y.o. male that presents with a history, physical exam, and ED Clinical Course as noted above.   Patient presents today with dizziness, diaphoresis, nausea, vomiting.  This has been intermittent for the past couple of days.   He  was worked up for this 5 days ago and had a reassuring work-up and was discharged home.  States his symptoms persisted.  Patient had worsening dizziness with any head movement on my exam.  Particularly worse when going from a supine to an upright position.  Otherwise, neurological exam benign.  CT scan was obtained of the head which was reassuring.  Basic labs reassuring.  Given his history of CVA obtained an MRI of the brain.    Patient given a liter of IV fluids as well as Antivert with mild improvement.  MRI pending.  It is the end of my shift and patient care will be transferred to [facility].  If MRI negative and patient can ambulate, feel that patient is safe for discharge with short course of Antivert and possibly antiemetics.  If positive, consult with neurology.  Note: Portions of this report may have been transcribed using voice recognition software. Every effort was made to ensure accuracy; however, inadvertent computerized transcription errors may be present.   Final Clinical Impression(s) / ED Diagnoses Final diagnoses:  Dizziness   Rx / DC Orders ED Discharge Orders    None       Rayna Sexton, PA-C 10/08/20 1549    Valarie Merino, MD 10/10/20 2024

## 2020-10-08 NOTE — ED Provider Notes (Signed)
Care of the patient was assumed from L. Joldersma PA-C at 54; see this 37 note for complete history of present illness, review of systems, and physical exam.  Briefly, the patient is a 63 y.o. male who presented to the ED with vertigo. History significant for CVA.  MRI negative for CVA.  Blood work negative.  Plan at time of handoff:  Reassess, will need admission if patient still has vertiginous symptoms.       Physical Exam  BP (!) 139/94   Pulse 62   Temp 97.6 F (36.4 C) (Oral)   Resp (!) 21   Ht 5\' 7"  (1.702 m)   Wt 68.2 kg   SpO2 95%   BMI 23.56 kg/m   Physical Exam Constitutional:      General: He is not in acute distress.    Appearance: Normal appearance. He is not ill-appearing, toxic-appearing or diaphoretic.  Cardiovascular:     Rate and Rhythm: Normal rate.     Pulses: Normal pulses.  Pulmonary:     Effort: Pulmonary effort is normal.  Musculoskeletal:        General: Normal range of motion.  Skin:    General: Skin is warm and dry.     Capillary Refill: Capillary refill takes less than 2 seconds.  Neurological:     General: No focal deficit present.     Mental Status: He is alert and oriented to person, place, and time.     Comments: Alert. Clear speech. No facial droop. CNIII-XII grossly intact. Bilateral upper and lower extremities' sensation grossly intact. 5/5 symmetric strength with grip strength and with plantar and dorsi flexion bilaterally. P. Normal finger to nose bilaterally. Negative pronator drift. Gait unsteady  Psychiatric:        Mood and Affect: Mood normal.        Behavior: Behavior normal.        Thought Content: Thought content normal.     ED Course/Procedures   Clinical Course as of Oct 08 1499  Sat Oct 08, 2020  1019 1. No intracranial abnormality. 2. Sinus mucosal thickening as described. No sinus air-fluid levels.   CT Head Wo Contrast [LJ]  1133 Likely secondary to hemoconcentration  Hemoglobin(!): 17.5 [LJ]     Clinical Course User Index [LJ] Rayna Sexton, PA-C    Procedures  SAY    301 reassessed patient, patient still vertiginous when he sits up.  Was unable to walk at this time, did discuss option for coming in and observation.  Patient states that he wants to try meclizine 1 more time and if he still feels vertiginous then he will come in.  Ordered 25 mg of meclizine and Ativan at this time.  Will reassess in 40 minutes.  Upon reassessment patient states that he feels better, attempted standing up and patient got really dizzy again with vertiginous symptoms.  States that he cannot walk however he wants to try again.  Had nurse tech walk him, states that he was dizzy the entire time, does not feel steady enough to walk. We will admit him to the hospitalist at this time for observation.  Patient agreeable with this.  819 spoke to Dr. Roel Cluck who will admit the patient.  The patient appears reasonably stabilized for admission considering the current resources, flow, and capabilities available in the ED at this time, and I doubt any other [facility] requiring further screening and/or treatment in the ED prior to admission.  Alfredia Client, PA-C 10/08/20 2029    Wyvonnia Dusky, MD 10/09/20 [id number]

## 2020-10-08 NOTE — ED Notes (Signed)
CBG collected. Result "107." RN, Delsa Sale, notified.

## 2020-10-08 NOTE — ED Triage Notes (Signed)
Pt arrives EMS from home with complaints of dizziness, N/V and diaphoresis. Per EMS pt woke up this am around 0400-0430 sweating, nauseous and had one episode of vomiting.  Pt alert and oriented X4  EMS gave 4mg  zofran

## 2020-10-09 ENCOUNTER — Encounter (HOSPITAL_COMMUNITY): Payer: Self-pay | Admitting: Internal Medicine

## 2020-10-09 ENCOUNTER — Other Ambulatory Visit: Payer: Self-pay

## 2020-10-09 DIAGNOSIS — R42 Dizziness and giddiness: Secondary | ICD-10-CM | POA: Diagnosis not present

## 2020-10-09 DIAGNOSIS — Z7982 Long term (current) use of aspirin: Secondary | ICD-10-CM | POA: Diagnosis not present

## 2020-10-09 DIAGNOSIS — Z803 Family history of malignant neoplasm of breast: Secondary | ICD-10-CM | POA: Diagnosis not present

## 2020-10-09 DIAGNOSIS — Z8673 Personal history of transient ischemic attack (TIA), and cerebral infarction without residual deficits: Secondary | ICD-10-CM | POA: Diagnosis not present

## 2020-10-09 DIAGNOSIS — H8110 Benign paroxysmal vertigo, unspecified ear: Secondary | ICD-10-CM | POA: Diagnosis present

## 2020-10-09 DIAGNOSIS — I10 Essential (primary) hypertension: Secondary | ICD-10-CM | POA: Diagnosis present

## 2020-10-09 DIAGNOSIS — E785 Hyperlipidemia, unspecified: Secondary | ICD-10-CM | POA: Diagnosis present

## 2020-10-09 DIAGNOSIS — Z20822 Contact with and (suspected) exposure to covid-19: Secondary | ICD-10-CM | POA: Diagnosis present

## 2020-10-09 DIAGNOSIS — Z720 Tobacco use: Secondary | ICD-10-CM | POA: Diagnosis not present

## 2020-10-09 DIAGNOSIS — E872 Acidosis: Secondary | ICD-10-CM | POA: Diagnosis present

## 2020-10-09 DIAGNOSIS — D751 Secondary polycythemia: Secondary | ICD-10-CM | POA: Diagnosis present

## 2020-10-09 DIAGNOSIS — Z79899 Other long term (current) drug therapy: Secondary | ICD-10-CM | POA: Diagnosis not present

## 2020-10-09 DIAGNOSIS — Z8249 Family history of ischemic heart disease and other diseases of the circulatory system: Secondary | ICD-10-CM | POA: Diagnosis not present

## 2020-10-09 DIAGNOSIS — E860 Dehydration: Secondary | ICD-10-CM | POA: Diagnosis present

## 2020-10-09 DIAGNOSIS — Z83438 Family history of other disorder of lipoprotein metabolism and other lipidemia: Secondary | ICD-10-CM | POA: Diagnosis not present

## 2020-10-09 LAB — LIPASE, BLOOD: Lipase: 28 U/L (ref 11–51)

## 2020-10-09 LAB — CBC WITH DIFFERENTIAL/PLATELET
Abs Immature Granulocytes: 0.03 10*3/uL (ref 0.00–0.07)
Basophils Absolute: 0.1 10*3/uL (ref 0.0–0.1)
Basophils Relative: 1 %
Eosinophils Absolute: 0.4 10*3/uL (ref 0.0–0.5)
Eosinophils Relative: 5 %
HCT: 49 % (ref 39.0–52.0)
Hemoglobin: 16.8 g/dL (ref 13.0–17.0)
Immature Granulocytes: 0 %
Lymphocytes Relative: 16 %
Lymphs Abs: 1.5 10*3/uL (ref 0.7–4.0)
MCH: 32.6 pg (ref 26.0–34.0)
MCHC: 34.3 g/dL (ref 30.0–36.0)
MCV: 95 fL (ref 80.0–100.0)
Monocytes Absolute: 0.6 10*3/uL (ref 0.1–1.0)
Monocytes Relative: 6 %
Neutro Abs: 6.6 10*3/uL (ref 1.7–7.7)
Neutrophils Relative %: 72 %
Platelets: 191 10*3/uL (ref 150–400)
RBC: 5.16 MIL/uL (ref 4.22–5.81)
RDW: 11.7 % (ref 11.5–15.5)
WBC: 9.3 10*3/uL (ref 4.0–10.5)
nRBC: 0 % (ref 0.0–0.2)

## 2020-10-09 LAB — TSH: TSH: 0.967 u[IU]/mL (ref 0.350–4.500)

## 2020-10-09 LAB — VITAMIN B12: Vitamin B-12: 371 pg/mL (ref 180–914)

## 2020-10-09 LAB — HIV ANTIBODY (ROUTINE TESTING W REFLEX): HIV Screen 4th Generation wRfx: NONREACTIVE

## 2020-10-09 LAB — MAGNESIUM
Magnesium: 2.1 mg/dL (ref 1.7–2.4)
Magnesium: 1.9 mg/dL (ref 1.7–2.4)

## 2020-10-09 LAB — CK: Total CK: 79 U/L (ref 49–397)

## 2020-10-09 LAB — PHOSPHORUS: Phosphorus: 3.3 mg/dL (ref 2.5–4.6)

## 2020-10-09 MED ORDER — SODIUM CHLORIDE 0.9 % IV SOLN: INTRAVENOUS | Status: DC

## 2020-10-09 MED ORDER — DM-GUAIFENESIN ER 30-600 MG PO TB12: 1.0000 | ORAL_TABLET | Freq: Two times a day (BID) | ORAL | Status: DC | PRN

## 2020-10-09 MED ORDER — ASPIRIN EC 325 MG PO TBEC
325.0000 mg | DELAYED_RELEASE_TABLET | Freq: Every day | ORAL | Status: DC
  Administered 2020-10-09 – 2020-10-10 (×2): 325 mg via ORAL
  Filled 2020-10-09 (×2): qty 1

## 2020-10-09 MED ORDER — PRAVASTATIN SODIUM 40 MG PO TABS
40.0000 mg | ORAL_TABLET | Freq: Every day | ORAL | Status: DC
  Administered 2020-10-09 – 2020-10-10 (×2): 40 mg via ORAL
  Filled 2020-10-09 (×2): qty 1

## 2020-10-09 MED ORDER — ACETAMINOPHEN 325 MG PO TABS: 650.0000 mg | ORAL_TABLET | Freq: Four times a day (QID) | ORAL | Status: DC | PRN

## 2020-10-09 MED ORDER — ENOXAPARIN SODIUM 40 MG/0.4ML ~~LOC~~ SOLN
40.0000 mg | SUBCUTANEOUS | Status: DC
  Administered 2020-10-09: 40 mg via SUBCUTANEOUS
  Filled 2020-10-09: qty 0.4

## 2020-10-09 MED ORDER — ENOXAPARIN SODIUM 30 MG/0.3ML ~~LOC~~ SOLN: 30.0000 mg | SUBCUTANEOUS | Status: DC

## 2020-10-09 MED ORDER — SENNOSIDES-DOCUSATE SODIUM 8.6-50 MG PO TABS: 1.0000 | ORAL_TABLET | Freq: Every evening | ORAL | Status: DC | PRN

## 2020-10-09 MED ORDER — ACETAMINOPHEN 650 MG RE SUPP: 650.0000 mg | Freq: Four times a day (QID) | RECTAL | Status: DC | PRN

## 2020-10-09 MED ORDER — HYDROCODONE-ACETAMINOPHEN 5-325 MG PO TABS: 1.0000 | ORAL_TABLET | ORAL | Status: DC | PRN

## 2020-10-09 MED ORDER — MECLIZINE HCL 12.5 MG PO TABS: 25.0000 mg | ORAL_TABLET | Freq: Three times a day (TID) | ORAL | Status: DC | PRN

## 2020-10-09 NOTE — Assessment & Plan Note (Signed)
Well controlled, no changes to meds. Encouraged heart healthy diet such as the DASH diet and exercise as tolerated.  °

## 2020-10-09 NOTE — Assessment & Plan Note (Signed)
Encouraged heart healthy diet, increase exercise, avoid trans fats, consider a krill oil cap daily 

## 2020-10-09 NOTE — Progress Notes (Signed)
PROGRESS NOTE    SUPREME RYBARCZYK  OJJ:[id number] DOB: 1956-11-01 DOA: 10/08/2020 PCP: [name], DO   Brief Narrative:  64 year old with history of CVA, HTN, hyperlipidemia admitted for acute onset of lightheadedness dizziness ongoing for 5 days.  Went to the ER a few days ago, was hydrated and sent home.  His work-up in the hospital including CT head and MRI brain is negative.  Does have some paraspinous disease.   Assessment & Plan:   Active Problems:   HTN (hypertension)   Hyperlipidemia LDL goal <100   Dehydration   Elevated hemoglobin (HCC)   Vertigo   Metabolic acidosis  Dizziness/vertigo, BPPV? -Denies any recent upper respiratory tract infection.  MRI, CT head negative -Continue IV fluids -PT/OT working with the patient including vestibular team -We can do trial of meclizine and benzodiazepine once evaluated by vestibular team -TSH normal -Check B12 and folate  Essential hypertension -On home hydrochlorothiazide and lisinopril currently on hold  Hyperlipidemia -Pravastatin  History of CVA -On aspirin and statin    DVT prophylaxis: enoxaparin (LOVENOX) injection 40 mg Start: 10/09/20 1400  Code Status: Full code Family Communication:     Dispo: The patient is from: Home              Anticipated d/c is to: Home              Anticipated d/c date is: 1 day              Patient currently is not medically stable to d/c.  Patient still feeling quite dizzy, unsafe for discharge       Body mass index is 23.55 kg/m.    Subjective: While participating in physical therapy even at home steady gait and frequent episodes of dizziness especially while turning his head.  After sitting down he felt better but still has dizziness with head movement.  Denies having similar symptoms in the past.  Started about 5 days ago.  Denies any recent URI  Review of Systems Otherwise negative except as per HPI, including: General: Denies fever, chills, night sweats  or unintended weight loss. Resp: Denies cough, wheezing, shortness of breath. Cardiac: Denies chest pain, palpitations, orthopnea, paroxysmal nocturnal dyspnea. GI: Denies abdominal pain, nausea, vomiting, diarrhea or constipation GU: Denies dysuria, frequency, hesitancy or incontinence MS: Denies muscle aches, joint pain or swelling Neuro: Denies headache, neurologic deficits (focal weakness, numbness, tingling), abnormal gait Psych: Denies anxiety, depression, SI/HI/AVH Skin: Denies new rashes or lesions ID: Denies sick contacts, exotic exposures, travel  Examination:  General exam: Appears calm and comfortable  Respiratory system: Clear to auscultation. Respiratory effort normal. Cardiovascular system: S1 & S2 heard, RRR. No JVD, murmurs, rubs, gallops or clicks. No pedal edema. Gastrointestinal system: Abdomen is nondistended, soft and nontender. No organomegaly or masses felt. Normal bowel sounds heard. Central nervous system: Alert and oriented. No focal neurological deficits. Extremities: Symmetric 5 x 5 power. Skin: No rashes, lesions or ulcers Psychiatry: Judgement and insight appear normal. Mood & affect appropriate.     Objective: Vitals:   10/09/20 0230 10/09/20 0245 10/09/20 0400 10/09/20 0906  BP: 129/81 132/89 140/86   Pulse: (!) 52 63 66   Resp: 11 19 18    Temp:   98 F (36.7 C) 97.8 F (36.6 C)  TempSrc:   Oral Oral  SpO2: 95% 91% 97%   Weight:   68.2 kg   Height:   5\' 7"  (1.702 m)     Intake/Output Summary (Last  24 hours) at 10/09/2020 1112 Last data filed at 10/09/2020 0600 Gross per 24 hour  Intake 1275 ml  Output --  Net 1275 ml   Filed Weights   10/08/20 0856 10/09/20 0400  Weight: 68.2 kg 68.2 kg     Data Reviewed:   CBC: Recent Labs  Lab 10/08/20 1149 10/08/20 2134 10/09/20 0436  WBC 9.0  --  9.3  NEUTROABS 7.1  --  6.6  HGB 17.5* 16.3 16.8  HCT 50.6 48.0 49.0  MCV 94.4  --  95.0  PLT 229  --  630   Basic Metabolic  Panel: Recent Labs  Lab 10/06/20 1536 10/08/20 1149 10/08/20 2038 10/08/20 2134 10/09/20 0436  NA 139 137  --  138  --   K 4.8 4.2  --  4.0  --   CL 100 102  --   --   --   CO2 31 20*  --   --   --   GLUCOSE 87 83  --   --   --   BUN 9 13  --   --   --   CREATININE 0.91 0.92  --   --   --   CALCIUM 9.7 9.1  --   --   --   MG  --   --  2.1  --  1.9  PHOS  --   --   --   --  3.3   GFR: Estimated Creatinine Clearance: 74.8 mL/min (by C-G formula based on SCr of 0.92 mg/dL). Liver Function Tests: Recent Labs  Lab 10/06/20 1536 10/08/20 1149  AST 20 37  ALT 16 17  ALKPHOS  --  52  BILITOT 0.5 2.1*  PROT 6.5 6.4*  ALBUMIN  --  3.5   Recent Labs  Lab 10/08/20 2038  LIPASE 28   No results for input(s): AMMONIA in the last 168 hours. Coagulation Profile: No results for input(s): INR, PROTIME in the last 168 hours. Cardiac Enzymes: Recent Labs  Lab 10/08/20 2038  CKTOTAL 79   BNP (last 3 results) No results for input(s): PROBNP in the last 8760 hours. HbA1C: Recent Labs    10/06/20 1536  HGBA1C 5.4   CBG: Recent Labs  Lab 10/08/20 0857  GLUCAP 107*   Lipid Profile: Recent Labs    10/06/20 1536  CHOL 155  HDL 48  LDLCALC 87  TRIG 100  CHOLHDL 3.2   Thyroid Function Tests: Recent Labs    10/09/20 0436  TSH 0.967   Anemia Panel: No results for input(s): VITAMINB12, FOLATE, FERRITIN, TIBC, IRON, RETICCTPCT in the last 72 hours. Sepsis Labs: Recent Labs  Lab 10/08/20 2127  LATICACIDVEN 1.0    Recent Results (from the past 240 hour(s))  Respiratory Panel by RT PCR (Flu A&B, Covid) - Nasopharyngeal Swab     Status: None   Collection Time: 10/08/20  9:17 PM   Specimen: Nasopharyngeal Swab  Result Value Ref Range Status   SARS Coronavirus 2 by RT PCR NEGATIVE NEGATIVE Final    Comment: (NOTE) SARS-CoV-2 target nucleic acids are NOT DETECTED.  The SARS-CoV-2 RNA is generally detectable in upper respiratoy specimens during the acute phase of  infection. The lowest concentration of SARS-CoV-2 viral copies this assay can detect is 131 copies/mL. A negative result does not preclude SARS-Cov-2 infection and should not be used as the sole basis for treatment or other patient management decisions. A negative result may occur with  improper specimen collection/handling, submission of specimen other  than nasopharyngeal swab, presence of viral mutation(s) within the areas targeted by this assay, and inadequate number of viral copies (<131 copies/mL). A negative result must be combined with clinical observations, patient history, and epidemiological information. The expected result is Negative.  Fact Sheet for Patients:  PinkCheek.be  Fact Sheet for Healthcare Providers:  GravelBags.it  This test is no t yet approved or cleared by the [place] FDA and  has been authorized for detection and/or diagnosis of SARS-CoV-2 by FDA under an Emergency Use Authorization (EUA). This EUA will remain  in effect (meaning this test can be used) for the duration of the COVID-19 declaration under Section 564(b)(1) of the Act, 21 U.S.C. section 360bbb-3(b)(1), unless the authorization is terminated or revoked sooner.     Influenza A by PCR NEGATIVE NEGATIVE Final   Influenza B by PCR NEGATIVE NEGATIVE Final    Comment: (NOTE) The Xpert Xpress SARS-CoV-2/FLU/RSV assay is intended as an aid in  the diagnosis of influenza from Nasopharyngeal swab specimens and  should not be used as a sole basis for treatment. Nasal washings and  aspirates are unacceptable for Xpert Xpress SARS-CoV-2/FLU/RSV  testing.  Fact Sheet for Patients: PinkCheek.be  Fact Sheet for Healthcare Providers: GravelBags.it  This test is not yet approved or cleared by the [place] FDA and  has been authorized for detection and/or diagnosis of SARS-CoV-2  by  FDA under an Emergency Use Authorization (EUA). This EUA will remain  in effect (meaning this test can be used) for the duration of the  Covid-19 declaration under Section 564(b)(1) of the Act, 21  U.S.C. section 360bbb-3(b)(1), unless the authorization is  terminated or revoked. Performed at [facility], [place] [address] [place], [place] [address]          Radiology Studies: CT Head Wo Contrast  Result Date: 10/08/2020 CLINICAL DATA:  Dizziness. EXAM: CT HEAD WITHOUT CONTRAST TECHNIQUE: Contiguous axial images were obtained from the base of the skull through the vertex without intravenous contrast. COMPARISON:  03/18/2017 FINDINGS: Brain: No evidence of acute infarction, hemorrhage, hydrocephalus, extra-axial collection or mass lesion/mass effect. Vascular: No hyperdense vessel or unexpected calcification. Skull: Normal. Negative for fracture or focal lesion. Sinuses/Orbits: Globes and orbits are unremarkable. Findings consistent with previous sinus surgery. Moderate mucosal thickening lines the ethmoid air cells. Mild mucosal thickening lines the remaining sinus cavities. No sinus air-fluid levels. Other: None. IMPRESSION: 1. No intracranial abnormality. 2. Sinus mucosal thickening as described. No sinus air-fluid levels. Electronically Signed   By: Lajean Manes M.D.   On: 10/08/2020 09:54   MR BRAIN WO CONTRAST  Result Date: 10/08/2020 CLINICAL DATA:  Dizziness, nonspecific. EXAM: MRI HEAD WITHOUT CONTRAST TECHNIQUE: Multiplanar, multiecho pulse sequences of the brain and surrounding structures were obtained without intravenous contrast. COMPARISON:  10/08/2020 head CT and prior.  03/18/2017 MRI/MRA head. FINDINGS: Brain: No diffusion-weighted signal abnormality. No intracranial hemorrhage. No midline shift, ventriculomegaly or extra-axial fluid collection. No mass lesion. Cerebral volume is within normal limits. No significant white matter disease. Vascular: Proximally  preserved major intracranial flow voids. Skull and upper cervical spine: Normal marrow signal. Sinuses/Orbits: Normal orbits. Mild to moderate pansinus disease. Trace right mastoid effusion. Other: None. IMPRESSION: No acute intracranial process. Mild to moderate pansinus disease. Electronically Signed   By: Primitivo Gauze M.D.   On: 10/08/2020 15:37        Scheduled Meds: . aspirin EC  325 mg Oral Daily  . enoxaparin (LOVENOX) injection  40 mg Subcutaneous Q24H  .  pravastatin  40 mg Oral Daily   Continuous Infusions: . sodium chloride 100 mL/hr at 10/09/20 0315     LOS: 0 days   Time spent= 35 mins    Alacia Rehmann Arsenio Loader, MD [facility]  If 7PM-7AM, please contact night-coverage  10/09/2020, 11:12 AM

## 2020-10-09 NOTE — Progress Notes (Signed)
Physical Therapy Treatment Patient Details Name: Drew [name] MRN: [id number] DOB: 1956/04/28 Today's Date: 10/09/2020    History of Present Illness 64 yo admitted with dizziness worse with head movement with MRI (-). PMhx: CVA, HTN, HLD    PT Comments    Patient seen in follow-up for vestibular assessment. Patient recalled feeling worst when earlier therapists did Hallpike-Dix (per his description) therefore reassessed bil Hallpike-Dix which were negative. Patient with + left cupulolithiasis (lesser symptoms, nystagmus with left supine head roll compared to rt supine head roll). Treated for left cupulolithiasis with pt reporting significant reduction in symptoms with sit to stand and turning head to his left. Educated pt throughout session on BPPV and rationale for testing and treatment procedures. Educated that his symptoms should continue to improve for 1-2 hours after treatment as his vestibular system "calms" from eval and treatment. Will reassess 10/10/20 morning for ?discharge home.     Follow Up Recommendations  Outpatient PT;Supervision for mobility/OOB (vestibular rehab)     Equipment Recommendations  Rolling walker with 5" wheels    Recommendations for Other Services       Precautions / Restrictions Precautions Precautions: Fall   Vestibular Assessment   10/09/20 1733  Symptom Behavior  Subjective history of current problem first occurence after he bent down/leaned over at work--+spinning on return to upright, developed nausea and diaphoresis; later awakening with spinning and nausea (eventually progressed to vomiting)  Type of Dizziness  Spinning  Frequency of Dizziness daily  Duration of Dizziness seconds to minutes  Symptom Nature Motion provoked;Positional  Aggravating Factors Turning body quickly;Turning head quickly;Sit to stand;Forward bending  Relieving Factors Head stationary;Closing eyes;Rest;Slow movements  Progression of Symptoms Better  History of  similar episodes none  Oculomotor Exam  Oculomotor Alignment Normal  Ocular ROM normal  Spontaneous Absent  Gaze-induced  Absent  Smooth Pursuits Intact  Saccades Intact  Auditory  Comments denies changes in hearing  Positional Testing  Dix-Hallpike Dix-Hallpike Right;Dix-Hallpike Left  Horizontal Canal Testing Horizontal Canal Right;Horizontal Canal Left;Horizontal Canal Right Intensity;Horizontal Canal Left Intensity  Dix-Hallpike Right  Dix-Hallpike Right Duration 0  Dix-Hallpike Right Symptoms No nystagmus  Dix-Hallpike Left  Dix-Hallpike Left Duration 0  Dix-Hallpike Left Symptoms No nystagmus  Horizontal Canal Right  Horizontal Canal Right Duration 15  Horizontal Canal Right Symptoms Ageotrophic (larger amplitude than left horiz test)  Horizontal Canal Left  Horizontal Canal Left Duration 10  Horizontal Canal Left Symptoms Ageotrophic  Horizontal Canal Right Intensity  Horizontal Canal Right Intensity Mild  Horizontal Canal Left Intensity  Horizontal Canal Left Intensity Mild     10/09/20 0001  Vestibular Treatment/Exercise  Vestibular Treatment Provided Canalith Repositioning  Canalith Repositioning Comment  OTHER  Comment modified semont/casani treating left ear (down to left) with improved symptoms after one rep     Mobility  Bed Mobility Overal bed mobility: Independent             General bed mobility comments: moves slowly  Transfers Overall transfer level: Needs assistance Equipment used: Rolling walker (2 wheeled) Transfers: Sit to/from Stand Sit to Stand: Min guard         General transfer comment: dizziness/slight unsteadiness with sit to stand transfer (after vestib eval and treat)  Ambulation/Gait Ambulation/Gait assistance: Min guard Gait Distance (Feet): 100 Feet Assistive device: Rolling walker (2 wheeled) Gait Pattern/deviations: Step-through pattern;Decreased stride length     General Gait Details: feeling unsteady after  vestib treatment and preferred to use RW; stated he only felt very mild  vertigo when turning his head to the left, no nausea and much better than previously.    Stairs             Wheelchair Mobility    Modified Rankin (Stroke Patients Only)       Balance Overall balance assessment: Needs assistance Sitting-balance support: Feet supported Sitting balance-Leahy Scale: Good Sitting balance - Comments: EOB without physical assist   Standing balance support: During functional activity (pt needed guarding during static standing; during functional activities, pt needed support of walker) Standing balance-Leahy Scale: Poor Standing balance comment: pt stood with a wide BOS; pt was able to maintain balance in static stance with minguard due to dizziness                            Cognition Arousal/Alertness: Awake/alert Behavior During Therapy: WFL for tasks assessed/performed Overall Cognitive Status: Within Functional Limits for tasks assessed                                        Exercises      General Comments        Pertinent Vitals/Pain Pain Assessment: No/denies pain    Home Living                      Prior Function            PT Goals (current goals can now be found in the care plan section) Acute Rehab PT Goals Patient Stated Goal: decrease my dizziness Time For Goal Achievement: 10/23/20 Potential to Achieve Goals: Good Progress towards PT goals: Progressing toward goals    Frequency    Min 3X/week      PT Plan Current plan remains appropriate    Co-evaluation              AM-PAC PT "6 Clicks" Mobility   Outcome Measure  Help needed turning from your back to your side while in a flat bed without using bedrails?: None Help needed moving from lying on your back to sitting on the side of a flat bed without using bedrails?: None Help needed moving to and from a bed to a chair (including a  wheelchair)?: A Little Help needed standing up from a chair using your arms (e.g., wheelchair or bedside chair)?: A Little Help needed to walk in hospital room?: A Little Help needed climbing 3-5 steps with a railing? : A Little 6 Click Score: 20    End of Session   Activity Tolerance: Patient tolerated treatment well Patient left: in chair;with call bell/phone within reach;with chair alarm set Nurse Communication: Mobility status PT Visit Diagnosis: Unsteadiness on feet (R26.81);Other symptoms and signs involving the nervous system (R29.898);BPPV;Other abnormalities of gait and mobility (R26.89) BPPV - Right/Left : Left (horizontal canal)     Time: 1540-0867 PT Time Calculation (min) (ACUTE ONLY): 47 min  Charges:  $Gait Training: 8-22 mins $Self Care/Home Management: 8-22 $Canalith Rep Proc: 8-22 mins                      Arby Barrette, PT Pager [phone]    Rexanne Mano 10/09/2020, 5:42 PM

## 2020-10-09 NOTE — Assessment & Plan Note (Signed)
Resolved Er note reviewed Repeat labs today rto if symptoms return or go to er

## 2020-10-09 NOTE — Evaluation (Signed)
Physical Therapy Evaluation Patient Details Name: Drew [name] MRN: [id number] DOB: [birth year] Today's Date: 10/09/2020   History of Present Illness  64 yo admitted with dizziness worse with head movement with MRI (-). PMhx: CVA, HTN, HLD  Clinical Impression  Pt presents to PT with increased dizziness and lightheadedness with positional changes and a high resting BP. During examination, pt had a positive LT horizontal canal RT beating nystagmus without rotation lasting longer than 1 minute, but with no report of sx. Pt had slight latency of bringing eyes to midline with head thrust test. Negative tests include: vertical and horizontal saccades, smooth pursuit, and bilateral [employer]. Pt however had impaired dynamic balance with gait when targeting the visual and vestibular systems. Pt would benefit from skilled therapy in order to address nystagmus and dynamic balance deficits.     Follow Up Recommendations Outpatient PT;Supervision for mobility/OOB    Equipment Recommendations  Rolling walker with 5" wheels    Recommendations for Other Services  (vestibular therapist consult)     Precautions / Restrictions Precautions Precautions: Fall      Mobility  Bed Mobility Overal bed mobility: Independent               Patient Response: Flat affect (PT had to continue to ask pt sx with each LOB/positional change)  Transfers Overall transfer level: Independent Equipment used: Rolling walker (2 wheeled)             General transfer comment: lightheadedness and dizziness with sit to stand transfer  Ambulation/Gait Ambulation/Gait assistance: Min guard Gait Distance (Feet): 200 Feet Assistive device: Rolling walker (2 wheeled) Gait Pattern/deviations: Staggering left;Staggering right;Wide base of support;Decreased step length - right;Decreased step length - left   Gait velocity interpretation: <1.31 ft/sec, indicative of household ambulator General Gait Details: gait  without walker: arms held out in front and rigid posture and extremities; gait with walker: postural rigidity; gait with horziontal head turns: LOB staggers towards way is head turned; gait with vertical head turns: severe LOB looking up > down and starts to stagger foward  Stairs            Wheelchair Mobility    Modified Rankin (Stroke Patients Only)       Balance Overall balance assessment: Needs assistance Sitting-balance support: Feet supported Sitting balance-Leahy Scale: Good Sitting balance - Comments: EOB without physical assist   Standing balance support: During functional activity (pt needed guarding during static standing; during functional activities, pt needed support of walker) Standing balance-Leahy Scale: Fair Standing balance comment: pt stood with a wide BOS; pt was able to maintain balance in static stance     Tandem Stance - Right Leg: 15 (modified)   Rhomberg - Eyes Opened: 30                   Pertinent Vitals/Pain Pain Assessment: No/denies pain    Home Living Family/patient expects to be discharged to:: Private residence Living Arrangements: Spouse/significant other Available Help at Discharge: Family;Available PRN/intermittently Type of Home: House Home Access: Stairs to enter   [employer] of Steps: 2 Home Layout: One level Home Equipment: None      Prior Function Level of Independence: Independent         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 [employer]        Extremity/Trunk Assessment   Upper Extremity Assessment Upper Extremity Assessment: Overall WFL for tasks assessed    Lower Extremity Assessment Lower Extremity Assessment: Overall  WFL for tasks assessed    Cervical / Trunk Assessment Cervical / Trunk Assessment: Normal  Communication   Communication: No difficulties  Cognition Arousal/Alertness: Awake/alert Behavior During Therapy: WFL for tasks assessed/performed Overall Cognitive  Status: Within Functional Limits for tasks assessed                                        General Comments      Exercises     Assessment/Plan    PT Assessment Patient needs continued PT services  PT Problem List Decreased balance;Cardiopulmonary status limiting activity       PT Treatment Interventions Gait training;Balance training;Stair training;Functional mobility training;Patient/family education;Other (comment) (horizontal canal repositioning)    PT Goals (Current goals can be found in the Care Plan section)  Acute Rehab PT Goals Patient Stated Goal: decrease my dizziness PT Goal Formulation: With patient Time For Goal Achievement: 10/23/20 Potential to Achieve Goals: Fair    Frequency Min 3X/week   Barriers to discharge Decreased caregiver support wife takes care of her mother 24/7; children work but would be able to drive him to appointments    Co-evaluation               AM-PAC PT "6 Clicks" Mobility  Outcome Measure Help needed turning from your back to your side while in a flat bed without using bedrails?: None Help needed moving from lying on your back to sitting on the side of a flat bed without using bedrails?: None Help needed moving to and from a bed to a chair (including a wheelchair)?: A Little Help needed standing up from a chair using your arms (e.g., wheelchair or bedside chair)?: A Lot Help needed to walk in hospital room?: A Lot Help needed climbing 3-5 steps with a railing? : A Lot 6 Click Score: 17    End of Session Equipment Utilized During Treatment: Gait belt Activity Tolerance: Patient tolerated treatment well Patient left: in chair;with call bell/phone within reach;with chair alarm set Nurse Communication: Mobility status;Precautions (high BP and dizziness tolerance with positional changes) PT Visit Diagnosis: Unsteadiness on feet (R26.81);Other symptoms and signs involving the nervous system (R29.898);BPPV;Other  abnormalities of gait and mobility (R26.89) BPPV - Right/Left : Left (horizontal canal)    Time: 7943-2761 PT Time Calculation (min) (ACUTE ONLY): 53 min   Charges:   PT Evaluation $PT Eval Moderate Complexity: 1 Mod PT Treatments $Gait Training: 8-22 mins $Therapeutic Activity: 23-37 mins        [name], SPT [id number]  [name] 10/09/2020, 10:22 AM

## 2020-10-10 ENCOUNTER — Other Ambulatory Visit: Payer: Self-pay | Admitting: Oncology

## 2020-10-10 LAB — CBC
HCT: 46.7 % (ref 39.0–52.0)
Hemoglobin: 16.3 g/dL (ref 13.0–17.0)
MCH: 32.1 pg (ref 26.0–34.0)
MCHC: 34.9 g/dL (ref 30.0–36.0)
MCV: 92.1 fL (ref 80.0–100.0)
Platelets: 209 10*3/uL (ref 150–400)
RBC: 5.07 MIL/uL (ref 4.22–5.81)
RDW: 11.5 % (ref 11.5–15.5)
WBC: 6.8 10*3/uL (ref 4.0–10.5)
nRBC: 0 % (ref 0.0–0.2)

## 2020-10-10 LAB — BASIC METABOLIC PANEL
Anion gap: 8 (ref 5–15)
BUN: 13 mg/dL (ref 8–23)
CO2: 24 mmol/L (ref 22–32)
Calcium: 8.7 mg/dL — ABNORMAL LOW (ref 8.9–10.3)
Chloride: 106 mmol/L (ref 98–111)
Creatinine, Ser: 0.91 mg/dL (ref 0.61–1.24)
GFR, Estimated: >60 mL/min (ref >60)
Glucose, Bld: 98 mg/dL (ref 70–99)
Potassium: 3.7 mmol/L (ref 3.5–5.1)
Sodium: 138 mmol/L (ref 135–145)

## 2020-10-10 LAB — MAGNESIUM: Magnesium: 2.1 mg/dL (ref 1.7–2.4)

## 2020-10-10 MED ORDER — MECLIZINE HCL 25 MG PO TABS: 25.0000 mg | ORAL_TABLET | Freq: Three times a day (TID) | ORAL | 0 refills | Status: DC | PRN

## 2020-10-10 MED ORDER — POTASSIUM CHLORIDE CRYS ER 20 MEQ PO TBCR
40.0000 meq | EXTENDED_RELEASE_TABLET | Freq: Once | ORAL | Status: AC
  Administered 2020-10-10: 40 meq via ORAL
  Filled 2020-10-10: qty 2

## 2020-10-10 MED FILL — MECLIZINE 25 MG TABLET: 25 | 10 days supply | Qty: 30 | Fill #0

## 2020-10-10 NOTE — Discharge Summary (Signed)
Physician Discharge Summary  Drew [name] VVO:[id number] DOB: 1956-09-04 DOA: 10/08/2020  PCP: [name], DO  Admit date: 10/08/2020 Discharge date: 10/10/2020  Admitted From: Home Disposition:  Home  Recommendations for Outpatient Follow-up:  1. Follow up with PCP in 1-2 weeks 2. Please obtain BMP/CBC in one week your next doctors visit.  3. Meclizine prescribed prn 4. Outpatient rehab arranged by [facility]   Discharge Condition: Stable CODE STATUS: Full  Diet recommendation: 2g Na  Brief/Interim Summary: 64 year old with history of CVA, HTN, hyperlipidemia admitted for acute onset of lightheadedness dizziness ongoing for 5 days.  Went to the ER a few days ago, was hydrated and sent home.  His work-up in the hospital including CT head and MRI brain is negative.  Does have some paraspinous disease.While working with therapy it seemed he has BPPV. Vestibular therapy helped him in the hospital. On the day of discharge, he felt much better and requested to be discharged due to family reasons.   Overall feels well and can follow up with PCP.    Assessment & Plan:   Active Problems:   HTN (hypertension)   Hyperlipidemia LDL goal <100   Dehydration   Elevated hemoglobin (HCC)   Vertigo   Metabolic acidosis  Dizziness/vertigo, BPPV?- Improved -Denies any recent upper respiratory tract infection.  MRI, CT head negative -Continue IV fluids -PT/OT working with the patient including vestibular team. Outpatient follow up.  -TSH & B12 normal  Essential hypertension -On home hydrochlorothiazide and lisinopril   Hyperlipidemia -Pravastatin  History of CVA -On aspirin and statin   Body mass index is 23.55 kg/m.         Discharge Diagnoses:  Active Problems:   HTN (hypertension)   Hyperlipidemia LDL goal <100   Dehydration   Elevated hemoglobin (HCC)   Vertigo   Metabolic acidosis   Subjective: Feels great, wants to go home.   Discharge  Exam: Vitals:   10/10/20 0426 10/10/20 0838  BP: (!) 144/86 (!) 143/94  Pulse: (!) 54 66  Resp: 16 17  Temp: 97.6 F (36.4 C) (!) 97.3 F (36.3 C)  SpO2: 96% 98%   Vitals:   10/09/20 1609 10/09/20 2011 10/10/20 0426 10/10/20 0838  BP: 137/83 (!) 152/93 (!) 144/86 (!) 143/94  Pulse: 67 78 (!) 54 66  Resp: 18 18 16 17   Temp: (!) 97.5 F (36.4 C) 97.8 F (36.6 C) 97.6 F (36.4 C) (!) 97.3 F (36.3 C)  TempSrc: Oral Oral Oral Oral  SpO2: 100% 100% 96% 98%  Weight:      Height:        General: Pt is alert, awake, not in acute distress Cardiovascular: RRR, S1/S2 +, no rubs, no gallops Respiratory: CTA bilaterally, no wheezing, no rhonchi Abdominal: Soft, NT, ND, bowel sounds + Extremities: no edema, no cyanosis  Discharge Instructions   Allergies as of 10/10/2020   No Known Allergies     Medication List    TAKE these medications   aspirin EC 325 MG tablet Take 1 tablet (325 mg total) by mouth daily.   Fish Oil 1200 MG Caps Take 1,200 mg by mouth 2 (two) times daily.   Flaxseed Oil 1200 MG Caps Take 2,400 mg by mouth daily.   lisinopril-hydrochlorothiazide 20-12.5 MG tablet Commonly known as: ZESTORETIC Take 2 tablets by mouth daily. Needs ov What changed:   when to take this  additional instructions   meclizine 25 MG tablet Commonly known as: ANTIVERT Take 1 tablet (25  mg total) by mouth 3 (three) times daily as needed for dizziness.   pravastatin 40 MG tablet Commonly known as: PRAVACHOL TAKE 1 TABLET(40 MG) BY MOUTH DAILY What changed: See the new instructions.       Follow-up Information    [name], DO. Schedule an appointment as soon as possible for a visit in 1 week(s).   Specialty: Family Medicine Contact information: [phone] [address] [phone]              No Known Allergies  You were cared for by a hospitalist during your hospital stay. If you have any questions about your discharge  medications or the care you received while you were in the hospital after you are discharged, you can call the unit and asked to speak with the hospitalist on call if the hospitalist that took care of you is not available. Once you are discharged, your primary care physician will handle any further medical issues. Please note that no refills for any discharge medications will be authorized once you are discharged, as it is imperative that you return to your primary care physician (or establish a relationship with a primary care physician if you do not have one) for your aftercare needs so that they can reassess your need for medications and monitor your lab values.   Procedures/Studies: CT Head Wo Contrast  Result Date: 10/08/2020 CLINICAL DATA:  Dizziness. EXAM: CT HEAD WITHOUT CONTRAST TECHNIQUE: Contiguous axial images were obtained from the base of the skull through the vertex without intravenous contrast. COMPARISON:  03/18/2017 FINDINGS: Brain: No evidence of acute infarction, hemorrhage, hydrocephalus, extra-axial collection or mass lesion/mass effect. Vascular: No hyperdense vessel or unexpected calcification. Skull: Normal. Negative for fracture or focal lesion. Sinuses/Orbits: Globes and orbits are unremarkable. Findings consistent with previous sinus surgery. Moderate mucosal thickening lines the ethmoid air cells. Mild mucosal thickening lines the remaining sinus cavities. No sinus air-fluid levels. Other: None. IMPRESSION: 1. No intracranial abnormality. 2. Sinus mucosal thickening as described. No sinus air-fluid levels. Electronically Signed   By: Lajean Manes M.D.   On: 10/08/2020 09:54   MR BRAIN WO CONTRAST  Result Date: 10/08/2020 CLINICAL DATA:  Dizziness, nonspecific. EXAM: MRI HEAD WITHOUT CONTRAST TECHNIQUE: Multiplanar, multiecho pulse sequences of the brain and surrounding structures were obtained without intravenous contrast. COMPARISON:  10/08/2020 head CT and prior.   03/18/2017 MRI/MRA head. FINDINGS: Brain: No diffusion-weighted signal abnormality. No intracranial hemorrhage. No midline shift, ventriculomegaly or extra-axial fluid collection. No mass lesion. Cerebral volume is within normal limits. No significant white matter disease. Vascular: Proximally preserved major intracranial flow voids. Skull and upper cervical spine: Normal marrow signal. Sinuses/Orbits: Normal orbits. Mild to moderate pansinus disease. Trace right mastoid effusion. Other: None. IMPRESSION: No acute intracranial process. Mild to moderate pansinus disease. Electronically Signed   By: Primitivo Gauze M.D.   On: 10/08/2020 15:37      The results of significant diagnostics from this hospitalization (including imaging, microbiology, ancillary and laboratory) are listed below for reference.     Microbiology: Recent Results (from the past 240 hour(s))  Respiratory Panel by RT PCR (Flu A&B, Covid) - Nasopharyngeal Swab     Status: None   Collection Time: 10/08/20  9:17 PM   Specimen: Nasopharyngeal Swab  Result Value Ref Range Status   SARS Coronavirus 2 by RT PCR NEGATIVE NEGATIVE Final    Comment: (NOTE) SARS-CoV-2 target nucleic acids are NOT DETECTED.  The SARS-CoV-2 RNA is generally  detectable in upper respiratoy specimens during the acute phase of infection. The lowest concentration of SARS-CoV-2 viral copies this assay can detect is 131 copies/mL. A negative result does not preclude SARS-Cov-2 infection and should not be used as the sole basis for treatment or other patient management decisions. A negative result may occur with  improper specimen collection/handling, submission of specimen other than nasopharyngeal swab, presence of viral mutation(s) within the areas targeted by this assay, and inadequate number of viral copies (<131 copies/mL). A negative result must be combined with clinical observations, patient history, and epidemiological information. The expected  result is Negative.  Fact Sheet for Patients:  PinkCheek.be  Fact Sheet for Healthcare Providers:  GravelBags.it  This test is no t yet approved or cleared by the [place] FDA and  has been authorized for detection and/or diagnosis of SARS-CoV-2 by FDA under an Emergency Use Authorization (EUA). This EUA will remain  in effect (meaning this test can be used) for the duration of the COVID-19 declaration under Section 564(b)(1) of the Act, 21 U.S.C. section 360bbb-3(b)(1), unless the authorization is terminated or revoked sooner.     Influenza A by PCR NEGATIVE NEGATIVE Final   Influenza B by PCR NEGATIVE NEGATIVE Final    Comment: (NOTE) The Xpert Xpress SARS-CoV-2/FLU/RSV assay is intended as an aid in  the diagnosis of influenza from Nasopharyngeal swab specimens and  should not be used as a sole basis for treatment. Nasal washings and  aspirates are unacceptable for Xpert Xpress SARS-CoV-2/FLU/RSV  testing.  Fact Sheet for Patients: PinkCheek.be  Fact Sheet for Healthcare Providers: GravelBags.it  This test is not yet approved or cleared by the [place] FDA and  has been authorized for detection and/or diagnosis of SARS-CoV-2 by  FDA under an Emergency Use Authorization (EUA). This EUA will remain  in effect (meaning this test can be used) for the duration of the  Covid-19 declaration under Section 564(b)(1) of the Act, 21  U.S.C. section 360bbb-3(b)(1), unless the authorization is  terminated or revoked. Performed at [facility], [place] [address], [place], [place] [address]      Labs: BNP (last 3 results) No results for input(s): BNP in the last 8760 hours. Basic Metabolic Panel: Recent Labs  Lab 10/06/20 1536 10/08/20 1149 10/08/20 2038 10/08/20 2134 10/09/20 0436 10/10/20 0140  NA 139 137  --  138  --  138  K 4.8 4.2  --  4.0   --  3.7  CL 100 102  --   --   --  106  CO2 31 20*  --   --   --  24  GLUCOSE 87 83  --   --   --  98  BUN 9 13  --   --   --  13  CREATININE 0.91 0.92  --   --   --  0.91  CALCIUM 9.7 9.1  --   --   --  8.7*  MG  --   --  2.1  --  1.9 2.1  PHOS  --   --   --   --  3.3  --    Liver Function Tests: Recent Labs  Lab 10/06/20 1536 10/08/20 1149  AST 20 37  ALT 16 17  ALKPHOS  --  52  BILITOT 0.5 2.1*  PROT 6.5 6.4*  ALBUMIN  --  3.5   Recent Labs  Lab 10/08/20 2038  LIPASE 28   No results for input(s):  AMMONIA in the last 168 hours. CBC: Recent Labs  Lab 10/08/20 1149 10/08/20 2134 10/09/20 0436 10/10/20 0140  WBC 9.0  --  9.3 6.8  NEUTROABS 7.1  --  6.6  --   HGB 17.5* 16.3 16.8 16.3  HCT 50.6 48.0 49.0 46.7  MCV 94.4  --  95.0 92.1  PLT 229  --  191 209   Cardiac Enzymes: Recent Labs  Lab 10/08/20 2038  CKTOTAL 79   BNP: Invalid input(s): POCBNP CBG: Recent Labs  Lab 10/08/20 0857  GLUCAP 107*   D-Dimer No results for input(s): DDIMER in the last 72 hours. Hgb A1c No results for input(s): HGBA1C in the last 72 hours. Lipid Profile No results for input(s): CHOL, HDL, LDLCALC, TRIG, CHOLHDL, LDLDIRECT in the last 72 hours. Thyroid function studies Recent Labs    10/09/20 0436  TSH 0.967   Anemia work up Recent Labs    10/09/20 1147  VITAMINB12 371   Urinalysis    Component Value Date/Time   COLORURINE YELLOW 10/08/2020 [place] 10/08/2020 1149   LABSPEC 1.008 10/08/2020 1149   PHURINE 8.0 10/08/2020 1149   GLUCOSEU NEGATIVE 10/08/2020 1149   HGBUR NEGATIVE 10/08/2020 [place] 10/08/2020 1149   BILIRUBINUR Neg 01/18/2014 1651   KETONESUR 20 (A) 10/08/2020 1149   PROTEINUR NEGATIVE 10/08/2020 1149   UROBILINOGEN 0.2 01/18/2014 1651   NITRITE NEGATIVE 10/08/2020 [place] 10/08/2020 1149   Sepsis Labs Invalid input(s): PROCALCITONIN,  WBC,  LACTICIDVEN Microbiology Recent  Results (from the past 240 hour(s))  Respiratory Panel by RT PCR (Flu A&B, Covid) - Nasopharyngeal Swab     Status: None   Collection Time: 10/08/20  9:17 PM   Specimen: Nasopharyngeal Swab  Result Value Ref Range Status   SARS Coronavirus 2 by RT PCR NEGATIVE NEGATIVE Final    Comment: (NOTE) SARS-CoV-2 target nucleic acids are NOT DETECTED.  The SARS-CoV-2 RNA is generally detectable in upper respiratoy specimens during the acute phase of infection. The lowest concentration of SARS-CoV-2 viral copies this assay can detect is 131 copies/mL. A negative result does not preclude SARS-Cov-2 infection and should not be used as the sole basis for treatment or other patient management decisions. A negative result may occur with  improper specimen collection/handling, submission of specimen other than nasopharyngeal swab, presence of viral mutation(s) within the areas targeted by this assay, and inadequate number of viral copies (<131 copies/mL). A negative result must be combined with clinical observations, patient history, and epidemiological information. The expected result is Negative.  Fact Sheet for Patients:  PinkCheek.be  Fact Sheet for Healthcare Providers:  GravelBags.it  This test is no t yet approved or cleared by the [place] FDA and  has been authorized for detection and/or diagnosis of SARS-CoV-2 by FDA under an Emergency Use Authorization (EUA). This EUA will remain  in effect (meaning this test can be used) for the duration of the COVID-19 declaration under Section 564(b)(1) of the Act, 21 U.S.C. section 360bbb-3(b)(1), unless the authorization is terminated or revoked sooner.     Influenza A by PCR NEGATIVE NEGATIVE Final   Influenza B by PCR NEGATIVE NEGATIVE Final    Comment: (NOTE) The Xpert Xpress SARS-CoV-2/FLU/RSV assay is intended as an aid in  the diagnosis of influenza from Nasopharyngeal swab  specimens and  should not be used as a sole basis for treatment. Nasal washings and  aspirates are unacceptable for Xpert Xpress SARS-CoV-2/FLU/RSV  testing.  Fact Sheet for Patients: PinkCheek.be  Fact Sheet for Healthcare Providers: GravelBags.it  This test is not yet approved or cleared by the [place] FDA and  has been authorized for detection and/or diagnosis of SARS-CoV-2 by  FDA under an Emergency Use Authorization (EUA). This EUA will remain  in effect (meaning this test can be used) for the duration of the  Covid-19 declaration under Section 564(b)(1) of the Act, 21  U.S.C. section 360bbb-3(b)(1), unless the authorization is  terminated or revoked. Performed at [facility], [place] [address] [place], [place] [address]      Time coordinating discharge:  I have spent 35 minutes face to face with the patient and on the ward discussing the patients care, assessment, plan and disposition with other care givers. >50% of the time was devoted counseling the patient about the risks and benefits of treatment/Discharge disposition and coordinating care.   SIGNED:   Damita Lack, MD  [facility] 10/10/2020, 8:46 AM   If 7PM-7AM, please contact night-coverage

## 2020-10-10 NOTE — Progress Notes (Signed)
   10/10/20 1000   PT - Assessment/Plan  Follow Up Recommendations Outpatient PT;Supervision for mobility/OOB (Vestibular rehab)  PT equipment Other (comment) (pt to borrow RW)  Treatment completed.  Full note to follow.  Son and grandson to pick pt up.  Thanks. Roselyn Doby W,PT Acute Rehabilitation Services Pager:  [phone]  Office:  [phone]

## 2020-10-10 NOTE — Discharge Instructions (Signed)
Dizziness Dizziness is a common problem. It makes you feel unsteady or light-headed. You may feel like you are about to pass out (faint). Dizziness can lead to getting hurt if you stumble or fall. Dizziness can be caused by many things, including:  Medicines.  Not having enough water in your body (dehydration).  Illness. Follow these instructions at home: Eating and drinking   Drink enough fluid to keep your pee (urine) clear or pale yellow. This helps to keep you from getting dehydrated. Try to drink more clear fluids, such as water.  Do not drink alcohol.  Limit how much caffeine you drink or eat, if your doctor tells you to do that.  Limit how much salt (sodium) you drink or eat, if your doctor tells you to do that. Activity   Avoid making quick movements. ? When you stand up from sitting in a chair, steady yourself until you feel okay. ? In the morning, first sit up on the side of the bed. When you feel okay, stand slowly while you hold onto something. Do this until you know that your balance is fine.  If you need to stand in one place for a long time, move your legs often. Tighten and relax the muscles in your legs while you are standing.  Do not drive or use heavy machinery if you feel dizzy.  Avoid bending down if you feel dizzy. Place items in your home so you can reach them easily without leaning over. Lifestyle  Do not use any products that contain nicotine or tobacco, such as cigarettes and e-cigarettes. If you need help quitting, ask your doctor.  Try to lower your stress level. You can do this by using methods such as yoga or meditation. Talk with your doctor if you need help. General instructions  Watch your dizziness for any changes.  Take over-the-counter and prescription medicines only as told by your doctor. Talk with your doctor if you think that you are dizzy because of a medicine that you are taking.  Tell a friend or a family member that you are feeling  dizzy. If he or she notices any changes in your behavior, have this person call your doctor.  Keep all follow-up visits as told by your doctor. This is important. Contact a doctor if:  Your dizziness does not go away.  Your dizziness or light-headedness gets worse.  You feel sick to your stomach (nauseous).  You have trouble hearing.  You have new symptoms.  You are unsteady on your feet.  You feel like the room is spinning. Get help right away if:  You throw up (vomit) or have watery poop (diarrhea), and you cannot eat or drink anything.  You have trouble: ? Talking. ? Walking. ? Swallowing. ? Using your arms, hands, or legs.  You feel generally weak.  You are not thinking clearly, or you have trouble forming sentences. A friend or family member may notice this.  You have: ? Chest pain. ? Pain in your belly (abdomen). ? Shortness of breath. ? Sweating.  Your vision changes.  You are bleeding.  You have a very bad headache.  You have neck pain or a stiff neck.  You have a fever. These symptoms may be an emergency. Do not wait to see if the symptoms will go away. Get medical help right away. Call your local emergency services (911 in the [place] Do not drive yourself to the hospital. Summary  Dizziness makes you feel unsteady or light-headed. You   may feel like you are about to pass out (faint).  Drink enough fluid to keep your pee (urine) clear or pale yellow. Do not drink alcohol.  Avoid making quick movements if you feel dizzy.  Watch your dizziness for any changes. This information is not intended to replace advice given to you by your health care provider. Make sure you discuss any questions you have with your health care provider. Document Revised: 11/29/2017 Document Reviewed: 12/13/2016 Elsevier Patient Education  [place].  Vertigo Vertigo is the feeling that you or the things around you are moving when they are not. This feeling can come  and go at any time. Vertigo often goes away on its own. This condition can be dangerous if it happens when you are doing activities like driving or working with machines. Your doctor will do tests to find the cause of your vertigo. These tests will also help your doctor decide on the best treatment for you. Follow these instructions at home: Eating and drinking      Drink enough fluid to keep your pee (urine) pale yellow.  Do not drink alcohol. Activity  Return to your normal activities as told by your doctor. Ask your doctor what activities are safe for you.  In the morning, first sit up on the side of the bed. When you feel okay, stand slowly while you hold onto something until you know that your balance is fine.  Move slowly. Avoid sudden body or head movements or certain positions, as told by your doctor.  Use a cane if you have trouble standing or walking.  Sit down right away if you feel dizzy.  Avoid doing any tasks or activities that can cause danger to you or others if you get dizzy.  Avoid bending down if you feel dizzy. Place items in your home so that they are easy for you to reach without leaning over.  Do not drive or use heavy machinery if you feel dizzy. General instructions  Take over-the-counter and prescription medicines only as told by your doctor.  Keep all follow-up visits as told by your doctor. This is important. Contact a doctor if:  Your medicine does not help your vertigo.  You have a fever.  Your problems get worse or you have new symptoms.  Your family or friends see changes in your behavior.  The feeling of being sick to your stomach gets worse.  Your vomiting gets worse.  You lose feeling (have numbness) in part of your body.  You feel prickling and tingling in a part of your body. Get help right away if:  You have trouble moving or talking.  You are always dizzy.  You pass out (faint).  You get very bad headaches.  You feel  weak in your hands, arms, or legs.  You have changes in your hearing.  You have changes in how you see (vision).  You get a stiff neck.  Bright light starts to bother you. Summary  Vertigo is the feeling that you or the things around you are moving when they are not.  Your doctor will do tests to find the cause of your vertigo.  You may be told to avoid some tasks, positions, or movements.  Contact a doctor if your medicine is not helping, or if you have a fever, new symptoms, or a change in behavior.  Get help right away if you get very bad headaches, or if you have changes in how you speak, hear, or  see. This information is not intended to replace advice given to you by your health care provider. Make sure you discuss any questions you have with your health care provider. Document Revised: 10/20/2018 Document Reviewed: 10/20/2018 Elsevier Patient Education  2020 [employer].

## 2020-10-10 NOTE — Evaluation (Signed)
Occupational Therapy Evaluation Patient Details Name: Drew [name] MRN: [id number] DOB: 1956/11/07 Today's Date: 10/10/2020    History of Present Illness 64 yo admitted with dizziness worse with head movement with MRI (-). PMhx: CVA, HTN, HLD   Clinical Impression   Patient evaluated by Occupational Therapy with no further acute OT needs identified. All education has been completed and the patient has no further questions. See below for any follow-up Occupational Therapy or equipment needs. OT to sign off. Thank you for referral.      Follow Up Recommendations  No OT follow up    Equipment Recommendations  None recommended by OT    Recommendations for Other Services       Precautions / Restrictions Precautions Precautions: Fall      Mobility Bed Mobility               General bed mobility comments: eob sitting on arrival    Transfers Overall transfer level: Needs assistance Equipment used: Rolling walker (2 wheeled) Transfers: Sit to/from Stand Sit to Stand: Min guard         General transfer comment: discussed dynamic setting vs static environment for transfer especially riding in car with gaze stabilization to help with dizzines    Balance                                           ADL either performed or assessed with clinical judgement   ADL Overall ADL's : Needs assistance/impaired Eating/Feeding: Independent   Grooming: Wash/dry hands;Supervision/safety   Upper Body Bathing: Supervision/ safety   Lower Body Bathing: Supervison/ safety   Upper Body Dressing : Supervision/safety   Lower Body Dressing: Supervision/safety Lower Body Dressing Details (indicate cue type and reason): educated on figure 4 and able to Technical brewer Transfer: Copy Details (indicate cue type and reason): use of RW and cues for head control in neutral with gaze stabilization       Tub/Shower Transfer Details (indicate  cue type and reason): educaetd on use of DME and agreeable Functional mobility during ADLs: Min guard;Rolling walker General ADL Comments: pt asking questions about recovering and educated on handout left by PT Dawn on table. Pt has family to (A) upon d/c from son and grandson     Vision Baseline Vision/History: Wears glasses Wears Glasses: At all times       Perception     Praxis      Pertinent Vitals/Pain Pain Assessment: No/denies pain     Hand Dominance Right   Extremity/Trunk Assessment Upper Extremity Assessment Upper Extremity Assessment: Overall WFL for tasks assessed       Cervical / Trunk Assessment Cervical / Trunk Assessment: Normal   Communication Communication Communication: No difficulties   Cognition Arousal/Alertness: Awake/alert Behavior During Therapy: WFL for tasks assessed/performed Overall Cognitive Status: Within Functional Limits for tasks assessed                                     General Comments       Exercises     Shoulder Instructions      Home Living Family/patient expects to be discharged to:: Private residence Living Arrangements: Spouse/significant other Available Help at Discharge: Family;Available PRN/intermittently Type of Home: House Home Access: Stairs to enter [employer] of Steps: 2  Home Layout: One level     Bathroom Shower/Tub: Teacher, early years/pre: Standard     Home Equipment: Environmental consultant - 2 wheels;Shower seat   Additional Comments: can borrow DME from mother in law.  mother in law passed away this AM      Prior Functioning/Environment Level of Independence: Independent        Comments: Data processing manager        OT Problem List:        OT Treatment/Interventions:      OT Goals(Current goals can be found in the care plan section)    OT Frequency:     Barriers to D/C:            Co-evaluation              AM-PAC OT "6 Clicks" Daily Activity      Outcome Measure Help from another person eating meals?: None Help from another person taking care of personal grooming?: None Help from another person toileting, which includes using toliet, bedpan, or urinal?: A Little Help from another person bathing (including washing, rinsing, drying)?: A Little Help from another person to put on and taking off regular upper body clothing?: None Help from another person to put on and taking off regular lower body clothing?: None 6 Click Score: 22   End of Session Equipment Utilized During Treatment: Gait belt;Rolling walker Nurse Communication: Mobility status;Precautions  Activity Tolerance: Patient tolerated treatment well Patient left: in chair;with chair alarm set;with call bell/phone within reach  OT Visit Diagnosis: Unsteadiness on feet (R26.81)                Time: 5830-9407 OT Time Calculation (min): 20 min Charges:  OT General Charges $OT Visit: 1 Visit OT Evaluation $OT Eval Moderate Complexity: 1 Mod   Brynn, OTR/L  Acute Rehabilitation Services Pager: [phone] Office: [phone] .   Jeri Modena 10/10/2020, 4:13 PM

## 2020-10-10 NOTE — Progress Notes (Signed)
Physical Therapy Treatment Patient Details Name: Drew [name] MRN: [id number] DOB: 12-Jul-1956 Today's Date: 10/10/2020    History of Present Illness 64 yo admitted with dizziness worse with head movement with MRI (-). PMhx: CVA, HTN, HLD    PT Comments    Pt admitted with above diagnosis. Pt was able to tolerate canalith repositioning maneuvers for posterior canal BPPV and for horizontal canal BPPV.  Pt was dizzy after these maneuvers (as expected) therefore was unsteady with his gait and needed min assist as well as use of RW and gait belt for safety.  Pt aware that PT recommends use of RW at all times for safety and to have assist on steps.  Pt anxious to go home as his mother in law passed away this am.  Recommend Outpt PT for vestibular rehab as well and pt agrees.  Pt currently with functional limitations due to balance and endurance deficits with dizziness due to vertigo. Pt will benefit from skilled PT to increase their independence and safety with mobility to allow discharge to the venue listed below.     Follow Up Recommendations  Outpatient PT;Supervision for mobility/OOB (Vestibular rehab)     Equipment Recommendations  Other (comment)    Recommendations for Other Services  (vestibular therapist consult)     Precautions / Restrictions Precautions Precautions: Fall Restrictions Weight Bearing Restrictions: No    Mobility  Bed Mobility Overal bed mobility: Independent             General bed mobility comments: moves slowly  Transfers Overall transfer level: Needs assistance Equipment used: Rolling walker (2 wheeled) Transfers: Sit to/from Stand Sit to Stand: Min guard         General transfer comment: dizziness/slight unsteadiness with sit to stand transfer (after vestib eval and treat)  Ambulation/Gait Ambulation/Gait assistance: Min guard;Min assist Gait Distance (Feet): 250 Feet Assistive device: Rolling walker (2 wheeled) Gait Pattern/deviations:  Step-through pattern;Decreased stride length;Drifts right/left;Wide base of support   Gait velocity interpretation: <1.31 ft/sec, indicative of household ambulator General Gait Details: feeling unsteady after vestib treatment and preferred to use RW as once he took a few steps had LOB needing min assist to steady.  Pt seemed surprised and was agreeable to calling wife to borrow RW as he realizes he is unsteady.  Pt measured for gait belt and instructed to have assist until his dizziness improves.     Stairs Stairs: Yes Stairs assistance: Min assist;Min guard Stair Management: No rails;Step to pattern;Forwards;Backwards;With walker Number of Stairs: 4 General stair comments: Initially pt up and down without device with HHA and pt generally unsteady due to dizziness needing min assist to steady. Showed pt how to use the RW to go up and down backwards and pt is safeer and encouraged to perform steps in this manner.    Wheelchair Mobility    Modified Rankin (Stroke Patients Only)       Balance Overall balance assessment: Needs assistance Sitting-balance support: Feet supported;No upper extremity supported Sitting balance-Leahy Scale: Good Sitting balance - Comments: EOB without physical assist   Standing balance support: During functional activity;Bilateral upper extremity supported (pt needed guarding during static standing; during functional activities, pt needed support of walker) Standing balance-Leahy Scale: Poor Standing balance comment: pt stood with a wide BOS; pt was able to maintain balance in static stance with minguard due to dizziness  Cognition Arousal/Alertness: Awake/alert Behavior During Therapy: WFL for tasks assessed/performed Overall Cognitive Status: Within Functional Limits for tasks assessed                                        Exercises Other Exercises Other Exercises: Nestor Lewandowsky exercise handout  given to pt and educated pt regarding the technique.      General Comments General comments (skin integrity, edema, etc.): Pt with slight nystagmus with head movements.  Treated for left horizontal cupulolithiasis with BBQ roll.  Did perform a canalith repositioning on the left as well as unsure if crystals were dislodged to ensure that canals were cleared if possible.       Pertinent Vitals/Pain Pain Assessment: No/denies pain    Home Living                      Prior Function            PT Goals (current goals can now be found in the care plan section) Acute Rehab PT Goals Patient Stated Goal: decrease my dizziness Progress towards PT goals: Progressing toward goals    Frequency    Min 3X/week      PT Plan Current plan remains appropriate    Co-evaluation              AM-PAC PT "6 Clicks" Mobility   Outcome Measure  Help needed turning from your back to your side while in a flat bed without using bedrails?: None Help needed moving from lying on your back to sitting on the side of a flat bed without using bedrails?: None Help needed moving to and from a bed to a chair (including a wheelchair)?: A Little Help needed standing up from a chair using your arms (e.g., wheelchair or bedside chair)?: A Little Help needed to walk in hospital room?: A Little Help needed climbing 3-5 steps with a railing? : A Little 6 Click Score: 20    End of Session Equipment Utilized During Treatment: Gait belt Activity Tolerance: Patient tolerated treatment well Patient left: Other (comment) (standing at sink with OT in room) Nurse Communication: Mobility status PT Visit Diagnosis: Unsteadiness on feet (R26.81);Other symptoms and signs involving the nervous system (R29.898);BPPV;Other abnormalities of gait and mobility (R26.89) BPPV - Right/Left : Left (horizontal canal)     Time: 6606-0045 PT Time Calculation (min) (ACUTE ONLY): 28 min  Charges:  $Gait Training: 8-22  mins $Canalith Rep Proc: 8-22 mins                     Burney Calzadilla W,PT Acute Rehabilitation Services Pager:  [phone]  Office:  [place] 10/10/2020, 12:48 PM

## 2020-10-10 NOTE — Progress Notes (Signed)
Discharged to home after IV access removed and discharge instructions reviewed with.  Transitions pharmacy brought meds.

## 2020-10-10 NOTE — TOC Transition Note (Signed)
Transition of Care [facility]) - CM/SW Discharge Note   Patient Details  Name: Drew [name] MRN: [id number] Date of Birth: 03/21/1956  Transition of Care [facility]) CM/SW Contact:  Pollie Friar, RN Phone Number: 10/10/2020, 11:35 AM   Clinical Narrative:    Pt discharging home with spouse. Pt prefers outpatient therapy at [facility]. CM called and they do have vestibular rehab. Orders sent through Epic and information on the AVS. Pt states he has a walker at home.  Wife to provide transport home.   Final next level of care: OP Rehab Barriers to Discharge: No Barriers Identified   Patient Goals and CMS Choice     Choice offered to / list presented to : Patient  Discharge Placement                       Discharge Plan and Services                                     Social Determinants of Health (SDOH) Interventions     Readmission Risk Interventions No flowsheet data found.

## 2020-10-11 ENCOUNTER — Telehealth: Payer: Self-pay

## 2020-10-11 LAB — FOLATE RBC
Folate, Hemolysate: 550 ng/mL
Folate, RBC: 1120 ng/mL (ref >498)
Hematocrit: 49.1 % (ref 37.5–51.0)

## 2020-10-11 NOTE — Telephone Encounter (Signed)
Pt was admitted to the hospital on 10/08/20 to 10/10/20. FYI

## 2020-10-11 NOTE — Telephone Encounter (Signed)
Transition Care Management Follow-up Telephone Call  Date of discharge and from where: 10/10/2020-[place]  How have you been since you were released from the hospital? Good  Any questions or concerns? No  Items Reviewed:  Did the pt receive and understand the discharge instructions provided? Yes   Medications obtained and verified? Yes   Other? n/a  Any new allergies since your discharge? No   Dietary orders reviewed? Yes  Do you have support at home? Yes   Home Care and Equipment/Supplies: Were home health services ordered? no If so, what is the name of the agency? N/a  Has the agency set up a time to come to the patient's home? not applicable Were any new equipment or medical supplies ordered?  No What is the name of the medical supply agency? n/a Were you able to get the supplies/equipment? not applicable Do you have any questions related to the use of the equipment or supplies? No  Functional Questionnaire: (I = Independent and D = Dependent) ADLs: I  Bathing/Dressing- I  Meal Prep- I  Eating- I  Maintaining continence- I  Transferring/Ambulation- I  Managing Meds- I  Follow up appointments reviewed:   PCP Hospital f/u appt confirmed? No  Patient wants to let his wife call back to schedule  [facility] f/u appt confirmed? N/A  Are transportation arrangements needed? No   If their condition worsens, is the pt aware to call PCP or go to the Emergency Dept.? Yes  Was the patient provided with contact information for the PCP's office or ED? Yes  Was to pt encouraged to call back with questions or concerns? Yes

## 2020-10-18 ENCOUNTER — Encounter: Payer: Self-pay | Admitting: Physical Therapy

## 2020-10-18 ENCOUNTER — Ambulatory Visit: Payer: Medicare Other | Attending: Internal Medicine | Admitting: Physical Therapy

## 2020-10-18 ENCOUNTER — Other Ambulatory Visit: Payer: Self-pay

## 2020-10-18 DIAGNOSIS — R42 Dizziness and giddiness: Secondary | ICD-10-CM | POA: Insufficient documentation

## 2020-10-18 DIAGNOSIS — R2681 Unsteadiness on feet: Secondary | ICD-10-CM | POA: Insufficient documentation

## 2020-10-18 NOTE — Therapy (Addendum)
[facility] [address]  [place], [place], [address] Phone: [phone]   Fax:  [phone]  Physical Therapy Evaluation  Patient Details  Name: Drew [name] MRN: [id number] Date of Birth: [birth year] Referring Provider (PT): Ankit Arsenio Loader, MD   Progress Note Reporting Period 10/18/20 to 10/18/20  See note below for Objective Data and Assessment of Progress/Goals.     Encounter Date: 10/18/2020   PT End of Session - 10/18/20 1531    Visit Number 1    Number of Visits 1    Date for PT Re-Evaluation 10/18/20    Authorization Type Medicare & [employer]etna    PT Start Time 1402    PT Stop Time 1440    PT Time Calculation (min) 38 min    Equipment Utilized During Treatment Gait belt    Activity Tolerance Patient tolerated treatment well    Behavior During Therapy WFL for tasks assessed/performed           Past Medical History:  Diagnosis Date  . CVA (cerebral vascular accident) ([place])   . High cholesterol   . Hypertension   . Memory loss   . Tubular adenoma of colon     Past Surgical History:  Procedure Laterality Date  . COLONOSCOPY    . HERNIA REPAIR Left 2004  . POLYPECTOMY      There were no vitals filed for this visit.    Subjective Assessment - 10/18/20 1403    Subjective Patient reports that he was hospitalized in October for dizziness. Felt much better by last Friday. Now no longer walking with cane, able to navigate stairs. Has been working on some exercises given to him by the PT from the hospital and also found some to perform on YouTube. Only have some remaining dizziness with quick turning. Denies dizziness when rolling or getting up out of bed. Denies hearing loss, otalgia, otorrhea, head trauma, recent illness/infection, or changes in balance.    Pertinent History memory loss, HTN, HLD, CVA    Limitations Walking;House hold activities    Patient Stated Goals patient unsure    Currently  in Pain? No/denies              [facility] PT Assessment - 10/18/20 1407      Assessment   Medical Diagnosis Dizziness    Referring Provider (PT) Ankit Arsenio Loader, MD    Onset Date/Surgical Date 10/07/20    Prior Therapy no      Balance Screen   Has the patient fallen in the past 6 months No    Has the patient had a decrease in activity level because of a fear of falling?  No    Is the patient reluctant to leave their home because of a fear of falling?  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    Leisure none      Cognition  Overall Cognitive Status Within Functional Limits for tasks assessed      Ambulation/Gait   Assistive device None    Gait Pattern Within Functional Limits    Ambulation Surface Level;Indoor    Gait velocity [facility]                  Vestibular Assessment - 10/18/20 0001      Oculomotor Exam   Oculomotor Alignment Normal    Ocular ROM normal    Spontaneous Absent    Gaze-induced  Absent    Smooth Pursuits Intact    Saccades Undershoots   slight undershoot with superior and R saccades   Comment convergence intact      Oculomotor Exam-Fixation Suppressed    Left Head Impulse positive    non symptomatic     Vestibulo-Ocular Reflex   VOR 1 Head Only (x 1 viewing) intact vertical and horizontal without dizziness    VOR Cancellation Corrective saccades   to L     Positional Testing   Horizontal Canal Testing Horizontal Canal Right;Horizontal Canal Left;Horizontal Canal Right  Intensity;Horizontal Canal Left Intensity      Dix-Hallpike Right   Dix-Hallpike Right Duration 0    Dix-Hallpike Right Symptoms No nystagmus      Dix-Hallpike Left   Dix-Hallpike Left Duration 0    Dix-Hallpike Left Symptoms No nystagmus      Horizontal Canal Right   Horizontal Canal Right Duration 20    Horizontal Canal Right Symptoms Ageotrophic   very slight; asymptomatic     Horizontal Canal Left   Horizontal Canal Left Symptoms Normal              Objective measurements completed on examination: See above findings.        Vestibular Treatment/Exercise - 10/18/20 0001      Vestibular Treatment/Exercise   Canalith Repositioning Canal Roll Right      Canal Roll Right   Number of Reps  1    Overall Response  Symptoms Resolved    Response Details  BBQ roll for R horizontal canal- tolerated well and without dizziness throughout                 PT Education - 10/18/20 1530    Education Details HEP for balance with use of compliant surface to be performed in front of counter for safety; edu on remaining impairments    Person(s) Educated Patient    Methods Explanation;Demonstration;Tactile cues;Verbal cues;Handout    Comprehension Verbalized understanding            PT Short Term Goals - 10/18/20 1545      PT SHORT TERM GOAL #1   Title Patient to report understanding of exam findings.    Period Days    Status Achieved    Target Date 10/18/20             PT Long Term Goals - 10/18/20 1546      PT LONG TERM GOAL #1   Title Patient to report understanding of HEP.    Period Days    Status Achieved    Target Date 10/18/20                  Plan - 10/18/20 1532    Clinical Impression Statement Patient is a 64y/o M presenting to OPPT with c/o dizziness since 10/07/20. Patient was hospitalized for dizziness 10/08/20-10/10/20 despite being given Antivert with normal head MRI and CT. Positive L horizontal cupulolithiasis and head impulse  test noted was found exam and patient treated with BBQ roll. Patient now ambulating without AD and noting only some remaining dizziness with quick head turns. Denies hearing loss, otalgia, otorrhea, head trauma, recent illness/infection, or changes in balance. Patient today presenting with slight undershooting with superior and R saccades, positive L head impulse test, corrective saccades with L VOR cancellation, very slight but asymptomatic R roll test, and imbalance on compliant surface without visual fixation. However, was unable to re-create patient's dizziness today. Patient was treated with R BBQ roll, which was tolerated well. Educated patient on balance HEP- patient reported understanding. Patient seen for 1 time visit today per his request.    Personal Factors and Comorbidities Age;Comorbidity 3+;Past/Current Experience;Profession;Time since onset of injury/illness/exacerbation    Comorbidities memory loss, HTN, HLD, CVA    Examination-Activity Limitations Locomotion Level;Stand    Examination-Participation Restrictions Community Activity;Driving;Yard Work;Laundry;Occupation    Stability/Clinical Decision Making Stable/Uncomplicated    Clinical Decision Making Low    Rehab Potential Good    PT Frequency One time visit    PT Treatment/Interventions Canalith Repostioning;Balance training;Therapeutic exercise;Therapeutic activities;Functional mobility training;Stair training;Gait training;Neuromuscular re-education;Patient/family education;Energy conservation    PT Next Visit Plan DC per patient's request    Consulted and Agree with Plan of Care Patient           Patient will benefit from skilled therapeutic intervention in order to improve the following deficits and impairments:  Dizziness, Decreased balance, Decreased activity tolerance  Visit Diagnosis: Dizziness and giddiness  Unsteadiness on feet     Problem List Patient Active Problem List   Diagnosis Date Noted  .  Dehydration 10/08/2020  . Elevated hemoglobin ([place]) 10/08/2020  . Vertigo 10/08/2020  . Metabolic acidosis 57/84/6962  . Preventative health care 04/22/2020  . Ischemic stroke ([place]) 03/18/2017  . Hyperlipidemia LDL goal <100 07/19/2016  . HTN (hypertension) 01/18/2014       [facility] [address]  [place], [place], [address] Phone: [phone]   Fax:  [phone]  Name: DEROY NOAH MRN: [id number] Date of Birth: 1956/06/21  PHYSICAL THERAPY DISCHARGE SUMMARY  Visits from Start of Care: 1  Current functional level related to goals / functional outcomes: See above clinical impression; patient asymptomatic throughout testing, set up with HEP, and requested DC at this time    Remaining deficits: Imbalance on compliant surfaces, positive oculomotor exam   Education / Equipment: HEP  Plan: Patient agrees to discharge.  Patient goals were met. Patient is being discharged due to being pleased with the current functional level.  ?????     Janene Harvey, PT, DPT 10/18/20 3:53 PM

## 2020-10-20 ENCOUNTER — Ambulatory Visit (INDEPENDENT_AMBULATORY_CARE_PROVIDER_SITE_OTHER): Payer: Medicare Other | Admitting: Family Medicine

## 2020-10-20 ENCOUNTER — Other Ambulatory Visit: Payer: Self-pay

## 2020-10-20 ENCOUNTER — Encounter: Payer: Self-pay | Admitting: Family Medicine

## 2020-10-20 VITALS — BP 118/80 | HR 66 | Temp 98.1°F | Resp 18 | Ht 67.0 in | Wt 152.8 lb

## 2020-10-20 DIAGNOSIS — R42 Dizziness and giddiness: Secondary | ICD-10-CM

## 2020-10-20 DIAGNOSIS — I639 Cerebral infarction, unspecified: Secondary | ICD-10-CM

## 2020-10-20 DIAGNOSIS — D582 Other hemoglobinopathies: Secondary | ICD-10-CM

## 2020-10-20 LAB — BASIC METABOLIC PANEL
BUN: 8 mg/dL (ref 6–23)
CO2: 34 mEq/L — ABNORMAL HIGH (ref 19–32)
Calcium: 9.6 mg/dL (ref 8.4–10.5)
Chloride: 97 mEq/L (ref 96–112)
Creatinine, Ser: 0.85 mg/dL (ref 0.40–1.50)
GFR: 91.23 mL/min (ref >60.00)
Glucose, Bld: 61 mg/dL — ABNORMAL LOW (ref 70–99)
Potassium: 4.3 mEq/L (ref 3.5–5.1)
Sodium: 139 mEq/L (ref 135–145)

## 2020-10-20 LAB — CBC WITH DIFFERENTIAL/PLATELET
Basophils Absolute: 0.1 10*3/uL (ref 0.0–0.1)
Basophils Relative: 1.1 % (ref 0.0–3.0)
Eosinophils Absolute: 0.4 10*3/uL (ref 0.0–0.7)
Eosinophils Relative: 6.4 % — ABNORMAL HIGH (ref 0.0–5.0)
HCT: 48.7 % (ref 39.0–52.0)
Hemoglobin: 16.6 g/dL (ref 13.0–17.0)
Lymphocytes Relative: 25.1 % (ref 12.0–46.0)
Lymphs Abs: 1.7 10*3/uL (ref 0.7–4.0)
MCHC: 34 g/dL (ref 30.0–36.0)
MCV: 95.8 fl (ref 78.0–100.0)
Monocytes Absolute: 0.6 10*3/uL (ref 0.1–1.0)
Monocytes Relative: 8.3 % (ref 3.0–12.0)
Neutro Abs: 4 10*3/uL (ref 1.4–7.7)
Neutrophils Relative %: 59.1 % (ref 43.0–77.0)
Platelets: 229 10*3/uL (ref 150.0–400.0)
RBC: 5.08 Mil/uL (ref 4.22–5.81)
RDW: 12.7 % (ref 11.5–15.5)
WBC: 6.7 10*3/uL (ref 4.0–10.5)

## 2020-10-20 NOTE — Progress Notes (Signed)
Patient ID: Drew [name], male    DOB: 08-07-1956  Age: 64 y.o. MRN: [id number]    Subjective:  Subjective  HPI Drew [name] presents for f/u from hosp for vertigo.  Ct and mri of brain done---  Negative Pt has ot/ pt set up as outpatient. He was admitted 10/30-11/1  He has had no more dizzy spells   Review of Systems  Constitutional: Negative for appetite change, diaphoresis, fatigue and unexpected weight change.  HENT: Negative.   Eyes: Negative for pain, redness and visual disturbance.  Respiratory: Negative for cough, chest tightness, shortness of breath and wheezing.   Cardiovascular: Negative for chest pain, palpitations and leg swelling.  Endocrine: Negative for cold intolerance, heat intolerance, polydipsia, polyphagia and polyuria.  Genitourinary: Negative for difficulty urinating, dysuria and frequency.  Neurological: Negative for dizziness, light-headedness, numbness and headaches.    History Past Medical History:  Diagnosis Date  . CVA (cerebral vascular accident) ([place])   . High cholesterol   . Hypertension   . Memory loss   . Tubular adenoma of colon     He has a past surgical history that includes Hernia repair (Left, 2004); Colonoscopy; and Polypectomy.   His family history includes Breast cancer in his sister; Berenice Primas' disease in his mother; Heart disease in his father, mother, paternal uncle, paternal uncle, paternal uncle, paternal uncle, paternal uncle, and paternal uncle; Hyperlipidemia in his brother; Hypertension in his brother and brother.He reports that he quit smoking about 32 years ago. His smoking use included cigarettes. He has a 15.00 pack-year smoking history. His smokeless tobacco use includes chew. He reports that he does not drink alcohol and does not use drugs.  Current Outpatient Medications on File Prior to Visit  Medication Sig Dispense Refill  . aspirin EC 325 MG tablet Take 1 tablet (325 mg total) by mouth daily. 30 tablet 0  . Flaxseed,  Linseed, (FLAXSEED OIL) 1200 MG CAPS Take 2,400 mg by mouth daily.     Marland Kitchen lisinopril-hydrochlorothiazide (ZESTORETIC) 20-12.5 MG tablet Take 2 tablets by mouth daily. Needs ov (Patient taking differently: Take 2 tablets by mouth in the morning. ) 180 tablet 1  . Omega-3 Fatty Acids (FISH OIL) 1200 MG CAPS Take 1,200 mg by mouth 2 (two) times daily.     . pravastatin (PRAVACHOL) 40 MG tablet TAKE 1 TABLET(40 MG) BY MOUTH DAILY (Patient taking differently: Take 40 mg by mouth daily. ) 90 tablet 1  . meclizine (ANTIVERT) 25 MG tablet Take 1 tablet (25 mg total) by mouth 3 (three) times daily as needed for dizziness. (Patient not taking: Reported on 10/18/2020) 30 tablet 0   No current facility-administered medications on file prior to visit.     Objective:  Objective  Physical Exam Vitals and nursing note reviewed.  Constitutional:      General: He is sleeping.     Appearance: He is well-developed.  HENT:     Head: Normocephalic and atraumatic.  Eyes:     Pupils: Pupils are equal, round, and reactive to light.  Neck:     Thyroid: No thyromegaly.  Cardiovascular:     Rate and Rhythm: Normal rate and regular rhythm.     Heart sounds: No murmur heard.   Pulmonary:     Effort: Pulmonary effort is normal. No respiratory distress.     Breath sounds: Normal breath sounds. No wheezing or rales.  Chest:     Chest wall: No tenderness.  Musculoskeletal:  General: No tenderness.     Cervical back: Normal range of motion and neck supple.  Skin:    General: Skin is warm and dry.  Neurological:     Mental Status: He is oriented to person, place, and time.     Motor: No weakness.     Gait: Gait normal.  Psychiatric:        Behavior: Behavior normal.        Thought Content: Thought content normal.        Judgment: Judgment normal.    BP 118/80 (BP Location: Left Arm, Patient Position: Sitting, Cuff Size: Normal)   Pulse 66   Temp 98.1 F (36.7 C) (Oral)   Resp 18   Ht 5\' 7"  (1.702  m)   Wt 152 lb 12.8 oz (69.3 kg)   SpO2 98%   BMI 23.93 kg/m  Wt Readings from Last 3 Encounters:  10/20/20 152 lb 12.8 oz (69.3 kg)  10/09/20 150 lb 5.7 oz (68.2 kg)  10/06/20 150 lb 6.4 oz (68.2 kg)     Lab Results  Component Value Date   WBC 6.8 10/10/2020   HGB 16.3 10/10/2020   HCT 46.7 10/10/2020   PLT 209 10/10/2020   GLUCOSE 98 10/10/2020   CHOL 155 10/06/2020   TRIG 100 10/06/2020   HDL 48 10/06/2020   LDLCALC 87 10/06/2020   ALT 17 10/08/2020   AST 37 10/08/2020   NA 138 10/10/2020   K 3.7 10/10/2020   CL 106 10/10/2020   CREATININE 0.91 10/10/2020   BUN 13 10/10/2020   CO2 24 10/10/2020   TSH 0.967 10/09/2020   PSA 2.18 04/21/2020   HGBA1C 5.4 10/06/2020    CT Head Wo Contrast  Result Date: 10/08/2020 CLINICAL DATA:  Dizziness. EXAM: CT HEAD WITHOUT CONTRAST TECHNIQUE: Contiguous axial images were obtained from the base of the skull through the vertex without intravenous contrast. COMPARISON:  03/18/2017 FINDINGS: Brain: No evidence of acute infarction, hemorrhage, hydrocephalus, extra-axial collection or mass lesion/mass effect. Vascular: No hyperdense vessel or unexpected calcification. Skull: Normal. Negative for fracture or focal lesion. Sinuses/Orbits: Globes and orbits are unremarkable. Findings consistent with previous sinus surgery. Moderate mucosal thickening lines the ethmoid air cells. Mild mucosal thickening lines the remaining sinus cavities. No sinus air-fluid levels. Other: None. IMPRESSION: 1. No intracranial abnormality. 2. Sinus mucosal thickening as described. No sinus air-fluid levels. Electronically Signed   By: Lajean Manes M.D.   On: 10/08/2020 09:54   MR BRAIN WO CONTRAST  Result Date: 10/08/2020 CLINICAL DATA:  Dizziness, nonspecific. EXAM: MRI HEAD WITHOUT CONTRAST TECHNIQUE: Multiplanar, multiecho pulse sequences of the brain and surrounding structures were obtained without intravenous contrast. COMPARISON:  10/08/2020 head CT and  prior.  03/18/2017 MRI/MRA head. FINDINGS: Brain: No diffusion-weighted signal abnormality. No intracranial hemorrhage. No midline shift, ventriculomegaly or extra-axial fluid collection. No mass lesion. Cerebral volume is within normal limits. No significant white matter disease. Vascular: Proximally preserved major intracranial flow voids. Skull and upper cervical spine: Normal marrow signal. Sinuses/Orbits: Normal orbits. Mild to moderate pansinus disease. Trace right mastoid effusion. Other: None. IMPRESSION: No acute intracranial process. Mild to moderate pansinus disease. Electronically Signed   By: Primitivo Gauze M.D.   On: 10/08/2020 15:37     Assessment & Plan:  Plan  I am having Marily Lente. [place] maintain his Flaxseed Oil, Fish Oil, aspirin EC, pravastatin, lisinopril-hydrochlorothiazide, and meclizine.  No orders of the defined types were placed in this encounter.   Problem List Items  Addressed This Visit      Unprioritized   Elevated hemoglobin ([place])    Repeat cbcd Pt has was referred to hematology but hospital       Vertigo - Primary    Recheck labs per d/c summary       Relevant Orders   CBC with Differential/Platelet   Basic metabolic panel      Follow-up: Return in about 6 months (around 04/19/2021), or if symptoms worsen or fail to improve, for annual exam, fasting.  [name], DO

## 2020-10-20 NOTE — Assessment & Plan Note (Signed)
Recheck labs per d/c summary

## 2020-10-20 NOTE — Patient Instructions (Signed)

## 2020-10-20 NOTE — Assessment & Plan Note (Signed)
Repeat cbcd Pt has was referred to hematology but hospital

## 2020-10-26 ENCOUNTER — Telehealth: Payer: Self-pay

## 2020-10-26 NOTE — Telephone Encounter (Signed)
Spoke with pts wife and she is aware of the new pt appt for her husband, a calendar will be mailed to the pt  AOM

## 2020-11-18 ENCOUNTER — Other Ambulatory Visit: Payer: Self-pay | Admitting: Oncology

## 2020-11-23 ENCOUNTER — Telehealth: Payer: Self-pay | Admitting: Family

## 2020-11-23 ENCOUNTER — Encounter: Payer: Self-pay | Admitting: Family

## 2020-11-23 ENCOUNTER — Inpatient Hospital Stay: Payer: Medicare Other | Attending: Hematology & Oncology

## 2020-11-23 ENCOUNTER — Inpatient Hospital Stay (HOSPITAL_BASED_OUTPATIENT_CLINIC_OR_DEPARTMENT_OTHER): Payer: Medicare Other | Admitting: Family

## 2020-11-23 ENCOUNTER — Other Ambulatory Visit: Payer: Self-pay

## 2020-11-23 ENCOUNTER — Other Ambulatory Visit: Payer: Self-pay | Admitting: Family

## 2020-11-23 VITALS — BP 121/80 | HR 62 | Temp 97.5°F | Resp 18 | Ht 67.0 in | Wt 150.1 lb

## 2020-11-23 DIAGNOSIS — D45 Polycythemia vera: Secondary | ICD-10-CM | POA: Diagnosis present

## 2020-11-23 DIAGNOSIS — Z8673 Personal history of transient ischemic attack (TIA), and cerebral infarction without residual deficits: Secondary | ICD-10-CM

## 2020-11-23 DIAGNOSIS — D751 Secondary polycythemia: Secondary | ICD-10-CM

## 2020-11-23 DIAGNOSIS — I10 Essential (primary) hypertension: Secondary | ICD-10-CM

## 2020-11-23 DIAGNOSIS — D582 Other hemoglobinopathies: Secondary | ICD-10-CM

## 2020-11-23 LAB — CMP (CANCER CENTER ONLY)
ALT: 14 U/L (ref 0–44)
AST: 21 U/L (ref 15–41)
Albumin: 4.2 g/dL (ref 3.5–5.0)
Alkaline Phosphatase: 61 U/L (ref 38–126)
Anion gap: 5 (ref 5–15)
BUN: 8 mg/dL (ref 8–23)
CO2: 34 mmol/L — ABNORMAL HIGH (ref 22–32)
Calcium: 10.4 mg/dL — ABNORMAL HIGH (ref 8.9–10.3)
Chloride: 101 mmol/L (ref 98–111)
Creatinine: 0.91 mg/dL (ref 0.61–1.24)
GFR, Estimated: >60 mL/min (ref >60)
Glucose, Bld: 76 mg/dL (ref 70–99)
Potassium: 4.6 mmol/L (ref 3.5–5.1)
Sodium: 140 mmol/L (ref 135–145)
Total Bilirubin: 0.8 mg/dL (ref 0.3–1.2)
Total Protein: 6.7 g/dL (ref 6.5–8.1)

## 2020-11-23 LAB — CBC WITH DIFFERENTIAL (CANCER CENTER ONLY)
Abs Immature Granulocytes: 0.02 10*3/uL (ref 0.00–0.07)
Basophils Absolute: 0.1 10*3/uL (ref 0.0–0.1)
Basophils Relative: 2 %
Eosinophils Absolute: 0.4 10*3/uL (ref 0.0–0.5)
Eosinophils Relative: 7 %
HCT: 49.3 % (ref 39.0–52.0)
Hemoglobin: 17 g/dL (ref 13.0–17.0)
Immature Granulocytes: 0 %
Lymphocytes Relative: 26 %
Lymphs Abs: 1.7 10*3/uL (ref 0.7–4.0)
MCH: 32.8 pg (ref 26.0–34.0)
MCHC: 34.5 g/dL (ref 30.0–36.0)
MCV: 95 fL (ref 80.0–100.0)
Monocytes Absolute: 0.7 10*3/uL (ref 0.1–1.0)
Monocytes Relative: 11 %
Neutro Abs: 3.5 10*3/uL (ref 1.7–7.7)
Neutrophils Relative %: 54 %
Platelet Count: 215 10*3/uL (ref 150–400)
RBC: 5.19 MIL/uL (ref 4.22–5.81)
RDW: 11.7 % (ref 11.5–15.5)
WBC Count: 6.5 10*3/uL (ref 4.0–10.5)
nRBC: 0 % (ref 0.0–0.2)

## 2020-11-23 LAB — IRON AND TIBC
Iron: 202 ug/dL — ABNORMAL HIGH (ref 42–163)
Saturation Ratios: 51 % (ref 20–55)
TIBC: 395 ug/dL (ref 202–409)
UIBC: 193 ug/dL (ref 117–376)

## 2020-11-23 LAB — RETICULOCYTES
Immature Retic Fract: 8.5 % (ref 2.3–15.9)
RBC.: 5.13 MIL/uL (ref 4.22–5.81)
Retic Count, Absolute: 80 10*3/uL (ref 19.0–186.0)
Retic Ct Pct: 1.6 % (ref 0.4–3.1)

## 2020-11-23 LAB — FERRITIN: Ferritin: 188 ng/mL (ref 24–336)

## 2020-11-23 LAB — SAVE SMEAR(SSMR), FOR PROVIDER SLIDE REVIEW

## 2020-11-23 LAB — LACTATE DEHYDROGENASE: LDH: 157 U/L (ref 98–192)

## 2020-11-23 NOTE — Progress Notes (Signed)
Hematology/Oncology Consultation   Name: Drew [name]      MRN: [id number]    Location: Room/bed info not found  Date: 11/23/2020 Time:9:35 AM   REFERRING PHYSICIAN: Toy Baker, MD  REASON FOR CONSULT: Polycythemia vera   DIAGNOSIS: Erythrocytosis, lab work up pending  HISTORY OF PRESENT ILLNESS: Drew [name] is a very pleasant 64 yo caucasian gentleman with elevated Hgb for at least 6 years looking back at his available lab work (01/2014 - present).  He has history of HTN and a mini stroke in 2018. He is currently on full dose aspirin, pravachol and zestoretic. He was hospitalized in October with dizziness and BPPV. He is doing well since being home and has not had to take the [place]. His lab work during admission was concerning and he was referred here to hematology for further work up.  He is not a smoker. No testosterone/hormone replacement or work out supplement use.  No personal or familial history of liver disease.  He states that his father died from a heart attack and 61 yo. His mother passed away from CHF.   No history diabetes or thyroid disease. His most recent colonoscopy was in October 2018. He had a benign polyp removed.   No fever, chills, n/v, cough, rash, headaches, SOB, chest pain, palpitations, abdominal pain or changes in bowel or bladder habits.  No swelling, tenderness, numbness or tingling in his extremities.  No falls or syncope.  He has maintained a good appetite and is staying well hydrated. His weight is stable.  No smoking, ETOH or recreational drug use. He does chew tobacco.  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  He enjoys yard work and walks a mile every morning.   ROS: All other 10 point review of systems is negative.   PAST MEDICAL HISTORY:   Past Medical History:  Diagnosis Date  . CVA (cerebral vascular accident) ([place])   . High cholesterol   . Hypertension   . Memory loss   . Tubular adenoma  of colon     ALLERGIES: No Known Allergies    MEDICATIONS:  Current Outpatient Medications on File Prior to Visit  Medication Sig Dispense Refill  . aspirin EC 325 MG tablet Take 1 tablet (325 mg total) by mouth daily. 30 tablet 0  . Flaxseed, Linseed, (FLAXSEED OIL) 1200 MG CAPS Take 2,400 mg by mouth daily.     Marland Kitchen lisinopril-hydrochlorothiazide (ZESTORETIC) 20-12.5 MG tablet Take 2 tablets by mouth daily. Needs ov (Patient taking differently: Take 2 tablets by mouth in the morning.) 180 tablet 1  . Omega-3 Fatty Acids (FISH OIL) 1200 MG CAPS Take 1,200 mg by mouth 2 (two) times daily.     . pravastatin (PRAVACHOL) 40 MG tablet TAKE 1 TABLET(40 MG) BY MOUTH DAILY (Patient taking differently: Take 40 mg by mouth daily.) 90 tablet 1  . meclizine (ANTIVERT) 25 MG tablet Take 1 tablet (25 mg total) by mouth 3 (three) times daily as needed for dizziness. (Patient not taking: No sig reported) 30 tablet 0   No current facility-administered medications on file prior to visit.     PAST SURGICAL HISTORY Past Surgical History:  Procedure Laterality Date  . COLONOSCOPY    . HERNIA REPAIR Left 2004  . POLYPECTOMY      FAMILY HISTORY: Family History  Problem Relation Age of Onset  . Heart disease Mother        chf  .  Graves' disease Mother   . Heart disease Father   . Hyperlipidemia Brother   . Hypertension Brother   . Heart disease Paternal Uncle   . Heart disease Paternal Uncle   . Heart disease Paternal Uncle   . Heart disease Paternal Uncle   . Heart disease Paternal Uncle   . Heart disease Paternal Uncle   . Hypertension Brother   . Breast cancer Sister   . Colon cancer Neg Hx     SOCIAL HISTORY:  reports that he quit smoking about 32 years ago. His smoking use included cigarettes. He has a 15.00 pack-year smoking history. His smokeless tobacco use includes chew. He reports that he does not drink alcohol and does not use drugs.  PERFORMANCE STATUS: The patient's performance  status is 1 - Symptomatic but completely ambulatory  PHYSICAL EXAM: Most Recent Vital Signs: Blood pressure 121/80, pulse 62, temperature (!) 97.5 F (36.4 C), temperature source Oral, resp. rate 18, height 5\' 7"  (1.702 m), weight 150 lb 1.9 oz (68.1 kg), SpO2 99 %. BP 121/80 (BP Location: Left Arm, Patient Position: Sitting)   Pulse 62   Temp (!) 97.5 F (36.4 C) (Oral)   Resp 18   Ht 5\' 7"  (1.702 m)   Wt 150 lb 1.9 oz (68.1 kg)   SpO2 99%   BMI 23.51 kg/m   General Appearance:    Alert, cooperative, no distress, appears stated age  Head:    Normocephalic, without obvious abnormality, atraumatic  Eyes:    PERRL, conjunctiva/corneas clear, EOM's intact, fundi    benign, both eyes             Throat:   Lips, mucosa, and tongue normal; teeth and gums normal  Neck:   Supple, symmetrical, trachea midline, no adenopathy;       thyroid:  No enlargement/tenderness/nodules; no carotid   bruit or JVD  Back:     Symmetric, no curvature, ROM normal, no CVA tenderness  Lungs:     Clear to auscultation bilaterally, respirations unlabored  Chest wall:    No tenderness or deformity  Heart:    Regular rate and rhythm, S1 and S2 normal, no murmur, rub   or gallop  Abdomen:     Soft, non-tender, bowel sounds active all four quadrants,    no masses, no organomegaly        Extremities:   Extremities normal, atraumatic, no cyanosis or edema  Pulses:   2+ and symmetric all extremities  Skin:   Skin color, texture, turgor normal, no rashes or lesions  Lymph nodes:   Cervical, supraclavicular, and axillary nodes normal  Neurologic:   CNII-XII intact. Normal strength, sensation and reflexes      throughout    LABORATORY DATA:  Results for orders placed or performed in visit on 11/23/20 (from the past 48 hour(s))  Save Smear (SSMR)     Status: None   Collection Time: 11/23/20  8:56 AM  Result Value Ref Range   Smear Review SMEAR STAINED AND AVAILABLE FOR REVIEW     Comment: Performed at [facility], [address] [place], [place] [address]  CMP ([place] only)     Status: Abnormal   Collection Time: 11/23/20  8:56 AM  Result Value Ref Range   Sodium 140 135 - 145 mmol/L   Potassium 4.6 3.5 - 5.1 mmol/L   Chloride 101 98 - 111 mmol/L   CO2 34 (H) 22 -  32 mmol/L   Glucose, Bld 76 70 - 99 mg/dL    Comment: Glucose reference range applies only to samples taken after fasting for at least 8 hours.   BUN 8 8 - 23 mg/dL   Creatinine 0.91 0.61 - 1.24 mg/dL   Calcium 10.4 (H) 8.9 - 10.3 mg/dL   Total Protein 6.7 6.5 - 8.1 g/dL   Albumin 4.2 3.5 - 5.0 g/dL   AST 21 15 - 41 U/L   ALT 14 0 - 44 U/L   Alkaline Phosphatase 61 38 - 126 U/L   Total Bilirubin 0.8 0.3 - 1.2 mg/dL   GFR, Estimated >60 >60 mL/min    Comment: (NOTE) Calculated using the CKD-EPI Creatinine Equation (2021)    Anion gap 5 5 - 15    Comment: Performed at [facility], [place] [address], [place], [place] [address]  Reticulocytes     Status: None   Collection Time: 11/23/20  8:56 AM  Result Value Ref Range   Retic Ct Pct 1.6 0.4 - 3.1 %   RBC. 5.13 4.22 - 5.81 MIL/uL   Retic Count, Absolute 80.0 19.0 - 186.0 K/uL   Immature Retic Fract 8.5 2.3 - 15.9 %    Comment: Performed at [facility] at [facility], [address], [place], [place] [address]  CBC with Differential ([place] Only)     Status: None   Collection Time: 11/23/20  8:56 AM  Result Value Ref Range   WBC Count 6.5 4.0 - 10.5 K/uL   RBC 5.19 4.22 - 5.81 MIL/uL   Hemoglobin 17.0 13.0 - 17.0 g/dL   HCT 49.3 39.0 - 52.0 %   MCV 95.0 80.0 - 100.0 fL   MCH 32.8 26.0 - 34.0 pg   MCHC 34.5 30.0 - 36.0 g/dL   RDW 11.7 11.5 - 15.5 %   Platelet Count 215 150 - 400 K/uL   nRBC 0.0 0.0 - 0.2 %   Neutrophils Relative % 54 %   Neutro Abs 3.5 1.7 - 7.7 K/uL   Lymphocytes Relative 26 %   Lymphs Abs 1.7 0.7 - 4.0 K/uL   Monocytes Relative 11 %    Monocytes Absolute 0.7 0.1 - 1.0 K/uL   Eosinophils Relative 7 %   Eosinophils Absolute 0.4 0.0 - 0.5 K/uL   Basophils Relative 2 %   Basophils Absolute 0.1 0.0 - 0.1 K/uL   Immature Granulocytes 0 %   Abs Immature Granulocytes 0.02 0.00 - 0.07 K/uL    Comment: Performed at [facility] at [facility], [address], [place], [place] [address]      RADIOGRAPHY: No results found.     PATHOLOGY: None  ASSESSMENT/PLAN: Mr. Cordaro is a very pleasant 64 yo caucasian gentleman with elevated Hgb for at least 6 years looking back at his available lab work (01/2014 - present).  We will work him up for cause of erythrocytosis.  JAK2, iron studies and epo level are pending. Once results are available we will go over with her and his wife in detail and determine if treatment plan is needed.  We will follow-up in 8 weeks.   All questions were answered and he is in agreement with the plan. He was encouraged to contact our office with any questions or concerns. We can certainly see him sooner if needed.   Laverna Peace, NP

## 2020-11-23 NOTE — Telephone Encounter (Signed)
Appointments scheduled calendar mailed per 12/15 los

## 2020-11-24 LAB — ERYTHROPOIETIN: Erythropoietin: 9.1 m[IU]/mL (ref 2.6–18.5)

## 2020-12-06 ENCOUNTER — Other Ambulatory Visit: Payer: Self-pay | Admitting: Hematology & Oncology

## 2020-12-06 ENCOUNTER — Other Ambulatory Visit: Payer: Self-pay | Admitting: Family

## 2020-12-06 LAB — JAK 2 V617F (GENPATH)

## 2020-12-07 ENCOUNTER — Telehealth: Payer: Self-pay | Admitting: *Deleted

## 2020-12-07 ENCOUNTER — Telehealth: Payer: Self-pay

## 2020-12-07 NOTE — Telephone Encounter (Signed)
Unable to get in touch with patient with listed phone numbers. Message left on pt.'s wife's phone to notify her per order of S. [place] NP that "his JAK 2 testing was normal!!! His iron studies were borderline so Maralyn Sago would like to do the hemochromatosis DNA lab work on him just to be thorough."  Instructed pt.'s wife to call office back to confirm receipt of this message.

## 2020-12-07 NOTE — Telephone Encounter (Signed)
S/w wife to make lab appt per 12/06/20 inbasket message   aom

## 2020-12-08 ENCOUNTER — Other Ambulatory Visit: Payer: Self-pay

## 2020-12-08 ENCOUNTER — Inpatient Hospital Stay: Payer: Medicare Other

## 2020-12-08 DIAGNOSIS — D45 Polycythemia vera: Secondary | ICD-10-CM | POA: Diagnosis not present

## 2020-12-13 LAB — HEMOCHROMATOSIS DNA-PCR(C282Y,H63D)

## 2020-12-15 ENCOUNTER — Other Ambulatory Visit: Payer: Self-pay | Admitting: Family

## 2020-12-16 ENCOUNTER — Telehealth: Payer: Self-pay | Admitting: Family

## 2020-12-16 NOTE — Telephone Encounter (Signed)
Patient at work. I was able to speak with his wife and go over lab results (hemochromatosis diagnosis) and plan for 1 phlebotomy next week. She verbalized understanding. No questions at this time. They will call back with any questions if needed once he gets home and she is able to discuss with him.  She was appreciative of the call. Scheduler notified.

## 2020-12-20 ENCOUNTER — Telehealth: Payer: Self-pay

## 2020-12-20 NOTE — Telephone Encounter (Signed)
Pt had called and left a vm x2 on 12/19/20 to possibly r/s his 12/23/20 appt, called pt back and he states he has it worked out and no longer needs to r/s    aom

## 2020-12-23 ENCOUNTER — Other Ambulatory Visit: Payer: Self-pay

## 2020-12-23 ENCOUNTER — Inpatient Hospital Stay: Payer: Medicare Other | Attending: Hematology & Oncology

## 2020-12-23 NOTE — Progress Notes (Signed)
One unit phlebotomy done, patient tolerated well, refreshments given. VV Stable upon discharge

## 2021-01-24 ENCOUNTER — Other Ambulatory Visit: Payer: Self-pay

## 2021-01-24 ENCOUNTER — Inpatient Hospital Stay: Payer: Medicare Other | Attending: Hematology & Oncology

## 2021-01-24 ENCOUNTER — Inpatient Hospital Stay (HOSPITAL_BASED_OUTPATIENT_CLINIC_OR_DEPARTMENT_OTHER): Payer: Medicare Other | Admitting: Family

## 2021-01-24 ENCOUNTER — Encounter: Payer: Self-pay | Admitting: Family

## 2021-01-24 ENCOUNTER — Telehealth: Payer: Self-pay

## 2021-01-24 DIAGNOSIS — D582 Other hemoglobinopathies: Secondary | ICD-10-CM

## 2021-01-24 LAB — IRON AND TIBC
Iron: 93 ug/dL (ref 42–163)
Saturation Ratios: 25 % (ref 20–55)
TIBC: 373 ug/dL (ref 202–409)
UIBC: 280 ug/dL (ref 117–376)

## 2021-01-24 LAB — CMP (CANCER CENTER ONLY)
ALT: 12 U/L (ref 0–44)
AST: 18 U/L (ref 15–41)
Albumin: 4 g/dL (ref 3.5–5.0)
Alkaline Phosphatase: 62 U/L (ref 38–126)
Anion gap: 5 (ref 5–15)
BUN: 11 mg/dL (ref 8–23)
CO2: 33 mmol/L — ABNORMAL HIGH (ref 22–32)
Calcium: 9.9 mg/dL (ref 8.9–10.3)
Chloride: 101 mmol/L (ref 98–111)
Creatinine: 0.93 mg/dL (ref 0.61–1.24)
GFR, Estimated: >60 mL/min (ref >60)
Glucose, Bld: 85 mg/dL (ref 70–99)
Potassium: 4.7 mmol/L (ref 3.5–5.1)
Sodium: 139 mmol/L (ref 135–145)
Total Bilirubin: 0.5 mg/dL (ref 0.3–1.2)
Total Protein: 6.4 g/dL — ABNORMAL LOW (ref 6.5–8.1)

## 2021-01-24 LAB — CBC WITH DIFFERENTIAL (CANCER CENTER ONLY)
Abs Immature Granulocytes: 0.03 10*3/uL (ref 0.00–0.07)
Basophils Absolute: 0.1 10*3/uL (ref 0.0–0.1)
Basophils Relative: 1 %
Eosinophils Absolute: 0.3 10*3/uL (ref 0.0–0.5)
Eosinophils Relative: 5 %
HCT: 46.2 % (ref 39.0–52.0)
Hemoglobin: 16 g/dL (ref 13.0–17.0)
Immature Granulocytes: 0 %
Lymphocytes Relative: 24 %
Lymphs Abs: 1.7 10*3/uL (ref 0.7–4.0)
MCH: 32.5 pg (ref 26.0–34.0)
MCHC: 34.6 g/dL (ref 30.0–36.0)
MCV: 93.7 fL (ref 80.0–100.0)
Monocytes Absolute: 0.6 10*3/uL (ref 0.1–1.0)
Monocytes Relative: 9 %
Neutro Abs: 4.3 10*3/uL (ref 1.7–7.7)
Neutrophils Relative %: 61 %
Platelet Count: 266 10*3/uL (ref 150–400)
RBC: 4.93 MIL/uL (ref 4.22–5.81)
RDW: 11 % — ABNORMAL LOW (ref 11.5–15.5)
WBC Count: 7.1 10*3/uL (ref 4.0–10.5)
nRBC: 0 % (ref 0.0–0.2)

## 2021-01-24 LAB — LACTATE DEHYDROGENASE: LDH: 155 U/L (ref 98–192)

## 2021-01-24 LAB — FERRITIN: Ferritin: 145 ng/mL (ref 24–336)

## 2021-01-24 NOTE — Telephone Encounter (Signed)
S/w pt per 01/24/21 los appts and he states that he will view appts in my chart   Genene Kilman

## 2021-01-24 NOTE — Progress Notes (Signed)
Hematology and Oncology Follow Up Visit  Drew [name] [id number] 18-Jun-1956 65 y.o. 01/24/2021   Principle Diagnosis:  Hemochromatosis, heterozygous for the H63D mutation  Current Therapy:   Phlebotomy to maintain ferritin < 100 and iron saturation < 50%   Interim History:  Drew [name] is here today with his wife for follow-up. He is ding quite well and tolerated his phlebotomy in January nicely.  Iron saturation at that times was 51% and ferritin 188.  He denies fatigue.  No fever, chills, hot flashes, night sweats, n/v, cough, rash, dizziness, headaches, SOB, chest pain, palpitations, abdominal pain or changes in bowel or bladder habits.  No swelling, tenderness, numbness or tingling in his extremities.  No falls or syncope.  He has maintained a good appetite and is staying well hydrated. His weight is stable at 154 lbs.  ECOG Performance Status: 0 - Asymptomatic  Medications:  Allergies as of 01/24/2021   No Known Allergies     Medication List       Accurate as of January 24, 2021  9:15 AM. If you have any questions, ask your nurse or doctor.        aspirin EC 325 MG tablet Take 1 tablet (325 mg total) by mouth daily.   Fish Oil 1200 MG Caps Take 1,200 mg by mouth 2 (two) times daily.   Flaxseed Oil 1200 MG Caps Take 2,400 mg by mouth daily.   lisinopril-hydrochlorothiazide 20-12.5 MG tablet Commonly known as: ZESTORETIC Take 2 tablets by mouth daily. Needs ov What changed:   when to take this  additional instructions   meclizine 25 MG tablet Commonly known as: ANTIVERT Take 1 tablet (25 mg total) by mouth 3 (three) times daily as needed for dizziness.   pravastatin 40 MG tablet Commonly known as: PRAVACHOL TAKE 1 TABLET(40 MG) BY MOUTH DAILY What changed: See the new instructions.       Allergies: No Known Allergies  Past Medical History, Surgical history, Social history, and Family History were reviewed and updated.  Review of Systems: All  other 10 point review of systems is negative.   Physical Exam:  height is 5\' 7"  (1.702 m) and weight is 154 lb (69.9 kg). His oral temperature is 98.1 F (36.7 C). His blood pressure is 135/79 and his pulse is 63. His respiration is 17 and oxygen saturation is 100%.   Wt Readings from Last 3 Encounters:  01/24/21 154 lb (69.9 kg)  11/23/20 150 lb 1.9 oz (68.1 kg)  10/20/20 152 lb 12.8 oz (69.3 kg)    Ocular: Sclerae unicteric, pupils equal, round and reactive to light Ear-nose-throat: Oropharynx clear, dentition fair Lymphatic: No cervical or supraclavicular adenopathy Lungs no rales or rhonchi, good excursion bilaterally Heart regular rate and rhythm, no murmur appreciated Abd soft, nontender, positive bowel sounds MSK no focal spinal tenderness, no joint edema Neuro: non-focal, well-oriented, appropriate affect Breasts: Deferred   Lab Results  Component Value Date   WBC 7.1 01/24/2021   HGB 16.0 01/24/2021   HCT 46.2 01/24/2021   MCV 93.7 01/24/2021   PLT 266 01/24/2021   Lab Results  Component Value Date   FERRITIN 188 11/23/2020   IRON 202 (H) 11/23/2020   TIBC 395 11/23/2020   UIBC 193 11/23/2020   IRONPCTSAT 51 11/23/2020   Lab Results  Component Value Date   RETICCTPCT 1.6 11/23/2020   RBC 4.93 01/24/2021   No results found for: KPAFRELGTCHN, LAMBDASER, KAPLAMBRATIO No results found for: IGGSERUM, IGA, IGMSERUM No results  found for: Odetta Pink, SPEI   Chemistry      Component Value Date/Time   NA 140 11/23/2020 0856   K 4.6 11/23/2020 0856   CL 101 11/23/2020 0856   CO2 34 (H) 11/23/2020 0856   BUN 8 11/23/2020 0856   CREATININE 0.91 11/23/2020 0856   CREATININE 0.91 10/06/2020 1536      Component Value Date/Time   CALCIUM 10.4 (H) 11/23/2020 0856   ALKPHOS 61 11/23/2020 0856   AST 21 11/23/2020 0856   ALT 14 11/23/2020 0856   BILITOT 0.8 11/23/2020 0856       Impression and Plan: [name] is a very pleasant 65 yo caucasian gentleman with hemochromatosis, heterzygous for the H63D mutation.  He tolerated his phlebotomy last month nicely.  Iron studies are pending for today and we will see if he needs another phlebotomy this week.  We will check lab work monthly and follow-up in 3 months.  They were encouraged to contact our office with any questions or concerns.   Laverna Peace, NP 2/15/20229:15 AM

## 2021-01-26 ENCOUNTER — Telehealth: Payer: Self-pay

## 2021-01-26 NOTE — Telephone Encounter (Signed)
Pt called in and made his phlebot appt as req per sch/inbasket message    Drew [name]

## 2021-01-31 ENCOUNTER — Other Ambulatory Visit: Payer: Self-pay

## 2021-01-31 ENCOUNTER — Inpatient Hospital Stay: Payer: Medicare Other

## 2021-01-31 NOTE — Progress Notes (Signed)
Drew [name] presents today for phlebotomy per MD orders. Mini-Phlebotomy procedure started at 1530 and ended at 1533. 300 grams removed from rt [facility] using 16g phlebotomy kit.  Patient observed for 30 minutes after procedure without any incident. Patient tolerated procedure well. IV needle removed intact.

## 2021-01-31 NOTE — Patient Instructions (Signed)

## 2021-02-21 ENCOUNTER — Other Ambulatory Visit: Payer: Self-pay

## 2021-02-21 ENCOUNTER — Inpatient Hospital Stay: Payer: Medicare Other | Attending: Hematology & Oncology

## 2021-02-21 LAB — CMP (CANCER CENTER ONLY)
ALT: 12 U/L (ref 0–44)
AST: 19 U/L (ref 15–41)
Albumin: 4.1 g/dL (ref 3.5–5.0)
Alkaline Phosphatase: 51 U/L (ref 38–126)
Anion gap: 4 — ABNORMAL LOW (ref 5–15)
BUN: 9 mg/dL (ref 8–23)
CO2: 33 mmol/L — ABNORMAL HIGH (ref 22–32)
Calcium: 9.8 mg/dL (ref 8.9–10.3)
Chloride: 100 mmol/L (ref 98–111)
Creatinine: 1 mg/dL (ref 0.61–1.24)
GFR, Estimated: >60 mL/min (ref >60)
Glucose, Bld: 76 mg/dL (ref 70–99)
Potassium: 4.3 mmol/L (ref 3.5–5.1)
Sodium: 137 mmol/L (ref 135–145)
Total Bilirubin: 0.6 mg/dL (ref 0.3–1.2)
Total Protein: 6.3 g/dL — ABNORMAL LOW (ref 6.5–8.1)

## 2021-02-21 LAB — CBC WITH DIFFERENTIAL (CANCER CENTER ONLY)
Abs Immature Granulocytes: 0.01 10*3/uL (ref 0.00–0.07)
Basophils Absolute: 0.1 10*3/uL (ref 0.0–0.1)
Basophils Relative: 2 %
Eosinophils Absolute: 0.3 10*3/uL (ref 0.0–0.5)
Eosinophils Relative: 5 %
HCT: 44.4 % (ref 39.0–52.0)
Hemoglobin: 15.3 g/dL (ref 13.0–17.0)
Immature Granulocytes: 0 %
Lymphocytes Relative: 23 %
Lymphs Abs: 1.5 10*3/uL (ref 0.7–4.0)
MCH: 32.3 pg (ref 26.0–34.0)
MCHC: 34.5 g/dL (ref 30.0–36.0)
MCV: 93.7 fL (ref 80.0–100.0)
Monocytes Absolute: 0.6 10*3/uL (ref 0.1–1.0)
Monocytes Relative: 9 %
Neutro Abs: 3.9 10*3/uL (ref 1.7–7.7)
Neutrophils Relative %: 61 %
Platelet Count: 213 10*3/uL (ref 150–400)
RBC: 4.74 MIL/uL (ref 4.22–5.81)
RDW: 11.7 % (ref 11.5–15.5)
WBC Count: 6.5 10*3/uL (ref 4.0–10.5)
nRBC: 0 % (ref 0.0–0.2)

## 2021-02-21 LAB — IRON AND TIBC
Iron: 99 ug/dL (ref 42–163)
Saturation Ratios: 25 % (ref 20–55)
TIBC: 395 ug/dL (ref 202–409)
UIBC: 296 ug/dL (ref 117–376)

## 2021-02-21 LAB — FERRITIN: Ferritin: 78 ng/mL (ref 24–336)

## 2021-02-22 ENCOUNTER — Telehealth: Payer: Self-pay | Admitting: *Deleted

## 2021-02-22 NOTE — Telephone Encounter (Signed)
Patient notified that "no phlebotomy is needed at this time!" per order of S. [place] NP.  Pt is appreciative of call and has no questions or concerns at this time.

## 2021-02-22 NOTE — Telephone Encounter (Signed)
-----   Message from Eliezer Bottom, NP sent at 02/21/2021  4:35 PM EDT ----- No phlebotomy needed at this time!   ----- Message ----- From: [place]: 02/21/2021   9:07 AM EDT To: Eliezer Bottom, NP

## 2021-03-17 ENCOUNTER — Telehealth: Payer: Self-pay

## 2021-03-17 NOTE — Telephone Encounter (Signed)
pts wife called and r/s his appts to later times       Drew [name] 

## 2021-03-21 ENCOUNTER — Inpatient Hospital Stay: Payer: Medicare Other | Attending: Hematology & Oncology

## 2021-03-21 ENCOUNTER — Other Ambulatory Visit: Payer: 59

## 2021-03-21 ENCOUNTER — Other Ambulatory Visit: Payer: Self-pay

## 2021-03-21 LAB — CMP (CANCER CENTER ONLY)
ALT: 14 U/L (ref 0–44)
AST: 19 U/L (ref 15–41)
Albumin: 4 g/dL (ref 3.5–5.0)
Alkaline Phosphatase: 52 U/L (ref 38–126)
Anion gap: 5 (ref 5–15)
BUN: 13 mg/dL (ref 8–23)
CO2: 32 mmol/L (ref 22–32)
Calcium: 9.9 mg/dL (ref 8.9–10.3)
Chloride: 100 mmol/L (ref 98–111)
Creatinine: 1.07 mg/dL (ref 0.61–1.24)
GFR, Estimated: >60 mL/min (ref >60)
Glucose, Bld: 94 mg/dL (ref 70–99)
Potassium: 4.8 mmol/L (ref 3.5–5.1)
Sodium: 137 mmol/L (ref 135–145)
Total Bilirubin: 0.7 mg/dL (ref 0.3–1.2)
Total Protein: 6.4 g/dL — ABNORMAL LOW (ref 6.5–8.1)

## 2021-03-21 LAB — CBC WITH DIFFERENTIAL (CANCER CENTER ONLY)
Abs Immature Granulocytes: 0.02 10*3/uL (ref 0.00–0.07)
Basophils Absolute: 0.1 10*3/uL (ref 0.0–0.1)
Basophils Relative: 1 %
Eosinophils Absolute: 0.2 10*3/uL (ref 0.0–0.5)
Eosinophils Relative: 3 %
HCT: 45.2 % (ref 39.0–52.0)
Hemoglobin: 15.7 g/dL (ref 13.0–17.0)
Immature Granulocytes: 0 %
Lymphocytes Relative: 23 %
Lymphs Abs: 1.6 10*3/uL (ref 0.7–4.0)
MCH: 32.4 pg (ref 26.0–34.0)
MCHC: 34.7 g/dL (ref 30.0–36.0)
MCV: 93.2 fL (ref 80.0–100.0)
Monocytes Absolute: 0.7 10*3/uL (ref 0.1–1.0)
Monocytes Relative: 10 %
Neutro Abs: 4.4 10*3/uL (ref 1.7–7.7)
Neutrophils Relative %: 63 %
Platelet Count: 223 10*3/uL (ref 150–400)
RBC: 4.85 MIL/uL (ref 4.22–5.81)
RDW: 11.5 % (ref 11.5–15.5)
WBC Count: 7 10*3/uL (ref 4.0–10.5)
nRBC: 0 % (ref 0.0–0.2)

## 2021-03-22 LAB — IRON AND TIBC
Iron: 110 ug/dL (ref 42–163)
Saturation Ratios: 28 % (ref 20–55)
TIBC: 391 ug/dL (ref 202–409)
UIBC: 281 ug/dL (ref 117–376)

## 2021-03-22 LAB — FERRITIN: Ferritin: 81 ng/mL (ref 24–336)

## 2021-04-10 ENCOUNTER — Other Ambulatory Visit: Payer: Self-pay | Admitting: Family Medicine

## 2021-04-10 DIAGNOSIS — I10 Essential (primary) hypertension: Secondary | ICD-10-CM

## 2021-04-10 DIAGNOSIS — E785 Hyperlipidemia, unspecified: Secondary | ICD-10-CM

## 2021-04-18 ENCOUNTER — Ambulatory Visit: Payer: 59 | Admitting: Family

## 2021-04-18 ENCOUNTER — Other Ambulatory Visit: Payer: 59

## 2021-04-26 ENCOUNTER — Inpatient Hospital Stay: Payer: Medicare Other | Attending: Hematology & Oncology

## 2021-04-26 ENCOUNTER — Other Ambulatory Visit: Payer: Self-pay

## 2021-04-26 ENCOUNTER — Inpatient Hospital Stay (HOSPITAL_BASED_OUTPATIENT_CLINIC_OR_DEPARTMENT_OTHER): Payer: Medicare Other | Admitting: Family

## 2021-04-26 ENCOUNTER — Encounter: Payer: Self-pay | Admitting: Family

## 2021-04-26 LAB — CMP (CANCER CENTER ONLY)
ALT: 10 U/L (ref 0–44)
AST: 17 U/L (ref 15–41)
Albumin: 3.8 g/dL (ref 3.5–5.0)
Alkaline Phosphatase: 55 U/L (ref 38–126)
Anion gap: 4 — ABNORMAL LOW (ref 5–15)
BUN: 12 mg/dL (ref 8–23)
CO2: 32 mmol/L (ref 22–32)
Calcium: 9.6 mg/dL (ref 8.9–10.3)
Chloride: 102 mmol/L (ref 98–111)
Creatinine: 0.94 mg/dL (ref 0.61–1.24)
GFR, Estimated: >60 mL/min (ref >60)
Glucose, Bld: 112 mg/dL — ABNORMAL HIGH (ref 70–99)
Potassium: 4.4 mmol/L (ref 3.5–5.1)
Sodium: 138 mmol/L (ref 135–145)
Total Bilirubin: 0.6 mg/dL (ref 0.3–1.2)
Total Protein: 6.3 g/dL — ABNORMAL LOW (ref 6.5–8.1)

## 2021-04-26 LAB — CBC WITH DIFFERENTIAL (CANCER CENTER ONLY)
Abs Immature Granulocytes: 0.02 10*3/uL (ref 0.00–0.07)
Basophils Absolute: 0.1 10*3/uL (ref 0.0–0.1)
Basophils Relative: 1 %
Eosinophils Absolute: 0.5 10*3/uL (ref 0.0–0.5)
Eosinophils Relative: 6 %
HCT: 44.6 % (ref 39.0–52.0)
Hemoglobin: 15.3 g/dL (ref 13.0–17.0)
Immature Granulocytes: 0 %
Lymphocytes Relative: 24 %
Lymphs Abs: 1.9 10*3/uL (ref 0.7–4.0)
MCH: 31.9 pg (ref 26.0–34.0)
MCHC: 34.3 g/dL (ref 30.0–36.0)
MCV: 93.1 fL (ref 80.0–100.0)
Monocytes Absolute: 0.7 10*3/uL (ref 0.1–1.0)
Monocytes Relative: 9 %
Neutro Abs: 4.6 10*3/uL (ref 1.7–7.7)
Neutrophils Relative %: 60 %
Platelet Count: 219 10*3/uL (ref 150–400)
RBC: 4.79 MIL/uL (ref 4.22–5.81)
RDW: 11.8 % (ref 11.5–15.5)
WBC Count: 7.7 10*3/uL (ref 4.0–10.5)
nRBC: 0 % (ref 0.0–0.2)

## 2021-04-26 NOTE — Progress Notes (Signed)
Hematology and Oncology Follow Up Visit  Drew [name] [id number] Feb 02, 1956 65 y.o. 04/26/2021   Principle Diagnosis:  Hemochromatosis, heterozygous for the H63D mutation  Current Therapy:        Phlebotomy to maintain ferritin < 100 and iron saturation < 50%   Interim History:  Drew [name] is here today with his wife for follow-up. He is doing quite well and has no complaints at this time.  Hgb is 15.3, MCV 93, platelets 219 and WBC count 7.7. Iron studies are pending.  No fever, chills, n/v, cough, rash, dizziness, SOB, chest pain, palpitations, abdominal pain or changes in bowel or bladder habits.  No episodes of bleeding. No bruising or petechiae.  No swelling, tenderness, numbness or tingling in her extremities.  No falls or syncope.  Drew [name] has maintained a good appetite and is staying well hydrated. Her weight is 151 lbs.   ECOG Performance Status: 1 - Symptomatic but completely ambulatory  Medications:  Allergies as of 04/26/2021   No Known Allergies     Medication List       Accurate as of Apr 26, 2021  3:55 PM. If you have any questions, ask your nurse or doctor.        aspirin EC 325 MG tablet Take 1 tablet (325 mg total) by mouth daily.   Fish Oil 1200 MG Caps Take 1,200 mg by mouth 2 (two) times daily.   Flaxseed Oil 1200 MG Caps Take 2,400 mg by mouth daily.   lisinopril-hydrochlorothiazide 20-12.5 MG tablet Commonly known as: ZESTORETIC TAKE 2 TABLETS EVERY DAY   meclizine 25 MG tablet Commonly known as: ANTIVERT TAKE 1 TABLET (25 MG TOTAL) BY MOUTH THREE TIMES DAILY AS NEEDED FOR DIZZINESS.   pravastatin 40 MG tablet Commonly known as: PRAVACHOL TAKE 1 TABLET EVERY DAY       Allergies: No Known Allergies  Past Medical History, Surgical history, Social history, and Family History were reviewed and updated.  Review of Systems: All other 10 point review of systems is negative.   Physical Exam:  height is 5\' 7"  (1.702 m) and weight is 151 lb  1.9 oz (68.5 kg). His oral temperature is 98.3 F (36.8 C). His blood pressure is 105/60 and his pulse is 74. His respiration is 18 and oxygen saturation is 99%.   Wt Readings from Last 3 Encounters:  04/26/21 151 lb 1.9 oz (68.5 kg)  01/24/21 154 lb (69.9 kg)  11/23/20 150 lb 1.9 oz (68.1 kg)    Ocular: Sclerae unicteric, pupils equal, round and reactive to light Ear-nose-throat: Oropharynx clear, dentition fair Lymphatic: No cervical or supraclavicular adenopathy Lungs no rales or rhonchi, good excursion bilaterally Heart regular rate and rhythm, no murmur appreciated Abd soft, nontender, positive bowel sounds MSK no focal spinal tenderness, no joint edema Neuro: non-focal, well-oriented, appropriate affect Breasts: Deferred   Lab Results  Component Value Date   WBC 7.7 04/26/2021   HGB 15.3 04/26/2021   HCT 44.6 04/26/2021   MCV 93.1 04/26/2021   PLT 219 04/26/2021   Lab Results  Component Value Date   FERRITIN 81 03/21/2021   IRON 110 03/21/2021   TIBC 391 03/21/2021   UIBC 281 03/21/2021   IRONPCTSAT 28 03/21/2021   Lab Results  Component Value Date   RETICCTPCT 1.6 11/23/2020   RBC 4.79 04/26/2021   No results found for: KPAFRELGTCHN, LAMBDASER, KAPLAMBRATIO No results found for: IGGSERUM, IGA, IGMSERUM No results found for: TOTALPROTELP, ALBUMINELP, A1GS, A2GS, BETS, [place], [place], MSPIKE,  SPEI   Chemistry      Component Value Date/Time   NA 138 04/26/2021 1511   K 4.4 04/26/2021 1511   CL 102 04/26/2021 1511   CO2 32 04/26/2021 1511   BUN 12 04/26/2021 1511   CREATININE 0.94 04/26/2021 1511   CREATININE 0.91 10/06/2020 1536      Component Value Date/Time   CALCIUM 9.6 04/26/2021 1511   ALKPHOS 55 04/26/2021 1511   AST 17 04/26/2021 1511   ALT 10 04/26/2021 1511   BILITOT 0.6 04/26/2021 1511       Impression and Plan: [name] is a very pleasant 65 yo caucasian gentleman with hemochromatosis, heterzygous for the H63D mutation.  His iron  studies have remained stable. Iron saturation is 21% and ferritin 54.  We will check labs every 8 weeks and follow-up in 4 months.  He can contact our office with any questions or concerns.   Laverna Peace, NP 5/18/20223:55 PM

## 2021-04-27 ENCOUNTER — Telehealth: Payer: Self-pay | Admitting: *Deleted

## 2021-04-27 LAB — IRON AND TIBC
Iron: 80 ug/dL (ref 42–163)
Saturation Ratios: 21 % (ref 20–55)
TIBC: 390 ug/dL (ref 202–409)
UIBC: 310 ug/dL (ref 117–376)

## 2021-04-27 LAB — FERRITIN: Ferritin: 54 ng/mL (ref 24–336)

## 2021-04-27 NOTE — Telephone Encounter (Signed)
Per 04/26/21 los called and gave upcoming appointments - mailed calendar

## 2021-04-27 NOTE — Telephone Encounter (Signed)
No 04/26/21 los 

## 2021-06-26 ENCOUNTER — Other Ambulatory Visit: Payer: Self-pay

## 2021-06-26 ENCOUNTER — Other Ambulatory Visit: Payer: Medicare Other

## 2021-06-26 ENCOUNTER — Inpatient Hospital Stay: Payer: Medicare Other | Attending: Hematology & Oncology

## 2021-06-26 LAB — CBC WITH DIFFERENTIAL (CANCER CENTER ONLY)
Abs Immature Granulocytes: 0.02 10*3/uL (ref 0.00–0.07)
Basophils Absolute: 0.1 10*3/uL (ref 0.0–0.1)
Basophils Relative: 1 %
Eosinophils Absolute: 0.2 10*3/uL (ref 0.0–0.5)
Eosinophils Relative: 3 %
HCT: 46 % (ref 39.0–52.0)
Hemoglobin: 16 g/dL (ref 13.0–17.0)
Immature Granulocytes: 0 %
Lymphocytes Relative: 22 %
Lymphs Abs: 1.5 10*3/uL (ref 0.7–4.0)
MCH: 32.3 pg (ref 26.0–34.0)
MCHC: 34.8 g/dL (ref 30.0–36.0)
MCV: 92.9 fL (ref 80.0–100.0)
Monocytes Absolute: 0.5 10*3/uL (ref 0.1–1.0)
Monocytes Relative: 7 %
Neutro Abs: 4.6 10*3/uL (ref 1.7–7.7)
Neutrophils Relative %: 67 %
Platelet Count: 208 10*3/uL (ref 150–400)
RBC: 4.95 MIL/uL (ref 4.22–5.81)
RDW: 12 % (ref 11.5–15.5)
WBC Count: 7 10*3/uL (ref 4.0–10.5)
nRBC: 0 % (ref 0.0–0.2)

## 2021-06-26 LAB — CMP (CANCER CENTER ONLY)
ALT: 13 U/L (ref 0–44)
AST: 18 U/L (ref 15–41)
Albumin: 4.1 g/dL (ref 3.5–5.0)
Alkaline Phosphatase: 57 U/L (ref 38–126)
Anion gap: 6 (ref 5–15)
BUN: 8 mg/dL (ref 8–23)
CO2: 33 mmol/L — ABNORMAL HIGH (ref 22–32)
Calcium: 10.2 mg/dL (ref 8.9–10.3)
Chloride: 101 mmol/L (ref 98–111)
Creatinine: 0.98 mg/dL (ref 0.61–1.24)
GFR, Estimated: >60 mL/min (ref >60)
Glucose, Bld: 93 mg/dL (ref 70–99)
Potassium: 4.8 mmol/L (ref 3.5–5.1)
Sodium: 140 mmol/L (ref 135–145)
Total Bilirubin: 0.7 mg/dL (ref 0.3–1.2)
Total Protein: 6.9 g/dL (ref 6.5–8.1)

## 2021-06-27 ENCOUNTER — Telehealth: Payer: Self-pay | Admitting: *Deleted

## 2021-06-27 LAB — IRON AND TIBC
Iron: 118 ug/dL (ref 42–163)
Saturation Ratios: 30 % (ref 20–55)
TIBC: 395 ug/dL (ref 202–409)
UIBC: 277 ug/dL (ref 117–376)

## 2021-06-27 LAB — FERRITIN: Ferritin: 65 ng/mL (ref 24–336)

## 2021-06-27 NOTE — Telephone Encounter (Signed)
As noted below by Laverna Peace, NP, I informed his wife that no phlebotomy is needed and his iron studies still look good. She verbalized understanding.

## 2021-06-27 NOTE — Telephone Encounter (Signed)
-----   Message from Eliezer Bottom, NP sent at 06/27/2021  1:08 PM EDT ----- No phlebotomy needed. Iron studies still look good!   ----- Message ----- From: Interface, Lab In [facility] Sent: 06/26/2021   3:20 PM EDT To: Eliezer Bottom, NP

## 2021-07-06 ENCOUNTER — Other Ambulatory Visit: Payer: Self-pay | Admitting: Family Medicine

## 2021-07-06 DIAGNOSIS — I10 Essential (primary) hypertension: Secondary | ICD-10-CM

## 2021-07-06 DIAGNOSIS — E785 Hyperlipidemia, unspecified: Secondary | ICD-10-CM

## 2021-08-28 ENCOUNTER — Other Ambulatory Visit: Payer: Self-pay

## 2021-08-28 ENCOUNTER — Inpatient Hospital Stay: Payer: Medicare Other | Attending: Hematology & Oncology

## 2021-08-28 ENCOUNTER — Telehealth: Payer: Self-pay

## 2021-08-28 ENCOUNTER — Inpatient Hospital Stay (HOSPITAL_BASED_OUTPATIENT_CLINIC_OR_DEPARTMENT_OTHER): Payer: Medicare Other | Admitting: Hematology & Oncology

## 2021-08-28 ENCOUNTER — Encounter: Payer: Self-pay | Admitting: Hematology & Oncology

## 2021-08-28 VITALS — BP 107/75 | HR 80 | Temp 98.9°F | Resp 20 | Wt 151.6 lb

## 2021-08-28 DIAGNOSIS — R2231 Localized swelling, mass and lump, right upper limb: Secondary | ICD-10-CM | POA: Insufficient documentation

## 2021-08-28 DIAGNOSIS — Z79899 Other long term (current) drug therapy: Secondary | ICD-10-CM | POA: Insufficient documentation

## 2021-08-28 DIAGNOSIS — M791 Myalgia, unspecified site: Secondary | ICD-10-CM | POA: Diagnosis not present

## 2021-08-28 HISTORY — DX: Hereditary hemochromatosis: E83.110

## 2021-08-28 LAB — CMP (CANCER CENTER ONLY)
ALT: 11 U/L (ref 0–44)
AST: 18 U/L (ref 15–41)
Albumin: 3.9 g/dL (ref 3.5–5.0)
Alkaline Phosphatase: 53 U/L (ref 38–126)
Anion gap: 7 (ref 5–15)
BUN: 7 mg/dL — ABNORMAL LOW (ref 8–23)
CO2: 31 mmol/L (ref 22–32)
Calcium: 9.4 mg/dL (ref 8.9–10.3)
Chloride: 101 mmol/L (ref 98–111)
Creatinine: 0.93 mg/dL (ref 0.61–1.24)
GFR, Estimated: >60 mL/min (ref >60)
Glucose, Bld: 131 mg/dL — ABNORMAL HIGH (ref 70–99)
Potassium: 4.5 mmol/L (ref 3.5–5.1)
Sodium: 139 mmol/L (ref 135–145)
Total Bilirubin: 0.7 mg/dL (ref 0.3–1.2)
Total Protein: 6.3 g/dL — ABNORMAL LOW (ref 6.5–8.1)

## 2021-08-28 LAB — CBC WITH DIFFERENTIAL (CANCER CENTER ONLY)
Abs Immature Granulocytes: 0.02 10*3/uL (ref 0.00–0.07)
Basophils Absolute: 0.1 10*3/uL (ref 0.0–0.1)
Basophils Relative: 1 %
Eosinophils Absolute: 0.1 10*3/uL (ref 0.0–0.5)
Eosinophils Relative: 2 %
HCT: 43.9 % (ref 39.0–52.0)
Hemoglobin: 15.5 g/dL (ref 13.0–17.0)
Immature Granulocytes: 0 %
Lymphocytes Relative: 19 %
Lymphs Abs: 1.4 10*3/uL (ref 0.7–4.0)
MCH: 32.7 pg (ref 26.0–34.0)
MCHC: 35.3 g/dL (ref 30.0–36.0)
MCV: 92.6 fL (ref 80.0–100.0)
Monocytes Absolute: 0.5 10*3/uL (ref 0.1–1.0)
Monocytes Relative: 7 %
Neutro Abs: 5.1 10*3/uL (ref 1.7–7.7)
Neutrophils Relative %: 71 %
Platelet Count: 217 10*3/uL (ref 150–400)
RBC: 4.74 MIL/uL (ref 4.22–5.81)
RDW: 11.7 % (ref 11.5–15.5)
WBC Count: 7.2 10*3/uL (ref 4.0–10.5)
nRBC: 0 % (ref 0.0–0.2)

## 2021-08-28 LAB — IRON AND TIBC
Iron: 107 ug/dL (ref 45–182)
Saturation Ratios: 27 % (ref 17.9–39.5)
TIBC: 399 ug/dL (ref 250–450)
UIBC: 292 ug/dL

## 2021-08-28 LAB — FERRITIN: Ferritin: 60 ng/mL (ref 24–336)

## 2021-08-28 NOTE — Telephone Encounter (Signed)
Appts made and printed for  pt per 08/28/21  Drew [name]

## 2021-08-28 NOTE — Progress Notes (Signed)
Hematology and Oncology Follow Up Visit  Drew [name] [id number] 1956-04-13 65 y.o. 08/28/2021   Principle Diagnosis:  Hemochromatosis, heterozygous for the H63D mutation  Current Therapy:   Phlebotomy to maintain ferritin < 100 and iron saturation < 30%   Interim History:  Drew [name] is here today with his wife for follow-up.  He was last seen in May.  At that time, his ferritin was 54 and an iron saturation of 21%.  Is still working.  He works at [employer].  He is a Nature conservation officer.  He has had no problems over the summertime.  He looks like he may have torn his right bicep.  Has a large lump in the distal aspect of the right upper arm.  Amazingly he can move things around.  However, I think he is going need to have a CT scan to see if there is a tear in the bicep.  He has had no problems with nausea or vomiting.  There is been no issues with COVID.  He has had no change in bowel or bladder habits.  There has been no problems with fever.  He has had no rashes.  There is been no leg swelling.  Overall, I would say his performance status is probably ECOG 1.     Medications:  Allergies as of 08/28/2021   No Known Allergies      Medication List        Accurate as of August 28, 2021  3:26 PM. If you have any questions, ask your nurse or doctor.          STOP taking these medications    Flaxseed Oil 1200 MG Caps Stopped by: Volanda Napoleon, MD   meclizine 25 MG tablet Commonly known as: ANTIVERT Stopped by: Volanda Napoleon, MD       TAKE these medications    aspirin EC 325 MG tablet Take 1 tablet (325 mg total) by mouth daily.   Fish Oil 1200 MG Caps Take 1,200 mg by mouth 2 (two) times daily.   lisinopril-hydrochlorothiazide 20-12.5 MG tablet Commonly known as: ZESTORETIC TAKE 2 TABLETS BY MOUTH EVERY DAY   pravastatin 40 MG tablet Commonly known as: PRAVACHOL TAKE 1 TABLET BY MOUTH EVERY DAY        Allergies: No Known Allergies  Past  Medical History, Surgical history, Social history, and Family History were reviewed and updated.  Review of Systems: Review of Systems  Constitutional: Negative.   HENT: Negative.    Eyes: Negative.   Respiratory: Negative.    Cardiovascular: Negative.   Gastrointestinal: Negative.   Genitourinary: Negative.   Musculoskeletal:  Positive for myalgias.  Skin: Negative.   Neurological: Negative.   Endo/Heme/Allergies: Negative.   Psychiatric/Behavioral: Negative.      Physical Exam:  weight is 151 lb 9.6 oz (68.8 kg). His oral temperature is 98.9 F (37.2 C). His blood pressure is 107/75 and his pulse is 80. His respiration is 20 and oxygen saturation is 97%.   Wt Readings from Last 3 Encounters:  08/28/21 151 lb 9.6 oz (68.8 kg)  04/26/21 151 lb 1.9 oz (68.5 kg)  01/24/21 154 lb (69.9 kg)    Physical Exam Vitals reviewed.  HENT:     Head: Normocephalic and atraumatic.  Eyes:     Pupils: Pupils are equal, round, and reactive to light.  Cardiovascular:     Rate and Rhythm: Normal rate and regular rhythm.     Heart sounds: Normal heart sounds.  Pulmonary:     Effort: Pulmonary effort is normal.     Breath sounds: Normal breath sounds.  Abdominal:     General: Bowel sounds are normal.     Palpations: Abdomen is soft.  Musculoskeletal:        General: No tenderness or deformity. Normal range of motion.     Cervical back: Normal range of motion.  Lymphadenopathy:     Cervical: No cervical adenopathy.  Skin:    General: Skin is warm and dry.     Findings: No erythema or rash.  Neurological:     Mental Status: He is alert and oriented to person, place, and time.  Psychiatric:        Behavior: Behavior normal.        Thought Content: Thought content normal.        Judgment: Judgment normal.     Lab Results  Component Value Date   WBC 7.2 08/28/2021   HGB 15.5 08/28/2021   HCT 43.9 08/28/2021   MCV 92.6 08/28/2021   PLT 217 08/28/2021   Lab Results  Component  Value Date   FERRITIN 65 06/26/2021   IRON 118 06/26/2021   TIBC 395 06/26/2021   UIBC 277 06/26/2021   IRONPCTSAT 30 06/26/2021   Lab Results  Component Value Date   RETICCTPCT 1.6 11/23/2020   RBC 4.74 08/28/2021   No results found for: KPAFRELGTCHN, LAMBDASER, KAPLAMBRATIO No results found for: IGGSERUM, IGA, IGMSERUM No results found for: Odetta Pink, SPEI   Chemistry      Component Value Date/Time   NA 139 08/28/2021 1419   K 4.5 08/28/2021 1419   CL 101 08/28/2021 1419   CO2 31 08/28/2021 1419   BUN 7 (L) 08/28/2021 1419   CREATININE 0.93 08/28/2021 1419   CREATININE 0.91 10/06/2020 1536      Component Value Date/Time   CALCIUM 9.4 08/28/2021 1419   ALKPHOS 53 08/28/2021 1419   AST 18 08/28/2021 1419   ALT 11 08/28/2021 1419   BILITOT 0.7 08/28/2021 1419       Impression and Plan: Drew [name] is a very pleasant 65 yo caucasian gentleman with hemochromatosis, heterzygous for the H63D mutation.   We will see what his iron studies show.  I like to see him back in about 4 months.  I think the CT scan will be very interesting to see how that looks.  Again his throat looks like he may have a torn bicep in the right arm.  If so, I am sure will have to get him over to orthopedic surgery to fix this.  Volanda Napoleon, MD 9/19/20223:26 PM

## 2021-09-08 ENCOUNTER — Ambulatory Visit (HOSPITAL_BASED_OUTPATIENT_CLINIC_OR_DEPARTMENT_OTHER)
Admission: RE | Admit: 2021-09-08 | Discharge: 2021-09-08 | Disposition: A | Discharge status: Home / Self Care | Payer: Medicare Other | Source: Ambulatory Visit | Attending: Hematology & Oncology | Admitting: Hematology & Oncology

## 2021-09-08 ENCOUNTER — Other Ambulatory Visit: Payer: Self-pay

## 2021-09-08 DIAGNOSIS — R2231 Localized swelling, mass and lump, right upper limb: Secondary | ICD-10-CM | POA: Diagnosis not present

## 2021-09-08 MED ORDER — IOHEXOL 350 MG/ML SOLN
100.0000 mL | Freq: Once | INTRAVENOUS | Status: AC | PRN
  Administered 2021-09-08: 85 mL via INTRAVENOUS

## 2021-09-11 ENCOUNTER — Encounter: Payer: Self-pay | Admitting: *Deleted

## 2021-09-11 ENCOUNTER — Telehealth: Payer: Self-pay | Admitting: *Deleted

## 2021-09-11 NOTE — Telephone Encounter (Signed)
Copied message from Dr Marin Olp in [place].  I left him a message on his cell phone.  It looks like he has a torn bicep tendon.  I told him that he needs to see orthopedic surgery.  Please call Dr. Rhona Raider for me so I can get Mr. Dolley over there to be evaluated.  Thanks.

## 2021-09-11 NOTE — Progress Notes (Signed)
Called patient to give this message.  LMAM to call us back

## 2021-09-11 NOTE — Telephone Encounter (Addendum)
-----   Message from Volanda Napoleon, MD sent at 09/11/2021  2:12 PM EDT ----- Following message copied from Dr Shirl Harris message.  "I spoke with the orthopedic surgeon -- Nothing needs to be done since the tendon is torn by the shoulder and not the elbow.  If there is a lot of pian, then surgery can be done"  Called patient and left message on personal cell phone to call us back with the above information

## 2021-09-11 NOTE — Progress Notes (Signed)
Dr Rhona Raider consulted for Dr Marin Olp in reference to patient recent scan per Dr Marin Olp request

## 2021-10-02 ENCOUNTER — Other Ambulatory Visit: Payer: Self-pay | Admitting: Family Medicine

## 2021-10-02 DIAGNOSIS — I10 Essential (primary) hypertension: Secondary | ICD-10-CM

## 2021-10-02 DIAGNOSIS — E785 Hyperlipidemia, unspecified: Secondary | ICD-10-CM

## 2021-12-22 ENCOUNTER — Encounter: Payer: Self-pay | Admitting: Hematology & Oncology

## 2021-12-22 ENCOUNTER — Inpatient Hospital Stay (HOSPITAL_BASED_OUTPATIENT_CLINIC_OR_DEPARTMENT_OTHER): Payer: Medicare Other | Admitting: Hematology & Oncology

## 2021-12-22 ENCOUNTER — Inpatient Hospital Stay: Payer: Medicare Other | Attending: Hematology & Oncology

## 2021-12-22 ENCOUNTER — Other Ambulatory Visit: Payer: Self-pay

## 2021-12-22 DIAGNOSIS — R2231 Localized swelling, mass and lump, right upper limb: Secondary | ICD-10-CM

## 2021-12-22 LAB — CMP (CANCER CENTER ONLY)
ALT: 11 U/L (ref 0–44)
AST: 16 U/L (ref 15–41)
Albumin: 3.9 g/dL (ref 3.5–5.0)
Alkaline Phosphatase: 61 U/L (ref 38–126)
Anion gap: 6 (ref 5–15)
BUN: 13 mg/dL (ref 8–23)
CO2: 34 mmol/L — ABNORMAL HIGH (ref 22–32)
Calcium: 9.8 mg/dL (ref 8.9–10.3)
Chloride: 102 mmol/L (ref 98–111)
Creatinine: 0.97 mg/dL (ref 0.61–1.24)
GFR, Estimated: >60 mL/min (ref >60)
Glucose, Bld: 109 mg/dL — ABNORMAL HIGH (ref 70–99)
Potassium: 4.3 mmol/L (ref 3.5–5.1)
Sodium: 142 mmol/L (ref 135–145)
Total Bilirubin: 0.7 mg/dL (ref 0.3–1.2)
Total Protein: 6 g/dL — ABNORMAL LOW (ref 6.5–8.1)

## 2021-12-22 LAB — CBC WITH DIFFERENTIAL (CANCER CENTER ONLY)
Abs Immature Granulocytes: 0.03 10*3/uL (ref 0.00–0.07)
Basophils Absolute: 0.1 10*3/uL (ref 0.0–0.1)
Basophils Relative: 1 %
Eosinophils Absolute: 0.6 10*3/uL — ABNORMAL HIGH (ref 0.0–0.5)
Eosinophils Relative: 8 %
HCT: 46.3 % (ref 39.0–52.0)
Hemoglobin: 15.9 g/dL (ref 13.0–17.0)
Immature Granulocytes: 0 %
Lymphocytes Relative: 26 %
Lymphs Abs: 1.9 10*3/uL (ref 0.7–4.0)
MCH: 32.4 pg (ref 26.0–34.0)
MCHC: 34.3 g/dL (ref 30.0–36.0)
MCV: 94.5 fL (ref 80.0–100.0)
Monocytes Absolute: 0.5 10*3/uL (ref 0.1–1.0)
Monocytes Relative: 7 %
Neutro Abs: 4.1 10*3/uL (ref 1.7–7.7)
Neutrophils Relative %: 58 %
Platelet Count: 223 10*3/uL (ref 150–400)
RBC: 4.9 MIL/uL (ref 4.22–5.81)
RDW: 11.5 % (ref 11.5–15.5)
WBC Count: 7.2 10*3/uL (ref 4.0–10.5)
nRBC: 0 % (ref 0.0–0.2)

## 2021-12-22 NOTE — Progress Notes (Signed)
Hematology and Oncology Follow Up Visit  Drew [name] [id number] 04/24/1956 66 y.o. 12/22/2021   Principle Diagnosis:  Hemochromatosis, heterozygous for the H63D mutation  Current Therapy:   Phlebotomy to maintain ferritin < 100 and iron saturation < 30%   Interim History:  Drew [name] is here today with his wife for follow-up.  He is doing quite well.  He still working at [employer].  He is quite busy over there.  He did have the problem with the right bicep.  I think he tore this.  He was in contact with Orthopedic Surgery.  They did not think that he needed any repair for this.  He is having good range of motion of his right arm.  When we last saw him, his iron studies showed a ferritin of 60 with an iron saturation of 27%.  I told him that he could really donate blood to the [employer].  His blood is fine.  No one will get hemochromatosis from his blood.  His blood type is B-.  This is an need.  Again I think this would be very helpful for the community and for Drew [name].  He has had no fever.  He has had no problems with COVID.  He has had no problems with nausea or vomiting.  There is no change in bowel or bladder habits.  There is been no rashes.  Overall, his performance status is ECOG 0.   Medications:  Allergies as of 12/22/2021   No Known Allergies      Medication List        Accurate as of December 22, 2021  4:13 PM. If you have any questions, ask your nurse or doctor.          aspirin EC 325 MG tablet Take 1 tablet (325 mg total) by mouth daily.   Fish Oil 1200 MG Caps Take 1,200 mg by mouth 2 (two) times daily.   lisinopril-hydrochlorothiazide 20-12.5 MG tablet Commonly known as: ZESTORETIC Take 2 tablets by mouth daily. Pt needs office visit for more follow ups   pravastatin 40 MG tablet Commonly known as: PRAVACHOL Take 1 tablet (40 mg total) by mouth daily. Pt needs office visit for more follow ups        Allergies: No Known  Allergies  Past Medical History, Surgical history, Social history, and Family History were reviewed and updated.  Review of Systems: Review of Systems  Constitutional: Negative.   HENT: Negative.    Eyes: Negative.   Respiratory: Negative.    Cardiovascular: Negative.   Gastrointestinal: Negative.   Genitourinary: Negative.   Musculoskeletal:  Positive for myalgias.  Skin: Negative.   Neurological: Negative.   Endo/Heme/Allergies: Negative.   Psychiatric/Behavioral: Negative.      Physical Exam:  height is 5\' 7"  (1.702 m) and weight is 152 lb 1.9 oz (69 kg). His oral temperature is 97.9 F (36.6 C). His blood pressure is 118/73 and his pulse is 66. His respiration is 18 and oxygen saturation is 100%.   Wt Readings from Last 3 Encounters:  12/22/21 152 lb 1.9 oz (69 kg)  08/28/21 151 lb 9.6 oz (68.8 kg)  04/26/21 151 lb 1.9 oz (68.5 kg)    Physical Exam Vitals reviewed.  HENT:     Head: Normocephalic and atraumatic.  Eyes:     Pupils: Pupils are equal, round, and reactive to light.  Cardiovascular:     Rate and Rhythm: Normal rate and regular rhythm.  Heart sounds: Normal heart sounds.  Pulmonary:     Effort: Pulmonary effort is normal.     Breath sounds: Normal breath sounds.  Abdominal:     General: Bowel sounds are normal.     Palpations: Abdomen is soft.  Musculoskeletal:        General: No tenderness or deformity. Normal range of motion.     Cervical back: Normal range of motion.  Lymphadenopathy:     Cervical: No cervical adenopathy.  Skin:    General: Skin is warm and dry.     Findings: No erythema or rash.  Neurological:     Mental Status: He is alert and oriented to person, place, and time.  Psychiatric:        Behavior: Behavior normal.        Thought Content: Thought content normal.        Judgment: Judgment normal.     Lab Results  Component Value Date   WBC 7.2 12/22/2021   HGB 15.9 12/22/2021   HCT 46.3 12/22/2021   MCV 94.5  12/22/2021   PLT 223 12/22/2021   Lab Results  Component Value Date   FERRITIN 60 08/28/2021   IRON 107 08/28/2021   TIBC 399 08/28/2021   UIBC 292 08/28/2021   IRONPCTSAT 27 08/28/2021   Lab Results  Component Value Date   RETICCTPCT 1.6 11/23/2020   RBC 4.90 12/22/2021   No results found for: KPAFRELGTCHN, LAMBDASER, KAPLAMBRATIO No results found for: IGGSERUM, IGA, IGMSERUM No results found for: Odetta Pink, SPEI   Chemistry      Component Value Date/Time   NA 142 12/22/2021 1454   K 4.3 12/22/2021 1454   CL 102 12/22/2021 1454   CO2 34 (H) 12/22/2021 1454   BUN 13 12/22/2021 1454   CREATININE 0.97 12/22/2021 1454   CREATININE 0.91 10/06/2020 1536      Component Value Date/Time   CALCIUM 9.8 12/22/2021 1454   ALKPHOS 61 12/22/2021 1454   AST 16 12/22/2021 1454   ALT 11 12/22/2021 1454   BILITOT 0.7 12/22/2021 1454       Impression and Plan: Drew [name] is a very pleasant 66 yo caucasian gentleman with hemochromatosis, heterzygous for the H63D mutation.   I would think that his iron studies should be okay.  Again, donating to the [employer] would really be helpful for him in the community.  I think we can probably get him back in 6 months.  I think this would be very reasonable for follow-up.  Again, I told him that donating blood 2-3 times a year would be a bonus.   Volanda Napoleon, MD 1/13/20234:13 PM

## 2021-12-25 LAB — IRON AND IRON BINDING CAPACITY (CC-WL,HP ONLY)
Iron: 103 ug/dL (ref 45–182)
Saturation Ratios: 26 % (ref 17.9–39.5)
TIBC: 400 ug/dL (ref 250–450)
UIBC: 297 ug/dL (ref 117–376)

## 2021-12-25 LAB — FERRITIN: Ferritin: 72 ng/mL (ref 24–336)

## 2021-12-31 ENCOUNTER — Other Ambulatory Visit: Payer: Self-pay | Admitting: Family Medicine

## 2021-12-31 DIAGNOSIS — E785 Hyperlipidemia, unspecified: Secondary | ICD-10-CM

## 2021-12-31 DIAGNOSIS — I10 Essential (primary) hypertension: Secondary | ICD-10-CM

## 2022-01-03 ENCOUNTER — Other Ambulatory Visit: Payer: Self-pay | Admitting: Family Medicine

## 2022-01-03 DIAGNOSIS — I10 Essential (primary) hypertension: Secondary | ICD-10-CM

## 2022-01-03 DIAGNOSIS — E785 Hyperlipidemia, unspecified: Secondary | ICD-10-CM

## 2022-01-12 ENCOUNTER — Encounter: Payer: Self-pay | Admitting: Family Medicine

## 2022-01-12 ENCOUNTER — Ambulatory Visit (INDEPENDENT_AMBULATORY_CARE_PROVIDER_SITE_OTHER): Payer: Medicare Other | Admitting: Family Medicine

## 2022-01-12 DIAGNOSIS — D582 Other hemoglobinopathies: Secondary | ICD-10-CM

## 2022-01-12 DIAGNOSIS — E785 Hyperlipidemia, unspecified: Secondary | ICD-10-CM

## 2022-01-12 DIAGNOSIS — I10 Essential (primary) hypertension: Secondary | ICD-10-CM

## 2022-01-12 LAB — LIPID PANEL
Cholesterol: 175 mg/dL (ref 0–200)
HDL: 52.9 mg/dL (ref >39.00)
LDL Cholesterol: 110 mg/dL — ABNORMAL HIGH (ref 0–99)
NonHDL: 122.06
Total CHOL/HDL Ratio: 3
Triglycerides: 62 mg/dL (ref 0.0–149.0)
VLDL: 12.4 mg/dL (ref 0.0–40.0)

## 2022-01-12 MED ORDER — LISINOPRIL-HYDROCHLOROTHIAZIDE 20-12.5 MG PO TABS: 2.0000 | ORAL_TABLET | Freq: Every day | ORAL | 1 refills | Status: DC

## 2022-01-12 MED ORDER — PRAVASTATIN SODIUM 40 MG PO TABS: 40.0000 mg | ORAL_TABLET | Freq: Every day | ORAL | 1 refills | Status: DC

## 2022-01-12 NOTE — Assessment & Plan Note (Signed)
Tolerating statin, encouraged heart healthy diet, avoid trans fats, minimize simple carbs and saturated fats. Increase exercise as tolerated 

## 2022-01-12 NOTE — Assessment & Plan Note (Signed)
Per hematology Lab Results  Component Value Date   WBC 7.2 12/22/2021   HGB 15.9 12/22/2021   HCT 46.3 12/22/2021   MCV 94.5 12/22/2021   PLT 223 12/22/2021

## 2022-01-12 NOTE — Patient Instructions (Signed)

## 2022-01-12 NOTE — Progress Notes (Signed)
Subjective:   By signing my name below, I, Zite Okoli, attest that this documentation has been prepared under the direction and in the presence ofYvonne R Carollee Herter, DO. 01/12/2022    Patient ID: Drew [name], male    DOB: 1956/03/10, 66 y.o.   MRN: [id number]  Chief Complaint  Patient presents with   Hypertension   Hyperlipidemia   Follow-up    HPI Patient is in today for an office visit f/u bp and cholesterol No complaints   His blood pressure is stable at today's visit. BP Readings from Last 3 Encounters:  01/12/22 124/82  12/22/21 118/73  08/28/21 107/75    He is requesting for a refill on 20-12.5 mg lisinopril-HCTZ and 40 mg pravastatin.  He is not interested in the Covid-19, shingles or pneumonia vaccines.   Past Medical History:  Diagnosis Date   CVA (cerebral vascular accident) ([place])    Hemochromatosis, hereditary ([place]) 08/28/2021   High cholesterol    Hypertension    Memory loss    Tubular adenoma of colon     Past Surgical History:  Procedure Laterality Date   COLONOSCOPY     HERNIA REPAIR Left 2004   POLYPECTOMY      Family History  Problem Relation Age of Onset   Heart disease Mother        Freda Jackson' disease Mother    Heart disease Father    Hyperlipidemia Brother    Hypertension Brother    Heart disease Paternal Uncle    Heart disease Paternal Uncle    Heart disease Paternal Uncle    Heart disease Paternal Uncle    Heart disease Paternal Uncle    Heart disease Paternal Uncle    Hypertension Brother    Breast cancer Sister    Colon cancer Neg Hx     Social History   Socioeconomic History   Marital status: Married    Spouse name: Not on file   Number of children: Not on file   Years of education: Not on file   Highest education level: Not on file  Occupational History   Not on file  Tobacco Use   Smoking status: Former    Packs/day: 1.00    Years: 15.00    Pack years: 15.00    Types: Cigarettes    Quit date: 01/19/1988     Years since quitting: 34.0   Smokeless tobacco: Current    Types: Chew  Vaping Use   Vaping Use: Never used  Substance and Sexual Activity   Alcohol use: No   Drug use: No   Sexual activity: Yes  Other Topics Concern   Not on file  Social History Narrative   Exercise-walking   Social Determinants of Health   Financial Resource Strain: Not on file  Food Insecurity: Not on file  Transportation Needs: Not on file  Physical Activity: Not on file  Stress: Not on file  Social Connections: Not on file  Intimate Partner Violence: Not on file    Outpatient Medications Prior to Visit  Medication Sig Dispense Refill   aspirin EC 325 MG tablet Take 1 tablet (325 mg total) by mouth daily. 30 tablet 0   Omega-3 Fatty Acids (FISH OIL) 1200 MG CAPS Take 1,200 mg by mouth 2 (two) times daily.      lisinopril-hydrochlorothiazide (ZESTORETIC) 20-12.5 MG tablet Take 2 tablets by mouth daily. Pt needs office visit for more follow ups 180 tablet 0   pravastatin (PRAVACHOL) 40  MG tablet Take 1 tablet (40 mg total) by mouth daily. Pt needs office visit for more follow ups 90 tablet 0   No facility-administered medications prior to visit.    No Known Allergies  Review of Systems  Constitutional:  Negative for chills, fever and malaise/fatigue.  HENT:  Negative for congestion and hearing loss.   Eyes:  Negative for discharge.  Respiratory:  Negative for cough, sputum production and shortness of breath.   Cardiovascular:  Negative for chest pain, palpitations and leg swelling.  Gastrointestinal:  Negative for abdominal pain, blood in stool, constipation, diarrhea, heartburn, nausea and vomiting.  Genitourinary:  Negative for dysuria, frequency, hematuria and urgency.  Musculoskeletal:  Negative for back pain, falls and myalgias.  Skin:  Negative for rash.  Neurological:  Negative for dizziness, sensory change, loss of consciousness, weakness and headaches.  Endo/Heme/Allergies:  Negative for  environmental allergies. Does not bruise/bleed easily.  Psychiatric/Behavioral:  Negative for depression and suicidal ideas. The patient is not nervous/anxious and does not have insomnia.       Objective:    Physical Exam Vitals and nursing note reviewed.  Constitutional:      Appearance: He is well-developed.  HENT:     Head: Normocephalic and atraumatic.  Eyes:     Pupils: Pupils are equal, round, and reactive to light.  Neck:     Thyroid: No thyromegaly.  Cardiovascular:     Rate and Rhythm: Normal rate and regular rhythm.     Heart sounds: No murmur heard. Pulmonary:     Effort: Pulmonary effort is normal. No respiratory distress.     Breath sounds: Normal breath sounds. No wheezing or rales.  Chest:     Chest wall: No tenderness.  Musculoskeletal:     Cervical back: Normal range of motion and neck supple.     Right hip: Tenderness present. Normal range of motion. Normal strength.     Left hip: Tenderness present. Normal range of motion. Normal strength.     Right foot: Bony tenderness present. No swelling.     Left foot: Bony tenderness present. No swelling.  Skin:    General: Skin is warm and dry.  Neurological:     Mental Status: He is alert and oriented to person, place, and time.  Psychiatric:        Behavior: Behavior normal.        Thought Content: Thought content normal.        Judgment: Judgment normal.    BP 124/82 (BP Location: Left Arm, Patient Position: Sitting, Cuff Size: Normal)    Pulse 63    Temp 98.6 F (37 C) (Oral)    Resp 18    Ht 5\' 7"  (1.702 m)    Wt 152 lb 3.2 oz (69 kg)    SpO2 99%    BMI 23.84 kg/m  Wt Readings from Last 3 Encounters:  01/12/22 152 lb 3.2 oz (69 kg)  12/22/21 152 lb 1.9 oz (69 kg)  08/28/21 151 lb 9.6 oz (68.8 kg)    Diabetic Foot Exam - Simple   No data filed    Lab Results  Component Value Date   WBC 7.2 12/22/2021   HGB 15.9 12/22/2021   HCT 46.3 12/22/2021   PLT 223 12/22/2021   GLUCOSE 109 (H) 12/22/2021    CHOL 155 10/06/2020   TRIG 100 10/06/2020   HDL 48 10/06/2020   LDLCALC 87 10/06/2020   ALT 11 12/22/2021   AST 16 12/22/2021  NA 142 12/22/2021   K 4.3 12/22/2021   CL 102 12/22/2021   CREATININE 0.97 12/22/2021   BUN 13 12/22/2021   CO2 34 (H) 12/22/2021   TSH 0.967 10/09/2020   PSA 2.18 04/21/2020   HGBA1C 5.4 10/06/2020    Lab Results  Component Value Date   TSH 0.967 10/09/2020   Lab Results  Component Value Date   WBC 7.2 12/22/2021   HGB 15.9 12/22/2021   HCT 46.3 12/22/2021   MCV 94.5 12/22/2021   PLT 223 12/22/2021   Lab Results  Component Value Date   NA 142 12/22/2021   K 4.3 12/22/2021   CO2 34 (H) 12/22/2021   GLUCOSE 109 (H) 12/22/2021   BUN 13 12/22/2021   CREATININE 0.97 12/22/2021   BILITOT 0.7 12/22/2021   ALKPHOS 61 12/22/2021   AST 16 12/22/2021   ALT 11 12/22/2021   PROT 6.0 (L) 12/22/2021   ALBUMIN 3.9 12/22/2021   CALCIUM 9.8 12/22/2021   ANIONGAP 6 12/22/2021   GFR 91.23 10/20/2020   Lab Results  Component Value Date   CHOL 155 10/06/2020   Lab Results  Component Value Date   HDL 48 10/06/2020   Lab Results  Component Value Date   LDLCALC 87 10/06/2020   Lab Results  Component Value Date   TRIG 100 10/06/2020   Lab Results  Component Value Date   CHOLHDL 3.2 10/06/2020   Lab Results  Component Value Date   HGBA1C 5.4 10/06/2020       Assessment & Plan:   Problem List Items Addressed This Visit       Unprioritized   Elevated hemoglobin ([place])    Per hematology Lab Results  Component Value Date   WBC 7.2 12/22/2021   HGB 15.9 12/22/2021   HCT 46.3 12/22/2021   MCV 94.5 12/22/2021   PLT 223 12/22/2021         HTN (hypertension)    Well controlled, no changes to meds. Encouraged heart healthy diet such as the DASH diet and exercise as tolerated.       Relevant Medications   lisinopril-hydrochlorothiazide (ZESTORETIC) 20-12.5 MG tablet   pravastatin (PRAVACHOL) 40 MG tablet   Hyperlipidemia LDL goal  <100    Tolerating statin, encouraged heart healthy diet, avoid trans fats, minimize simple carbs and saturated fats. Increase exercise as tolerated      Relevant Medications   lisinopril-hydrochlorothiazide (ZESTORETIC) 20-12.5 MG tablet   pravastatin (PRAVACHOL) 40 MG tablet   Other Relevant Orders   Lipid panel   Other Visit Diagnoses     Essential hypertension       Relevant Medications   lisinopril-hydrochlorothiazide (ZESTORETIC) 20-12.5 MG tablet   pravastatin (PRAVACHOL) 40 MG tablet   Other Relevant Orders   Lipid panel        Meds ordered this encounter  Medications   lisinopril-hydrochlorothiazide (ZESTORETIC) 20-12.5 MG tablet    Sig: Take 2 tablets by mouth daily.    Dispense:  180 tablet    Refill:  1   pravastatin (PRAVACHOL) 40 MG tablet    Sig: Take 1 tablet (40 mg total) by mouth daily.    Dispense:  90 tablet    Refill:  1    I,Zite Okoli,acting as a Education administrator for [employer], DO.,have documented all relevant documentation on the behalf of [name], DO,as directed by  [name], DO while in the presence of Lethea Killings  R Lowne Chase, DO. , personally preformed the services described in this documentation.  All medical record entries made by the scribe were at my direction and in my presence.  I have reviewed the chart and discharge instructions (if applicable) and agree that the record reflects my personal performance and is accurate and complete. 01/12/2022

## 2022-01-12 NOTE — Assessment & Plan Note (Signed)
Well controlled, no changes to meds. Encouraged heart healthy diet such as the DASH diet and exercise as tolerated.  °

## 2022-01-15 ENCOUNTER — Telehealth: Payer: Self-pay | Admitting: Family Medicine

## 2022-01-15 DIAGNOSIS — E785 Hyperlipidemia, unspecified: Secondary | ICD-10-CM

## 2022-01-15 DIAGNOSIS — I10 Essential (primary) hypertension: Secondary | ICD-10-CM

## 2022-01-15 MED ORDER — PRAVASTATIN SODIUM 40 MG PO TABS: 40.0000 mg | ORAL_TABLET | Freq: Every day | ORAL | 1 refills | Status: DC

## 2022-01-15 MED ORDER — LISINOPRIL-HYDROCHLOROTHIAZIDE 20-12.5 MG PO TABS: 2.0000 | ORAL_TABLET | Freq: Every day | ORAL | 1 refills | Status: DC

## 2022-01-15 NOTE — Telephone Encounter (Signed)
Patient spouse contacted ov regarding pharmacy change.  Meds should be switched to pharmacy below.  [facility]  [place] [address] [phone] pravastatin (PRAVACHOL) 40 MG tablet [[id number]] lisinopril-hydrochlorothiazide (ZESTORETIC) 20-12.5 MG tablet [[id number]]

## 2022-01-15 NOTE — Telephone Encounter (Signed)
Meds sent to the correct pharmacy

## 2022-03-21 ENCOUNTER — Telehealth: Payer: Self-pay | Admitting: Family Medicine

## 2022-03-21 NOTE — Telephone Encounter (Signed)
Left message for patient to call back and schedule Medicare Annual Wellness Visit (AWV) either virtually or phone ? ? ?awvi 05/10/21 per [facility]  ?please schedule at anytime with health coach ? ?This should be a 45 minute visit.  ?

## 2022-03-26 ENCOUNTER — Telehealth: Payer: Self-pay

## 2022-03-26 ENCOUNTER — Ambulatory Visit: Payer: Medicare Other

## 2022-03-26 NOTE — Telephone Encounter (Signed)
This nurse attempted to call patient three times on number that was given in appointment for telephonic AWV. All calls went straight to voicemail. Message left that we will call back to reschedule. ?

## 2022-05-18 ENCOUNTER — Inpatient Hospital Stay (HOSPITAL_BASED_OUTPATIENT_CLINIC_OR_DEPARTMENT_OTHER): Payer: Medicare Other | Admitting: Hematology & Oncology

## 2022-05-18 ENCOUNTER — Inpatient Hospital Stay: Payer: Medicare Other | Attending: Hematology & Oncology

## 2022-05-18 ENCOUNTER — Other Ambulatory Visit: Payer: Self-pay | Admitting: Oncology

## 2022-05-18 ENCOUNTER — Other Ambulatory Visit: Payer: Self-pay

## 2022-05-18 ENCOUNTER — Encounter: Payer: Self-pay | Admitting: Hematology & Oncology

## 2022-05-18 LAB — CBC WITH DIFFERENTIAL (CANCER CENTER ONLY)
Abs Immature Granulocytes: 0.01 10*3/uL (ref 0.00–0.07)
Basophils Absolute: 0.1 10*3/uL (ref 0.0–0.1)
Basophils Relative: 1 %
Eosinophils Absolute: 0.4 10*3/uL (ref 0.0–0.5)
Eosinophils Relative: 7 %
HCT: 48.3 % (ref 39.0–52.0)
Hemoglobin: 16.5 g/dL (ref 13.0–17.0)
Immature Granulocytes: 0 %
Lymphocytes Relative: 31 %
Lymphs Abs: 1.8 10*3/uL (ref 0.7–4.0)
MCH: 32.9 pg (ref 26.0–34.0)
MCHC: 34.2 g/dL (ref 30.0–36.0)
MCV: 96.4 fL (ref 80.0–100.0)
Monocytes Absolute: 0.5 10*3/uL (ref 0.1–1.0)
Monocytes Relative: 9 %
Neutro Abs: 3.1 10*3/uL (ref 1.7–7.7)
Neutrophils Relative %: 52 %
Platelet Count: 209 10*3/uL (ref 150–400)
RBC: 5.01 MIL/uL (ref 4.22–5.81)
RDW: 11.9 % (ref 11.5–15.5)
WBC Count: 5.9 10*3/uL (ref 4.0–10.5)
nRBC: 0 % (ref 0.0–0.2)

## 2022-05-18 LAB — CMP (CANCER CENTER ONLY)
ALT: 11 U/L (ref 0–44)
AST: 17 U/L (ref 15–41)
Albumin: 4.1 g/dL (ref 3.5–5.0)
Alkaline Phosphatase: 62 U/L (ref 38–126)
Anion gap: 5 (ref 5–15)
BUN: 10 mg/dL (ref 8–23)
CO2: 32 mmol/L (ref 22–32)
Calcium: 9.7 mg/dL (ref 8.9–10.3)
Chloride: 101 mmol/L (ref 98–111)
Creatinine: 1 mg/dL (ref 0.61–1.24)
GFR, Estimated: >60 mL/min (ref >60)
Glucose, Bld: 89 mg/dL (ref 70–99)
Potassium: 4.6 mmol/L (ref 3.5–5.1)
Sodium: 138 mmol/L (ref 135–145)
Total Bilirubin: 0.8 mg/dL (ref 0.3–1.2)
Total Protein: 6.5 g/dL (ref 6.5–8.1)

## 2022-05-18 LAB — FERRITIN: Ferritin: 62 ng/mL (ref 24–336)

## 2022-05-18 NOTE — Progress Notes (Signed)
Hematology and Oncology Follow Up Visit  Drew [name] [id number] 05/22/1956 66 y.o. 05/18/2022   Principle Diagnosis:  Hemochromatosis, heterozygous for the H63D mutation  Current Therapy:   Phlebotomy to maintain ferritin < 100 and iron saturation < 30%   Interim History:  Drew [name] is here today with his wife for follow-up.  We saw him back in January.  At that time, his ferritin was 72 with an iron saturation of 26%.  I have told him that he could easily donate blood to the [employer].  His blood is perfect.  He has B- blood type.  His blood certainly can be used by the community.  Hopefully, he will consider doing this.  Otherwise, he really has had no issues.  He has had no problems with COVID.  He is eating well.  He has had no bleeding.  There is no rashes.  He has had no fever.  There is been no cough or shortness of breath.  He has had no leg swelling.  Currently, I would say his performance status is probably ECOG 0.     Medications:  Allergies as of 05/18/2022   No Known Allergies      Medication List        Accurate as of May 18, 2022  4:01 PM. If you have any questions, ask your nurse or doctor.          aspirin EC 325 MG tablet Take 1 tablet (325 mg total) by mouth daily.   Fish Oil 1200 MG Caps Take 1,200 mg by mouth 2 (two) times daily.   lisinopril-hydrochlorothiazide 20-12.5 MG tablet Commonly known as: ZESTORETIC Take 2 tablets by mouth daily.   pravastatin 40 MG tablet Commonly known as: PRAVACHOL Take 1 tablet (40 mg total) by mouth daily.        Allergies: No Known Allergies  Past Medical History, Surgical history, Social history, and Family History were reviewed and updated.  Review of Systems: Review of Systems  Constitutional: Negative.   HENT: Negative.    Eyes: Negative.   Respiratory: Negative.    Cardiovascular: Negative.   Gastrointestinal: Negative.   Genitourinary: Negative.   Musculoskeletal:  Positive for myalgias.   Skin: Negative.   Neurological: Negative.   Endo/Heme/Allergies: Negative.   Psychiatric/Behavioral: Negative.       Physical Exam:  weight is 155 lb (70.3 kg). His oral temperature is 98.2 F (36.8 C). His blood pressure is 139/79 and his pulse is 65. His respiration is 18 and oxygen saturation is 98%.   Wt Readings from Last 3 Encounters:  05/18/22 155 lb (70.3 kg)  01/12/22 152 lb 3.2 oz (69 kg)  12/22/21 152 lb 1.9 oz (69 kg)    Physical Exam Vitals reviewed.  HENT:     Head: Normocephalic and atraumatic.  Eyes:     Pupils: Pupils are equal, round, and reactive to light.  Cardiovascular:     Rate and Rhythm: Normal rate and regular rhythm.     Heart sounds: Normal heart sounds.  Pulmonary:     Effort: Pulmonary effort is normal.     Breath sounds: Normal breath sounds.  Abdominal:     General: Bowel sounds are normal.     Palpations: Abdomen is soft.  Musculoskeletal:        General: No tenderness or deformity. Normal range of motion.     Cervical back: Normal range of motion.  Lymphadenopathy:     Cervical: No cervical adenopathy.  Skin:    General: Skin is warm and dry.     Findings: No erythema or rash.  Neurological:     Mental Status: He is alert and oriented to person, place, and time.  Psychiatric:        Behavior: Behavior normal.        Thought Content: Thought content normal.        Judgment: Judgment normal.      Lab Results  Component Value Date   WBC 5.9 05/18/2022   HGB 16.5 05/18/2022   HCT 48.3 05/18/2022   MCV 96.4 05/18/2022   PLT 209 05/18/2022   Lab Results  Component Value Date   FERRITIN 72 12/22/2021   IRON 103 12/22/2021   TIBC 400 12/22/2021   UIBC 297 12/22/2021   IRONPCTSAT 26 12/22/2021   Lab Results  Component Value Date   RETICCTPCT 1.6 11/23/2020   RBC 5.01 05/18/2022   No results found for: "KPAFRELGTCHN", "LAMBDASER", "KAPLAMBRATIO" No results found for: "IGGSERUM", "IGA", "IGMSERUM" No results found for:  "TOTALPROTELP", "ALBUMINELP", "A1GS", "A2GS", "BETS", "BETA2SER", "GAMS", "MSPIKE", "SPEI"   Chemistry      Component Value Date/Time   NA 138 05/18/2022 1506   K 4.6 05/18/2022 1506   CL 101 05/18/2022 1506   CO2 32 05/18/2022 1506   BUN 10 05/18/2022 1506   CREATININE 1.00 05/18/2022 1506   CREATININE 0.91 10/06/2020 1536      Component Value Date/Time   CALCIUM 9.7 05/18/2022 1506   ALKPHOS 62 05/18/2022 1506   AST 17 05/18/2022 1506   ALT 11 05/18/2022 1506   BILITOT 0.8 05/18/2022 1506       Impression and Plan: Drew [name] is a very pleasant 66 yo caucasian gentleman with hemochromatosis, heterzygous for the H63D mutation.   I would think that his iron studies should be okay.  Again, donating to the [employer] would really be helpful for him in the community.  I think we can probably get him back in 5 months.  I think this would be very reasonable for follow-up.  Again, I told him that donating blood 2-3 times a year would be a bonus.   Drew Napoleon, MD 6/9/20234:01 PM

## 2022-05-21 ENCOUNTER — Telehealth: Payer: Self-pay | Admitting: *Deleted

## 2022-05-21 LAB — IRON AND IRON BINDING CAPACITY (CC-WL,HP ONLY)
Iron: 126 ug/dL (ref 45–182)
Saturation Ratios: 30 % (ref 17.9–39.5)
TIBC: 421 ug/dL (ref 250–450)
UIBC: 295 ug/dL (ref 117–376)

## 2022-05-21 NOTE — Telephone Encounter (Signed)
-----   Message from Volanda Napoleon, MD sent at 05/21/2022  5:40 PM EDT ----- Call him let him know that the iron is okay.  However, he must must donate blood to the [employer] every 3 months.  Thanks.  Laurey Arrow

## 2022-05-21 NOTE — Telephone Encounter (Signed)
Pt notified per order of Dr. Marin Olp "that the iron is okay.  However, he must donate blood to the [facility] every 3 months.  Thanks.  Drew [name]"  Pt appreciative of call and states that he will donate blood at the [facility] every 3 months per order of Dr. Marin Olp.

## 2022-06-21 ENCOUNTER — Telehealth: Payer: Self-pay | Admitting: Family Medicine

## 2022-06-21 NOTE — Telephone Encounter (Signed)
Left message for patient to call back and schedule Medicare Annual Wellness Visit (AWV).   Please offer to do virtually or by telephone.  Left office number and my jabber [phone].  AWVI eligible as of 05/10/21  Please schedule at anytime with Nurse Health Advisor.

## 2022-07-11 ENCOUNTER — Other Ambulatory Visit: Payer: Self-pay | Admitting: Family Medicine

## 2022-07-11 DIAGNOSIS — I10 Essential (primary) hypertension: Secondary | ICD-10-CM

## 2022-07-12 ENCOUNTER — Other Ambulatory Visit: Payer: Self-pay | Admitting: Family Medicine

## 2022-07-12 DIAGNOSIS — E785 Hyperlipidemia, unspecified: Secondary | ICD-10-CM

## 2022-10-03 ENCOUNTER — Encounter: Payer: Self-pay | Admitting: Internal Medicine

## 2022-10-17 ENCOUNTER — Encounter: Payer: Self-pay | Admitting: Internal Medicine

## 2022-10-25 ENCOUNTER — Other Ambulatory Visit: Payer: Self-pay

## 2022-10-25 ENCOUNTER — Encounter: Payer: Self-pay | Admitting: Hematology & Oncology

## 2022-10-25 ENCOUNTER — Inpatient Hospital Stay: Payer: Medicare Other | Attending: Hematology & Oncology

## 2022-10-25 ENCOUNTER — Inpatient Hospital Stay (HOSPITAL_BASED_OUTPATIENT_CLINIC_OR_DEPARTMENT_OTHER): Payer: Medicare Other | Admitting: Hematology & Oncology

## 2022-10-25 LAB — CBC WITH DIFFERENTIAL (CANCER CENTER ONLY)
Abs Immature Granulocytes: 0.02 10*3/uL (ref 0.00–0.07)
Basophils Absolute: 0.1 10*3/uL (ref 0.0–0.1)
Basophils Relative: 1 %
Eosinophils Absolute: 0.4 10*3/uL (ref 0.0–0.5)
Eosinophils Relative: 5 %
HCT: 43.1 % (ref 39.0–52.0)
Hemoglobin: 14.8 g/dL (ref 13.0–17.0)
Immature Granulocytes: 0 %
Lymphocytes Relative: 23 %
Lymphs Abs: 1.9 10*3/uL (ref 0.7–4.0)
MCH: 32.2 pg (ref 26.0–34.0)
MCHC: 34.3 g/dL (ref 30.0–36.0)
MCV: 93.7 fL (ref 80.0–100.0)
Monocytes Absolute: 0.8 10*3/uL (ref 0.1–1.0)
Monocytes Relative: 9 %
Neutro Abs: 5.3 10*3/uL (ref 1.7–7.7)
Neutrophils Relative %: 62 %
Platelet Count: 226 10*3/uL (ref 150–400)
RBC: 4.6 MIL/uL (ref 4.22–5.81)
RDW: 12.1 % (ref 11.5–15.5)
WBC Count: 8.5 10*3/uL (ref 4.0–10.5)
nRBC: 0 % (ref 0.0–0.2)

## 2022-10-25 LAB — FERRITIN: Ferritin: 27 ng/mL (ref 24–336)

## 2022-10-25 LAB — CMP (CANCER CENTER ONLY)
ALT: 12 U/L (ref 0–44)
AST: 19 U/L (ref 15–41)
Albumin: 3.9 g/dL (ref 3.5–5.0)
Alkaline Phosphatase: 71 U/L (ref 38–126)
Anion gap: 7 (ref 5–15)
BUN: 8 mg/dL (ref 8–23)
CO2: 33 mmol/L — ABNORMAL HIGH (ref 22–32)
Calcium: 9.7 mg/dL (ref 8.9–10.3)
Chloride: 99 mmol/L (ref 98–111)
Creatinine: 0.89 mg/dL (ref 0.61–1.24)
GFR, Estimated: >60 mL/min (ref >60)
Glucose, Bld: 96 mg/dL (ref 70–99)
Potassium: 4.3 mmol/L (ref 3.5–5.1)
Sodium: 139 mmol/L (ref 135–145)
Total Bilirubin: 0.7 mg/dL (ref 0.3–1.2)
Total Protein: 6.9 g/dL (ref 6.5–8.1)

## 2022-10-25 NOTE — Progress Notes (Signed)
Hematology and Oncology Follow Up Visit  Drew [name] [id number] January 15, 1956 66 y.o. 10/25/2022   Principle Diagnosis:  Hemochromatosis, heterozygous for the H63D mutation  Current Therapy:   Phlebotomy to maintain ferritin < 100 and iron saturation < 30%   Interim History:  Drew [name] is here today for follow-up.  We last saw Drew [name] back in June.  At that time, he has been doing well.  He still is working quite a bit.  He works for a [employer].  He had a good summer.  He had a good vacation in [place] recently.  His iron studies have been doing pretty well.  He donates to the [employer].  He will give another donation in December.  When we last saw Drew [name] back in June, his ferritin was 62 with an iron saturation of 30%.  He has had no change in bowel or bladder habits.  He has had no leg swelling.  He has had no joint aches or pains.  He has had no cough or shortness of breath.  There has been no problems with COVID.  Overall, I would say his performance status is ECOG 0.   Medications:  Allergies as of 10/25/2022   No Known Allergies      Medication List        Accurate as of October 25, 2022  3:05 PM. If you have any questions, ask your nurse or doctor.          aspirin EC 325 MG tablet Take 1 tablet (325 mg total) by mouth daily.   Fish Oil 1200 MG Caps Take 1,200 mg by mouth 2 (two) times daily.   lisinopril-hydrochlorothiazide 20-12.5 MG tablet Commonly known as: ZESTORETIC TAKE 2 TABLETS BY MOUTH DAILY   pravastatin 40 MG tablet Commonly known as: PRAVACHOL TAKE 1 TABLET BY MOUTH EVERY DAY        Allergies: No Known Allergies  Past Medical History, Surgical history, Social history, and Family History were reviewed and updated.  Review of Systems: Review of Systems  Constitutional: Negative.   HENT: Negative.    Eyes: Negative.   Respiratory: Negative.    Cardiovascular: Negative.   Gastrointestinal: Negative.   Genitourinary:  Negative.   Musculoskeletal:  Positive for myalgias.  Skin: Negative.   Neurological: Negative.   Endo/Heme/Allergies: Negative.   Psychiatric/Behavioral: Negative.       Physical Exam:  height is '5\' 7"'$  (1.702 m) and weight is 158 lb 12.8 oz (72 kg). His oral temperature is 98 F (36.7 C). His blood pressure is 127/86 and his pulse is 75. His respiration is 18 and oxygen saturation is 100%.   Wt Readings from Last 3 Encounters:  10/25/22 158 lb 12.8 oz (72 kg)  05/18/22 155 lb (70.3 kg)  01/12/22 152 lb 3.2 oz (69 kg)    Physical Exam Vitals reviewed.  HENT:     Head: Normocephalic and atraumatic.  Eyes:     Pupils: Pupils are equal, round, and reactive to light.  Cardiovascular:     Rate and Rhythm: Normal rate and regular rhythm.     Heart sounds: Normal heart sounds.  Pulmonary:     Effort: Pulmonary effort is normal.     Breath sounds: Normal breath sounds.  Abdominal:     General: Bowel sounds are normal.     Palpations: Abdomen is soft.  Musculoskeletal:        General: No tenderness or deformity. Normal range of motion.  Cervical back: Normal range of motion.  Lymphadenopathy:     Cervical: No cervical adenopathy.  Skin:    General: Skin is warm and dry.     Findings: No erythema or rash.  Neurological:     Mental Status: He is alert and oriented to person, place, and time.  Psychiatric:        Behavior: Behavior normal.        Thought Content: Thought content normal.        Judgment: Judgment normal.      Lab Results  Component Value Date   WBC 8.5 10/25/2022   HGB 14.8 10/25/2022   HCT 43.1 10/25/2022   MCV 93.7 10/25/2022   PLT 226 10/25/2022   Lab Results  Component Value Date   FERRITIN 62 05/18/2022   IRON 126 05/18/2022   TIBC 421 05/18/2022   UIBC 295 05/18/2022   IRONPCTSAT 30 05/18/2022   Lab Results  Component Value Date   RETICCTPCT 1.6 11/23/2020   RBC 4.60 10/25/2022   No results found for: "KPAFRELGTCHN", "LAMBDASER",  "KAPLAMBRATIO" No results found for: "IGGSERUM", "IGA", "IGMSERUM" No results found for: "TOTALPROTELP", "ALBUMINELP", "A1GS", "A2GS", "BETS", "BETA2SER", "GAMS", "MSPIKE", "SPEI"   Chemistry      Component Value Date/Time   NA 138 05/18/2022 1506   K 4.6 05/18/2022 1506   CL 101 05/18/2022 1506   CO2 32 05/18/2022 1506   BUN 10 05/18/2022 1506   CREATININE 1.00 05/18/2022 1506   CREATININE 0.91 10/06/2020 1536      Component Value Date/Time   CALCIUM 9.7 05/18/2022 1506   ALKPHOS 62 05/18/2022 1506   AST 17 05/18/2022 1506   ALT 11 05/18/2022 1506   BILITOT 0.8 05/18/2022 1506       Impression and Plan: Drew [name] is a very pleasant 66 yo caucasian gentleman with hemochromatosis, heterzygous for the H63D mutation.   We will see what his iron studies look like.  Again he is donating to the [employer] which is a fantastic thing to do.  He is really helping himself in the community.  We will plan to get Drew [name] back in 6 more months.  He will keep his donations on a regular basis to the [employer].   Volanda Napoleon, MD 11/16/20233:05 PM

## 2022-10-26 ENCOUNTER — Encounter: Payer: Self-pay | Admitting: *Deleted

## 2022-10-26 LAB — IRON AND IRON BINDING CAPACITY (CC-WL,HP ONLY)
Iron: 50 ug/dL (ref 45–182)
Saturation Ratios: 11 % — ABNORMAL LOW (ref 17.9–39.5)
TIBC: 441 ug/dL (ref 250–450)
UIBC: 391 ug/dL — ABNORMAL HIGH (ref 117–376)

## 2022-11-16 ENCOUNTER — Ambulatory Visit (AMBULATORY_SURGERY_CENTER): Payer: Medicare Other

## 2022-11-16 VITALS — Ht 66.0 in | Wt 155.0 lb

## 2022-11-16 DIAGNOSIS — Z860100 Personal history of colon polyps, unspecified: Secondary | ICD-10-CM

## 2022-11-16 MED ORDER — NA SULFATE-K SULFATE-MG SULF 17.5-3.13-1.6 GM/177ML PO SOLN: 1.0000 | Freq: Once | ORAL | 0 refills | Status: AC

## 2022-11-16 NOTE — Progress Notes (Signed)

## 2022-12-14 ENCOUNTER — Ambulatory Visit (AMBULATORY_SURGERY_CENTER): Payer: Medicare Other | Admitting: Internal Medicine

## 2022-12-14 ENCOUNTER — Encounter: Payer: Self-pay | Admitting: Internal Medicine

## 2022-12-14 VITALS — BP 114/62 | HR 56 | Temp 97.3°F | Resp 14 | Ht 67.0 in | Wt 155.0 lb

## 2022-12-14 DIAGNOSIS — D122 Benign neoplasm of ascending colon: Secondary | ICD-10-CM

## 2022-12-14 DIAGNOSIS — Z09 Encounter for follow-up examination after completed treatment for conditions other than malignant neoplasm: Secondary | ICD-10-CM

## 2022-12-14 DIAGNOSIS — Z86010 Personal history of colonic polyps: Secondary | ICD-10-CM | POA: Diagnosis not present

## 2022-12-14 MED ORDER — SODIUM CHLORIDE 0.9 % IV SOLN: 500.0000 mL | Freq: Once | INTRAVENOUS | Status: DC

## 2022-12-14 MED ORDER — FLEET ENEMA 7-19 GM/118ML RE ENEM
1.0000 | ENEMA | Freq: Once | RECTAL | Status: AC
  Administered 2022-12-14: 1 via RECTAL

## 2022-12-14 NOTE — Progress Notes (Signed)
Patient states last BM was dark and unable to see through. Administered Fleets Enema per standing order. Patient is clear/dark yellow afterwards.

## 2022-12-14 NOTE — Progress Notes (Signed)
GASTROENTEROLOGY PROCEDURE H&P NOTE   Primary Care Physician: [name], DO    Reason for Procedure:  History of adenomatous colon polyps  Plan:    Colonoscopy  Patient is appropriate for endoscopic procedure(s) in the ambulatory ([place]) setting.  The nature of the procedure, as well as the risks, benefits, and alternatives were carefully and thoroughly reviewed with the patient. Ample time for discussion and questions allowed. The patient understood, was satisfied, and agreed to proceed.     HPI: Drew [name] is a 67 y.o. male who presents for surveillance colonoscopy.  Medical history as below.  Tolerated the prep.  No recent chest pain or shortness of breath.  No abdominal pain today.  Past Medical History:  Diagnosis Date   CVA (cerebral vascular accident) ([place])    Hemochromatosis, hereditary ([place]) 08/28/2021   High cholesterol    Hypertension    Memory loss    Tubular adenoma of colon     Past Surgical History:  Procedure Laterality Date   COLONOSCOPY     HERNIA REPAIR Left 2004   POLYPECTOMY      Prior to Admission medications   Medication Sig Start Date End Date Taking? Authorizing Provider  aspirin EC 325 MG tablet Take 1 tablet (325 mg total) by mouth daily. 03/21/17  Yes Modena Jansky, MD  lisinopril-hydrochlorothiazide (ZESTORETIC) 20-12.5 MG tablet TAKE 2 TABLETS BY MOUTH DAILY 07/11/22  Yes [name], DO  Omega-3 Fatty Acids (FISH OIL) 1200 MG CAPS Take 1,200 mg by mouth 2 (two) times daily.    Yes [provider]  pravastatin (PRAVACHOL) 40 MG tablet TAKE 1 TABLET BY MOUTH EVERY DAY 07/12/22  Yes [name], DO    Current Outpatient Medications  Medication Sig Dispense Refill   aspirin EC 325 MG tablet Take 1 tablet (325 mg total) by mouth daily. 30 tablet 0   lisinopril-hydrochlorothiazide (ZESTORETIC) 20-12.5 MG tablet TAKE 2 TABLETS BY MOUTH DAILY 180 tablet 1   Omega-3 Fatty Acids (FISH OIL) 1200 MG CAPS  Take 1,200 mg by mouth 2 (two) times daily.      pravastatin (PRAVACHOL) 40 MG tablet TAKE 1 TABLET BY MOUTH EVERY DAY 90 tablet 1   Current Facility-Administered Medications  Medication Dose Route Frequency Provider Last Rate Last Admin   0.9 %  sodium chloride infusion  500 mL Intravenous Once Jema Deegan, Lajuan Lines, MD        Allergies as of 12/14/2022   (No Known Allergies)    Family History  Problem Relation Age of Onset   Heart disease Mother        Freda Jackson' disease Mother    Heart disease Father    Breast cancer Sister    Hyperlipidemia Brother    Hypertension Brother    Hypertension Brother    Heart disease Paternal Uncle    Heart disease Paternal Uncle    Heart disease Paternal Uncle    Heart disease Paternal Uncle    Heart disease Paternal Uncle    Heart disease Paternal Uncle    Colon cancer Neg Hx    Colon polyps Neg Hx    Esophageal cancer Neg Hx    Rectal cancer Neg Hx    Stomach cancer Neg Hx     Social History   Socioeconomic History   Marital status: Married    Spouse name: Not on file   Number of children: Not on file   Years of  education: Not on file   Highest education level: Not on file  Occupational History   Not on file  Tobacco Use   Smoking status: Former    Packs/day: 1.00    Years: 15.00    Total pack years: 15.00    Types: Cigarettes    Quit date: 01/19/1988    Years since quitting: 34.9   Smokeless tobacco: Current    Types: Chew  Vaping Use   Vaping Use: Never used  Substance and Sexual Activity   Alcohol use: No   Drug use: No   Sexual activity: Yes  Other Topics Concern   Not on file  Social History Narrative   Exercise-walking   Social Determinants of Health   Financial Resource Strain: Not on file  Food Insecurity: Not on file  Transportation Needs: Not on file  Physical Activity: Not on file  Stress: Not on file  Social Connections: Not on file  Intimate Partner Violence: Not on file    Physical Exam: Vital  signs in last 24 hours: '@BP'$  (!) 170/91   Pulse 82   Temp (!) 97.3 F (36.3 C) (Skin)   Ht '5\' 7"'$  (1.702 m)   Wt 155 lb (70.3 kg)   SpO2 98%   BMI 24.28 kg/m  GEN: NAD EYE: Sclerae anicteric ENT: MMM CV: Non-tachycardic Pulm: CTA b/l GI: Soft, NT/ND NEURO:  Alert & Oriented x 3   Zenovia Jarred, MD [facility]  12/14/2022 1:41 PM

## 2022-12-14 NOTE — Progress Notes (Signed)
Report to pacu rn. Vss. Care resumed by rn. 

## 2022-12-14 NOTE — Patient Instructions (Addendum)
Patient has a contact number available for emergencies. The signs and symptoms of potential delayed complications were discussed with the patient. Return to normal activities tomorrow. Written discharge instructions were provided to the patient. - Resume previous diet. - Continue present medication  YOU HAD AN ENDOSCOPIC PROCEDURE TODAY: Refer to the procedure report and other information in the discharge instructions given to you for any specific questions about what was found during the examination. If this information does not answer your questions, please call [place] office at [phone] to clarify.   YOU SHOULD EXPECT: Some feelings of bloating in the abdomen. Passage of more gas than usual. Walking can help get rid of the air that was put into your GI tract during the procedure and reduce the bloating. If you had a lower endoscopy (such as a colonoscopy or flexible sigmoidoscopy) you may notice spotting of blood in your stool or on the toilet paper. Some abdominal soreness may be present for a day or two, also.  DIET: Your first meal following the procedure should be a light meal and then it is ok to progress to your normal diet. A half-sandwich or bowl of soup is an example of a good first meal. Heavy or fried foods are harder to digest and may make you feel nauseous or bloated. Drink plenty of fluids but you should avoid alcoholic beverages for 24 hours. If you had a esophageal dilation, please see attached instructions for diet.    ACTIVITY: Your care partner should take you home directly after the procedure. You should plan to take it easy, moving slowly for the rest of the day. You can resume normal activity the day after the procedure however YOU SHOULD NOT DRIVE, use power tools, machinery or perform tasks that involve climbing or major physical exertion for 24 hours (because of the sedation medicines used during the test).   SYMPTOMS TO REPORT IMMEDIATELY: A gastroenterologist can be  reached at any hour. Please call [phone]  for any of the following symptoms:  Following lower endoscopy (colonoscopy, flexible sigmoidoscopy) Excessive amounts of blood in the stool  Significant tenderness, worsening of abdominal pains  Swelling of the abdomen that is new, acute  Fever of 100 or higher  ls  FOLLOW UP:  If any biopsies were taken you will be contacted by phone or by letter within the next 1-3 weeks. Call [phone]  if you have not heard about the biopsies in 3 weeks.  Please also call with any specific questions about appointments or follow up tests.

## 2022-12-14 NOTE — Progress Notes (Signed)
Called to room to assist during endoscopic procedure.  Patient ID and intended procedure confirmed with present staff. Received instructions for my participation in the procedure from the performing physician.  

## 2022-12-14 NOTE — Op Note (Signed)
[facility] Patient Name: Drew [name] Procedure Date: 12/14/2022 1:43 PM MRN: [id number] Endoscopist: Jerene Bears , MD, [id number] Age: 67 Referring MD:  Date of Birth: September 14, 1956 Gender: Male Account #: [id number] Procedure:                Colonoscopy Indications:              High risk colon cancer surveillance: Personal                            history of multiple adenomas, Last colonoscopy:                            October 2018 Medicines:                Monitored Anesthesia Care Procedure:                Pre-Anesthesia Assessment:                           - Prior to the procedure, a History and Physical                            was performed, and patient medications and                            allergies were reviewed. The patient's tolerance of                            previous anesthesia was also reviewed. The risks                            and benefits of the procedure and the sedation                            options and risks were discussed with the patient.                            All questions were answered, and informed consent                            was obtained. Prior Anticoagulants: The patient has                            taken no anticoagulant or antiplatelet agents. ASA                            Grade Assessment: II - A patient with mild systemic                            disease. After reviewing the risks and benefits,                            the patient was deemed in satisfactory condition to  undergo the procedure.                           After obtaining informed consent, the colonoscope                            was passed under direct vision. Throughout the                            procedure, the patient's blood pressure, pulse, and                            oxygen saturations were monitored continuously. The                            Olympus PCF-[id number] (#[id number]) Colonoscope was                             introduced through the anus and advanced to the                            cecum, identified by appendiceal orifice and                            ileocecal valve. The colonoscopy was technically                            difficult and complex due to a redundant colon and                            significant looping. Successful completion of the                            procedure was aided by applying abdominal pressure.                            The patient tolerated the procedure well. The                            quality of the bowel preparation was good. The                            ileocecal valve, appendiceal orifice, and rectum                            were photographed. Scope In: 1:56:44 PM Scope Out: 2:21:19 PM Scope Withdrawal Time: 0 hours 10 minutes 22 seconds  Total Procedure Duration: 0 hours 24 minutes 35 seconds  Findings:                 The digital rectal exam was normal.                           Two sessile polyps were found in the ascending  colon. The polyps were 2 to 6 mm in size. These                            polyps were removed with a cold snare. Resection                            and retrieval were complete.                           Multiple medium-mouthed and small-mouthed                            diverticula were found in the sigmoid colon.                           The retroflexed view of the distal rectum and anal                            verge was normal and showed no anal or rectal                            abnormalities. Complications:            No immediate complications. Estimated Blood Loss:     Estimated blood loss: none. Impression:               - Two 2 to 6 mm polyps in the ascending colon,                            removed with a cold snare. Resected and retrieved.                           - Mild diverticulosis in the sigmoid colon.                           - The distal rectum  and anal verge are normal on                            retroflexion view. Recommendation:           - Patient has a contact number available for                            emergencies. The signs and symptoms of potential                            delayed complications were discussed with the                            patient. Return to normal activities tomorrow.                            Written discharge instructions were provided to the  patient.                           - Resume previous diet.                           - Continue present medications.                           - Await pathology results.                           - Repeat colonoscopy is recommended for                            surveillance. The colonoscopy date will be                            determined after pathology results from today's                            exam become available for review. Jerene Bears, MD 12/14/2022 2:23:39 PM This report has been signed electronically.

## 2022-12-14 NOTE — Progress Notes (Signed)
Pt's states no medical or surgical changes since previsit or office visit. VS assessed by D.T 

## 2022-12-17 ENCOUNTER — Telehealth: Payer: Self-pay | Admitting: *Deleted

## 2022-12-17 NOTE — Telephone Encounter (Signed)
Attempted to call patient for the post-procedure follow-up call. No answer. Left voicemail.

## 2022-12-20 ENCOUNTER — Encounter: Payer: Self-pay | Admitting: Internal Medicine

## 2023-01-03 ENCOUNTER — Telehealth: Payer: Self-pay | Admitting: Family Medicine

## 2023-01-03 NOTE — Telephone Encounter (Signed)
LVM to schedule preventative visit

## 2023-01-04 ENCOUNTER — Other Ambulatory Visit: Payer: Self-pay | Admitting: Family Medicine

## 2023-01-04 DIAGNOSIS — I10 Essential (primary) hypertension: Secondary | ICD-10-CM

## 2023-01-04 DIAGNOSIS — E785 Hyperlipidemia, unspecified: Secondary | ICD-10-CM

## 2023-01-09 ENCOUNTER — Ambulatory Visit (INDEPENDENT_AMBULATORY_CARE_PROVIDER_SITE_OTHER): Payer: Medicare Other

## 2023-01-09 VITALS — Wt 155.0 lb

## 2023-01-09 DIAGNOSIS — Z0000 Encounter for general adult medical examination without abnormal findings: Secondary | ICD-10-CM | POA: Diagnosis not present

## 2023-01-09 NOTE — Patient Instructions (Signed)
[name] , Thank you for taking time to come for your Medicare Wellness Visit. I appreciate your ongoing commitment to your health goals. Please review the following plan we discussed and let me know if I can assist you in the future.   These are the goals we discussed:  Goals      Patient Stated     Stay healthy        This is a list of the screening recommended for you and due dates:  Health Maintenance  Topic Date Due   COVID-19 Vaccine (1) Never done   Zoster (Shingles) Vaccine (1 of 2) Never done   DTaP/Tdap/Td vaccine (3 - Td or Tdap) 12/10/2021   Pneumonia Vaccine (1 - PCV) 01/12/2023*   Flu Shot  10/10/2054*   Medicare Annual Wellness Visit  01/10/2024   Colon Cancer Screening  12/14/2029   Hepatitis C Screening: USPSTF Recommendation to screen - Ages 18-79 yo.  Completed   HPV Vaccine  Aged Out  *Topic was postponed. The date shown is not the original due date.    Advanced directives: copies in chart   Conditions/risks identified: stay healthy and active   Next appointment: Follow up in one year for your annual wellness visit.   Preventive Care 68 Years and Older, Male  Preventive care refers to lifestyle choices and visits with your health care provider that can promote health and wellness. What does preventive care include? A yearly physical exam. This is also called an annual well check. Dental exams once or twice a year. Routine eye exams. Ask your health care provider how often you should have your eyes checked. Personal lifestyle choices, including: Daily care of your teeth and gums. Regular physical activity. Eating a healthy diet. Avoiding tobacco and drug use. Limiting alcohol use. Practicing safe sex. Taking low doses of aspirin every day. Taking vitamin and mineral supplements as recommended by your health care provider. What happens during an annual well check? The services and screenings done by your health care provider during your annual well  check will depend on your age, overall health, lifestyle risk factors, and family history of disease. Counseling  Your health care provider may ask you questions about your: Alcohol use. Tobacco use. Drug use. Emotional well-being. Home and relationship well-being. Sexual activity. Eating habits. History of falls. Memory and ability to understand (cognition). Work and work Statistician. Screening  You may have the following tests or measurements: Height, weight, and BMI. Blood pressure. Lipid and cholesterol levels. These may be checked every 5 years, or more frequently if you are over 38 years old. Skin check. Lung cancer screening. You may have this screening every year starting at age 68 if you have a 30-pack-year history of smoking and currently smoke or have quit within the past 15 years. Fecal occult blood test (FOBT) of the stool. You may have this test every year starting at age 68 Flexible sigmoidoscopy or colonoscopy. You may have a sigmoidoscopy every 5 years or a colonoscopy every 10 years starting at age 68. Prostate cancer screening. Recommendations will vary depending on your family history and other risks. Hepatitis C blood test. Hepatitis B blood test. Sexually transmitted disease (STD) testing. Diabetes screening. This is done by checking your blood sugar (glucose) after you have not eaten for a while (fasting). You may have this done every 1-3 years. Abdominal aortic aneurysm (AAA) screening. You may need this if you are a current or former smoker. Osteoporosis. You may be screened  starting at age 68 if you are at high risk. Talk with your health care provider about your test results, treatment options, and if necessary, the need for more tests. Vaccines  Your health care provider may recommend certain vaccines, such as: Influenza vaccine. This is recommended every year. Tetanus, diphtheria, and acellular pertussis (Tdap, Td) vaccine. You may need a Td booster  every 10 years. Zoster vaccine. You may need this after age 69. Pneumococcal 13-valent conjugate (PCV13) vaccine. One dose is recommended after age 68. Pneumococcal polysaccharide (PPSV23) vaccine. One dose is recommended after age 68. Talk to your health care provider about which screenings and vaccines you need and how often you need them. This information is not intended to replace advice given to you by your health care provider. Make sure you discuss any questions you have with your health care provider. Document Released: 12/23/2015 Document Revised: 08/15/2016 Document Reviewed: 09/27/2015 Elsevier Interactive Patient Education  2017 [place] Prevention in the Home Falls can cause injuries. They can happen to people of all ages. There are many things you can do to make your home safe and to help prevent falls. What can I do on the outside of my home? Regularly fix the edges of walkways and driveways and fix any cracks. Remove anything that might make you trip as you walk through a door, such as a raised step or threshold. Trim any bushes or trees on the path to your home. Use bright outdoor lighting. Clear any walking paths of anything that might make someone trip, such as rocks or tools. Regularly check to see if handrails are loose or broken. Make sure that both sides of any steps have handrails. Any raised decks and porches should have guardrails on the edges. Have any leaves, snow, or ice cleared regularly. Use sand or salt on walking paths during winter. Clean up any spills in your garage right away. This includes oil or grease spills. What can I do in the bathroom? Use night lights. Install grab bars by the toilet and in the tub and shower. Do not use towel bars as grab bars. Use non-skid mats or decals in the tub or shower. If you need to sit down in the shower, use a plastic, non-slip stool. Keep the floor dry. Clean up any water that spills on the floor as soon  as it happens. Remove soap buildup in the tub or shower regularly. Attach bath mats securely with double-sided non-slip rug tape. Do not have throw rugs and other things on the floor that can make you trip. What can I do in the bedroom? Use night lights. Make sure that you have a light by your bed that is easy to reach. Do not use any sheets or blankets that are too big for your bed. They should not hang down onto the floor. Have a firm chair that has side arms. You can use this for support while you get dressed. Do not have throw rugs and other things on the floor that can make you trip. What can I do in the kitchen? Clean up any spills right away. Avoid walking on wet floors. Keep items that you use a lot in easy-to-reach places. If you need to reach something above you, use a strong step stool that has a grab bar. Keep electrical cords out of the way. Do not use floor polish or wax that makes floors slippery. If you must use wax, use non-skid floor wax. Do not have throw  rugs and other things on the floor that can make you trip. What can I do with my stairs? Do not leave any items on the stairs. Make sure that there are handrails on both sides of the stairs and use them. Fix handrails that are broken or loose. Make sure that handrails are as long as the stairways. Check any carpeting to make sure that it is firmly attached to the stairs. Fix any carpet that is loose or worn. Avoid having throw rugs at the top or bottom of the stairs. If you do have throw rugs, attach them to the floor with carpet tape. Make sure that you have a light switch at the top of the stairs and the bottom of the stairs. If you do not have them, ask someone to add them for you. What else can I do to help prevent falls? Wear shoes that: Do not have high heels. Have rubber bottoms. Are comfortable and fit you well. Are closed at the toe. Do not wear sandals. If you use a stepladder: Make sure that it is fully  opened. Do not climb a closed stepladder. Make sure that both sides of the stepladder are locked into place. Ask someone to hold it for you, if possible. Clearly mark and make sure that you can see: Any grab bars or handrails. First and last steps. Where the edge of each step is. Use tools that help you move around (mobility aids) if they are needed. These include: Canes. Walkers. Scooters. Crutches. Turn on the lights when you go into a dark area. Replace any light bulbs as soon as they burn out. Set up your furniture so you have a clear path. Avoid moving your furniture around. If any of your floors are uneven, fix them. If there are any pets around you, be aware of where they are. Review your medicines with your doctor. Some medicines can make you feel dizzy. This can increase your chance of falling. Ask your doctor what other things that you can do to help prevent falls. This information is not intended to replace advice given to you by your health care provider. Make sure you discuss any questions you have with your health care provider. Document Released: 09/22/2009 Document Revised: 05/03/2016 Document Reviewed: 12/31/2014 Elsevier Interactive Patient Education  2017 [employer].

## 2023-01-09 NOTE — Progress Notes (Deleted)
I connected with  Dione Housekeeper on 01/17/23 by a audio enabled telemedicine application and verified that I am speaking with the correct person using two identifiers.  Patient Location: Home  Provider Location: Home Office  I discussed the limitations of evaluation and management by telemedicine. The patient expressed understanding and agreed to proceed.   Subjective:   Drew [name] is a 67 y.o. male who presents for an Initial Medicare Annual Wellness Visit.  Review of Systems     Cardiac Risk Factors include: advanced age (>69mn, >>83women)     Objective:    Today's Vitals   01/09/23 1434  Weight: 155 lb (70.3 kg)   Body mass index is 24.28 kg/m.     01/09/2023    2:37 PM 10/25/2022    3:03 PM 05/18/2022    3:20 PM 12/22/2021    3:36 PM 08/28/2021    2:53 PM 04/26/2021    3:41 PM 01/24/2021    8:58 AM  Advanced Directives  Does Patient Have a Medical Advance Directive? Yes No  No No;Yes No No  Type of AParamedicof [place] will    [place] will    Does patient want to make changes to medical advance directive? No - Patient declined        Copy of [place] Chart? Yes - validated most recent copy scanned in chart (See row information)        Would patient like information on creating a medical advance directive?  No - Patient declined No - Patient declined No - Patient declined No - Patient declined No - Patient declined No - Patient declined    Current Medications (verified) Outpatient Encounter Medications as of 01/09/2023  Medication Sig   aspirin EC 325 MG tablet Take 1 tablet (325 mg total) by mouth daily.   lisinopril-hydrochlorothiazide (ZESTORETIC) 20-12.5 MG tablet TAKE 2 TABLETS BY MOUTH EVERY DAY   Omega-3 Fatty Acids (FISH OIL) 1200 MG CAPS Take 1,200 mg by mouth 2 (two) times daily.    pravastatin (PRAVACHOL) 40 MG tablet TAKE 1 TABLET BY MOUTH EVERY DAY   No  facility-administered encounter medications on file as of 01/09/2023.    Allergies (verified) Patient has no known allergies.   History: Past Medical History:  Diagnosis Date   [place]    [place]   High cholesterol    Hypertension    Memory loss    Tubular adenoma of colon    Past Surgical History:  Procedure Laterality Date   COLONOSCOPY     HERNIA REPAIR Left 2004   POLYPECTOMY     Family History  Problem Relation Age of Onset   Heart disease Mother        cFreda Jackson disease Mother    Heart disease Father    Breast cancer Sister    Hyperlipidemia Brother    Hypertension Brother    Hypertension Brother    Heart disease Paternal Uncle    Heart disease Paternal Uncle    Heart disease Paternal Uncle    Heart disease Paternal Uncle    Heart disease Paternal Uncle    Heart disease Paternal Uncle    Colon cancer Neg Hx    Colon polyps Neg Hx    Esophageal cancer Neg Hx    Rectal cancer Neg Hx    Stomach cancer Neg Hx    Social History  Socioeconomic History   Marital status: Married    Spouse name: Not on file   Number of children: Not on file   Years of education: Not on file   Highest education level: Not on file  Occupational History   Not on file  Tobacco Use   Smoking status: Former    Packs/day: 1.00    Years: 15.00    Total pack years: 15.00    Types: Cigarettes    Quit date: 01/19/1988    Years since quitting: 35.0   Smokeless tobacco: Current    Types: Chew  Vaping Use   Vaping Use: Never used  Substance and Sexual Activity   Alcohol use: No   Drug use: No   Sexual activity: Yes  Other Topics Concern   Not on file  Social History Narrative   Exercise-walking   Social Determinants of Health   Financial Resource Strain: Low Risk  (01/09/2023)   Overall Financial Resource Strain (CARDIA)    Difficulty of Paying Living Expenses: Not hard at all  Food Insecurity: No Food  Insecurity (01/09/2023)   Hunger Vital Sign    Worried About Running Out of Food in the Last Year: Never true    [place] in the Last Year: Never true  Transportation Needs: No Transportation Needs (01/09/2023)   PRAPARE - Hydrologist (Medical): No    Lack of Transportation (Non-Medical): No  Physical Activity: Sufficiently Active (01/09/2023)   Exercise Vital Sign    Days of Exercise per Week: 5 days    Minutes of Exercise per Session: 60 min  Stress: No Stress Concern Present (01/09/2023)   [place]    Feeling of Stress : Not at all  Social Connections: Moderately Integrated (01/09/2023)   Social Connection and Isolation Panel [NHANES]    Frequency of Communication with Friends and Family: More than three times a week    Frequency of Social Gatherings with Friends and Family: More than three times a week    Attends Religious Services: More than 4 times per year    Active Member of [employer] or Organizations: No    Attends Music therapist: Never    Marital Status: Married    Tobacco Counseling Ready to quit: Not Answered Counseling given: Not Answered   Clinical Intake:  Pre-visit preparation completed: Yes  Pain : No/denies pain     BMI - recorded: 24.28 Nutritional Status: BMI of 19-24  Normal Nutritional Risks: None Diabetes: No  How often do you need to have someone help you when you read instructions, pamphlets, or other written materials from your doctor or pharmacy?: 1 - Never  Diabetic?no  Interpreter Needed?: No  Information entered by :: Charlott Rakes, LPN   Activities of Daily Living    01/09/2023    2:39 PM  In your present state of health, do you have any difficulty performing the following activities:  Hearing? 0  Vision? 0  Difficulty concentrating or making decisions? 0  Walking or climbing stairs? 0  Dressing or bathing? 0  Doing  errands, shopping? 0  Preparing Food and eating ? N  Using the Toilet? N  In the past six months, have you accidently leaked urine? N  Do you have problems with loss of bowel control? N  Managing your Medications? N  Managing your Finances? N  Housekeeping or managing your Housekeeping? N    Patient Care Team:  [name], DO as PCP - General (Family Medicine)  Indicate any recent Medical Services you may have received from other than [facility] providers in the past year (date may be approximate).     Assessment:   This is a routine wellness examination for [place].  Hearing/Vision screen Hearing Screening - Comments:: Pt denies any hearing issues  Vision Screening - Comments:: Pt follows up with provider for annual eye exams   Dietary issues and exercise activities discussed:     Goals Addressed             This Visit's Progress    Patient Stated       Stay healthy       Depression Screen    01/09/2023    2:37 PM 01/12/2022    9:43 AM 04/21/2020    3:33 PM 11/29/2016    4:01 PM  PHQ 2/9 Scores  PHQ - 2 Score 0 0 0 0    Fall Risk    01/09/2023    2:39 PM 01/12/2022    9:43 AM 11/29/2016    4:01 PM  [place] in the past year? 0 0 No  Number falls in past yr: 0 0   Injury with Fall? 0 0   Risk for fall due to : Impaired vision    Follow up Falls prevention discussed Falls evaluation completed     FALL RISK PREVENTION PERTAINING TO THE HOME:  Any stairs in or around the home? Yes  If so, are there any without handrails? No  Home free of loose throw rugs in walkways, pet beds, electrical cords, etc? Yes  Adequate lighting in your home to reduce risk of falls? Yes   ASSISTIVE DEVICES UTILIZED TO PREVENT FALLS:  Life alert? No  Use of a cane, walker or w/c? No  Grab bars in the bathroom? No  Shower chair or bench in shower? No  Elevated toilet seat or a handicapped toilet? No   TIMED UP AND GO:  Was the test performed? No .   Cognitive  Function:        01/15/2023    9:31 AM  6CIT Screen  What Year? 0 points  What month? 0 points  What time? 0 points  Count back from 20 0 points  Months in reverse 0 points  Repeat phrase 0 points  Total Score 0 points    Immunizations Immunization History  Administered Date(s) Administered   Tdap 05/05/2011, 12/11/2011    TDAP status: Due, Education has been provided regarding the importance of this vaccine. Advised may receive this vaccine at local pharmacy or Health Dept. Aware to provide a copy of the vaccination record if obtained from local pharmacy or Health Dept. Verbalized acceptance and understanding.  Flu Vaccine status: Declined, Education has been provided regarding the importance of this vaccine but patient still declined. Advised may receive this vaccine at local pharmacy or Health Dept. Aware to provide a copy of the vaccination record if obtained from local pharmacy or Health Dept. Verbalized acceptance and understanding.  Pneumococcal vaccine status: Declined,  Education has been provided regarding the importance of this vaccine but patient still declined. Advised may receive this vaccine at local pharmacy or Health Dept. Aware to provide a copy of the vaccination record if obtained from local pharmacy or Health Dept. Verbalized acceptance and understanding.   Covid-19 vaccine status: Declined, Education has been provided regarding the importance of this vaccine but patient still declined. Advised  may receive this vaccine at local pharmacy or Health Dept.or vaccine clinic. Aware to provide a copy of the vaccination record if obtained from local pharmacy or Health Dept. Verbalized acceptance and understanding.  Qualifies for Shingles Vaccine? Yes   Zostavax completed No   Shingrix Completed?: No.    Education has been provided regarding the importance of this vaccine. Patient has been advised to call insurance company to determine out of pocket expense if they have not  yet received this vaccine. Advised may also receive vaccine at local pharmacy or Health Dept. Verbalized acceptance and understanding.  Screening Tests Health Maintenance  Topic Date Due   COVID-19 Vaccine (1) Never done   Zoster Vaccines- Shingrix (1 of 2) Never done   Pneumonia Vaccine 67+ Years old (1 of 1 - PCV) Never done   DTaP/Tdap/Td (3 - Td or Tdap) 12/10/2021   INFLUENZA VACCINE  10/10/2054 (Originally 07/10/2022)   Medicare Annual Wellness (AWV)  01/10/2024   COLONOSCOPY (Pts 45-18yr Insurance coverage will need to be confirmed)  12/14/2029   Hepatitis C Screening  Completed   HPV VACCINES  Aged Out    Health Maintenance  Health Maintenance Due  Topic Date Due   COVID-19 Vaccine (1) Never done   Zoster Vaccines- Shingrix (1 of 2) Never done   Pneumonia Vaccine 68 Years old (1 of 1 - PCV) Never done   DTaP/Tdap/Td (3 - Td or Tdap) 12/10/2021    Colorectal cancer screening: Type of screening: Colonoscopy. Completed 12/14/22. Repeat every 7 years  Additional Screening:  Hepatitis C Screening  Completed 01/24/17  Vision Screening: Recommended annual ophthalmology exams for early detection of glaucoma and other disorders of the eye. Is the patient up to date with their annual eye exam?  Yes  Who is the provider or what is the name of the office in which the patient attends annual eye exams[place]  If pt is not established with a provider, would they like to be referred to a provider to establish care? No .   Dental Screening: Recommended annual dental exams for proper oral hygiene  Community Resource Referral / Chronic Care Management: CRR required this visit?  No   CCM required this visit?  No      Plan:     I have personally reviewed and noted the following in the patient's chart:   Medical and social history Use of alcohol, tobacco or illicit drugs  Current medications and supplements including opioid prescriptions. Patient is  not currently taking opioid prescriptions. Functional ability and status Nutritional status Physical activity Advanced directives List of other physicians Hospitalizations, surgeries, and ER visits in previous 12 months Vitals Screenings to include cognitive, depression, and falls Referrals and appointments  In addition, I have reviewed and discussed with patient certain preventive protocols, quality metrics, and best practice recommendations. A written personalized care plan for preventive services as well as general preventive health recommendations were provided to patient.     TWillette Brace LPN   [id number]  Nurse Notes: none

## 2023-01-10 NOTE — Progress Notes (Signed)
I have reviewed and agree with [facility] documentation.  Kathlene November, MD

## 2023-01-21 NOTE — Progress Notes (Signed)
I connected with  Drew [name] on 01/21/23 by a audio enabled telemedicine application and verified that I am speaking with the correct person using two identifiers.  Patient Location: Home  Provider Location: Home Office  I discussed the limitations of evaluation and management by telemedicine. The patient expressed understanding and agreed to proceed.  Patient Medicare AWV questionnaire was completed by the patient on 01/09/23; I have confirmed that all information answered by patient is correct and no changes since this date.  Pt cognition was done on 01/09/23 it was added later due to it was not turned over to same date       Subjective:   Drew [name] is a 67 y.o. male who presents for an Initial Medicare Annual Wellness Visit.  Review of Systems     Cardiac Risk Factors include: advanced age (>59mn, >>25women)     Objective:    Today's Vitals   01/09/23 1434  Weight: 155 lb (70.3 kg)   Body mass index is 24.28 kg/m.     01/09/2023    2:37 PM 10/25/2022    3:03 PM 05/18/2022    3:20 PM 12/22/2021    3:36 PM 08/28/2021    2:53 PM 04/26/2021    3:41 PM 01/24/2021    8:58 AM  Advanced Directives  Does Patient Have a Medical Advance Directive? Yes No  No No;Yes No No  Type of AParamedicof [place] will    [place] will    Does patient want to make changes to medical advance directive? No - Patient declined        Copy of [place] Chart? Yes - validated most recent copy scanned in chart (See row information)        Would patient like information on creating a medical advance directive?  No - Patient declined No - Patient declined No - Patient declined No - Patient declined No - Patient declined No - Patient declined    Current Medications (verified) Outpatient Encounter Medications as of 01/09/2023  Medication Sig   aspirin EC 325 MG tablet Take 1 tablet (325 mg total) by mouth daily.    lisinopril-hydrochlorothiazide (ZESTORETIC) 20-12.5 MG tablet TAKE 2 TABLETS BY MOUTH EVERY DAY   Omega-3 Fatty Acids (FISH OIL) 1200 MG CAPS Take 1,200 mg by mouth 2 (two) times daily.    pravastatin (PRAVACHOL) 40 MG tablet TAKE 1 TABLET BY MOUTH EVERY DAY   No facility-administered encounter medications on file as of 01/09/2023.    Allergies (verified) Patient has no known allergies.   History: Past Medical History:  Diagnosis Date   [place]    [place]   High cholesterol    Hypertension    Memory loss    Tubular adenoma of colon    Past Surgical History:  Procedure Laterality Date   COLONOSCOPY     HERNIA REPAIR Left 2004   POLYPECTOMY     Family History  Problem Relation Age of Onset   Heart disease Mother        cFreda Jackson disease Mother    Heart disease Father    Breast cancer Sister    Hyperlipidemia Brother    Hypertension Brother    Hypertension Brother    Heart disease Paternal Uncle    Heart disease Paternal Uncle    Heart disease Paternal Uncle    Heart disease Paternal Uncle  Heart disease Paternal Uncle    Heart disease Paternal Uncle    Colon cancer Neg Hx    Colon polyps Neg Hx    Esophageal cancer Neg Hx    Rectal cancer Neg Hx    Stomach cancer Neg Hx    Social History   Socioeconomic History   Marital status: Married    Spouse name: Not on file   Number of children: Not on file   Years of education: Not on file   Highest education level: Not on file  Occupational History   Not on file  Tobacco Use   Smoking status: Former    Packs/day: 1.00    Years: 15.00    Total pack years: 15.00    Types: Cigarettes    Quit date: 01/19/1988    Years since quitting: 35.0   Smokeless tobacco: Current    Types: Chew  Vaping Use   Vaping Use: Never used  Substance and Sexual Activity   Alcohol use: No   Drug use: No   Sexual activity: Yes  Other Topics Concern   Not on  file  Social History Narrative   Exercise-walking   Social Determinants of Health   Financial Resource Strain: Low Risk  (01/09/2023)   Overall Financial Resource Strain (CARDIA)    Difficulty of Paying Living Expenses: Not hard at all  Food Insecurity: No Food Insecurity (01/09/2023)   Hunger Vital Sign    Worried About Running Out of Food in the Last Year: Never true    Ran Out of Food in the Last Year: Never true  Transportation Needs: No Transportation Needs (01/09/2023)   PRAPARE - Hydrologist (Medical): No    Lack of Transportation (Non-Medical): No  Physical Activity: Sufficiently Active (01/09/2023)   Exercise Vital Sign    Days of Exercise per Week: 5 days    Minutes of Exercise per Session: 60 min  Stress: No Stress Concern Present (01/09/2023)   [place]    Feeling of Stress : Not at all  Social Connections: Moderately Integrated (01/09/2023)   Social Connection and Isolation Panel [NHANES]    Frequency of Communication with Friends and Family: More than three times a week    Frequency of Social Gatherings with Friends and Family: More than three times a week    Attends Religious Services: More than 4 times per year    Active Member of [employer] or Organizations: No    Attends Music therapist: Never    Marital Status: Married    Tobacco Counseling Ready to quit: Not Answered Counseling given: Not Answered   Clinical Intake:  Pre-visit preparation completed: Yes  Pain : No/denies pain     BMI - recorded: 24.28 Nutritional Status: BMI of 19-24  Normal Nutritional Risks: None Diabetes: No  How often do you need to have someone help you when you read instructions, pamphlets, or other written materials from your doctor or pharmacy?: 1 - Never  Diabetic?no  Interpreter Needed?: No  Information entered by :: Charlott Rakes, LPN   Activities of Daily  Living    01/09/2023    2:39 PM  In your present state of health, do you have any difficulty performing the following activities:  Hearing? 0  Vision? 0  Difficulty concentrating or making decisions? 0  Walking or climbing stairs? 0  Dressing or bathing? 0  Doing errands, shopping? 0  Preparing Food and eating ?  N  Using the Toilet? N  In the past six months, have you accidently leaked urine? N  Do you have problems with loss of bowel control? N  Managing your Medications? N  Managing your Finances? N  Housekeeping or managing your Housekeeping? N    Patient Care Team: Carollee Herter, Alferd Apa, DO as PCP - General (Family Medicine)  Indicate any recent Medical Services you may have received from other than [facility] providers in the past year (date may be approximate).     Assessment:   This is a routine wellness examination for Ranald.  Hearing/Vision screen Hearing Screening - Comments:: Pt denies any hearing issues  Vision Screening - Comments:: Pt follows up with provider for annual eye exams   Dietary issues and exercise activities discussed:     Goals Addressed             This Visit's Progress    Patient Stated       Stay healthy      Depression Screen    01/09/2023    2:37 PM 01/12/2022    9:43 AM 04/21/2020    3:33 PM 11/29/2016    4:01 PM  PHQ 2/9 Scores  PHQ - 2 Score 0 0 0 0    Fall Risk    01/09/2023    2:39 PM 01/12/2022    9:43 AM 11/29/2016    4:01 PM  [place] in the past year? 0 0 No  Number falls in past yr: 0 0   Injury with Fall? 0 0   Risk for fall due to : Impaired vision    Follow up Falls prevention discussed Falls evaluation completed     FALL RISK PREVENTION PERTAINING TO THE HOME:  Any stairs in or around the home? Yes  If so, are there any without handrails? No  Home free of loose throw rugs in walkways, pet beds, electrical cords, etc? Yes  Adequate lighting in your home to reduce risk of falls? Yes   ASSISTIVE  DEVICES UTILIZED TO PREVENT FALLS:  Life alert? No  Use of a cane, walker or w/c? No  Grab bars in the bathroom? No  Shower chair or bench in shower? No  Elevated toilet seat or a handicapped toilet? No   TIMED UP AND GO:  Was the test performed? No .   Cognitive Function:        01/15/2023    9:31 AM  6CIT Screen  What Year? 0 points  What month? 0 points  What time? 0 points  Count back from 20 0 points  Months in reverse 0 points  Repeat phrase 0 points  Total Score 0 points    Immunizations Immunization History  Administered Date(s) Administered   Tdap 05/05/2011, 12/11/2011    TDAP status: Due, Education has been provided regarding the importance of this vaccine. Advised may receive this vaccine at local pharmacy or Health Dept. Aware to provide a copy of the vaccination record if obtained from local pharmacy or Health Dept. Verbalized acceptance and understanding.  Flu Vaccine status: Declined, Education has been provided regarding the importance of this vaccine but patient still declined. Advised may receive this vaccine at local pharmacy or Health Dept. Aware to provide a copy of the vaccination record if obtained from local pharmacy or Health Dept. Verbalized acceptance and understanding.  Pneumococcal vaccine status: Declined,  Education has been provided regarding the importance of this vaccine but patient still declined. Advised may  receive this vaccine at local pharmacy or Health Dept. Aware to provide a copy of the vaccination record if obtained from local pharmacy or Health Dept. Verbalized acceptance and understanding.   Covid-19 vaccine status: Declined, Education has been provided regarding the importance of this vaccine but patient still declined. Advised may receive this vaccine at local pharmacy or Health Dept.or vaccine clinic. Aware to provide a copy of the vaccination record if obtained from local pharmacy or Health Dept. Verbalized acceptance and  understanding.  Qualifies for Shingles Vaccine? Yes   Zostavax completed No   Shingrix Completed?: No.    Education has been provided regarding the importance of this vaccine. Patient has been advised to call insurance company to determine out of pocket expense if they have not yet received this vaccine. Advised may also receive vaccine at local pharmacy or Health Dept. Verbalized acceptance and understanding.  Screening Tests Health Maintenance  Topic Date Due   COVID-19 Vaccine (1) Never done   Zoster Vaccines- Shingrix (1 of 2) Never done   Pneumonia Vaccine 4+ Years old (1 of 1 - PCV) Never done   DTaP/Tdap/Td (3 - Td or Tdap) 12/10/2021   INFLUENZA VACCINE  10/10/2054 (Originally 07/10/2022)   Medicare Annual Wellness (AWV)  01/10/2024   COLONOSCOPY (Pts 45-73yr Insurance coverage will need to be confirmed)  12/14/2029   Hepatitis C Screening  Completed   HPV VACCINES  Aged Out    Health Maintenance  Health Maintenance Due  Topic Date Due   COVID-19 Vaccine (1) Never done   Zoster Vaccines- Shingrix (1 of 2) Never done   Pneumonia Vaccine 68 Years old (1 of 1 - PCV) Never done   DTaP/Tdap/Td (3 - Td or Tdap) 12/10/2021    Colorectal cancer screening: Type of screening: Colonoscopy. Completed 12/14/22. Repeat every 7 years  Additional Screening:  Hepatitis C Screening  Completed 01/24/17  Vision Screening: Recommended annual ophthalmology exams for early detection of glaucoma and other disorders of the eye. Is the patient up to date with their annual eye exam?  Yes  Who is the provider or what is the name of the office in which the patient attends annual eye exams[place]  If pt is not established with a provider, would they like to be referred to a provider to establish care? No .   Dental Screening: Recommended annual dental exams for proper oral hygiene  Community Resource Referral / Chronic Care Management: CRR required this visit?  No    CCM required this visit?  No      Plan:     I have personally reviewed and noted the following in the patient's chart:   Medical and social history Use of alcohol, tobacco or illicit drugs  Current medications and supplements including opioid prescriptions. Patient is not currently taking opioid prescriptions. Functional ability and status Nutritional status Physical activity Advanced directives List of other physicians Hospitalizations, surgeries, and ER visits in previous 12 months Vitals Screenings to include cognitive, depression, and falls Referrals and appointments  In addition, I have reviewed and discussed with patient certain preventive protocols, quality metrics, and best practice recommendations. A written personalized care plan for preventive services as well as general preventive health recommendations were provided to patient.     TWillette Brace LPN   [id number]  Nurse Notes: none

## 2023-01-21 NOTE — Progress Notes (Signed)
I have reviewed and agree with [facility] documentation.  Kathlene November, MD

## 2023-03-07 ENCOUNTER — Ambulatory Visit (INDEPENDENT_AMBULATORY_CARE_PROVIDER_SITE_OTHER): Payer: Medicare Other | Admitting: Family Medicine

## 2023-03-07 ENCOUNTER — Encounter: Payer: Self-pay | Admitting: Family Medicine

## 2023-03-07 VITALS — BP 130/80 | HR 68 | Temp 98.2°F | Resp 18 | Ht 67.0 in | Wt 161.0 lb

## 2023-03-07 DIAGNOSIS — E785 Hyperlipidemia, unspecified: Secondary | ICD-10-CM

## 2023-03-07 DIAGNOSIS — I10 Essential (primary) hypertension: Secondary | ICD-10-CM

## 2023-03-07 DIAGNOSIS — R351 Nocturia: Secondary | ICD-10-CM | POA: Diagnosis not present

## 2023-03-07 DIAGNOSIS — I639 Cerebral infarction, unspecified: Secondary | ICD-10-CM

## 2023-03-07 NOTE — Patient Instructions (Signed)

## 2023-03-07 NOTE — Assessment & Plan Note (Signed)
Stable No new symptoms  

## 2023-03-07 NOTE — Assessment & Plan Note (Signed)
Well controlled, no changes to meds. Encouraged heart healthy diet such as the DASH diet and exercise as tolerated.  °

## 2023-03-07 NOTE — Assessment & Plan Note (Signed)
Tolerating statin, encouraged heart healthy diet, avoid trans fats, minimize simple carbs and saturated fats. Increase exercise as tolerated 

## 2023-03-07 NOTE — Progress Notes (Signed)
Subjective:   By signing my name below, I, Drew [name], attest that this documentation has been prepared under the direction and in the presence of Drew Held, DO. 03/07/2023   Patient ID: Drew [name], male    DOB: 01-25-1956, 67 y.o.   MRN: [id number]  Chief Complaint  Patient presents with   Hypertension   Hyperlipidemia   Follow-up    Hypertension Pertinent negatives include no blurred vision, chest pain, headaches, malaise/fatigue, palpitations or shortness of breath.  Hyperlipidemia Pertinent negatives include no chest pain or shortness of breath.   Patient is in today for a follow up visit  His blood pressure is doing well during this visit. He continues taking 25-12.5 mg lisinopril-hydrochlorothiazide daily PO and reports no new issues while taking it.  BP Readings from Last 3 Encounters:  03/07/23 130/80  12/14/22 114/62  10/25/22 127/86   Pulse Readings from Last 3 Encounters:  03/07/23 68  12/14/22 (!) 56  10/25/22 75   His memory is doing well at this time.  He has no changes in family medical history.  He continues using chewing tobacco products.  He is due for tetanus vaccine.  He is due for dental care and is planning on scheduling an appointment. He is due for vision care.   Past Medical History:  Diagnosis Date   CVA (cerebral vascular accident) ([place])    Hemochromatosis, hereditary ([place]) 08/28/2021   High cholesterol    Hypertension    Memory loss    Tubular adenoma of colon     Past Surgical History:  Procedure Laterality Date   COLONOSCOPY     HERNIA REPAIR Left 2004   POLYPECTOMY      Family History  Problem Relation Age of Onset   Heart disease Mother        Drew [name]' disease Mother    Heart disease Father    Breast cancer Sister    Hyperlipidemia Brother    Hypertension Brother    Hypertension Brother    Heart disease Paternal Uncle    Heart disease Paternal Uncle    Heart disease Paternal Uncle    Heart  disease Paternal Uncle    Heart disease Paternal Uncle    Heart disease Paternal Uncle    Colon cancer Neg Hx    Colon polyps Neg Hx    Esophageal cancer Neg Hx    Rectal cancer Neg Hx    Stomach cancer Neg Hx     Social History   Socioeconomic History   Marital status: Married    Spouse name: Not on file   Number of children: Not on file   Years of education: Not on file   Highest education level: 12th grade  Occupational History   Not on file  Tobacco Use   Smoking status: Former    Packs/day: 1.00    Years: 15.00    Additional pack years: 0.00    Total pack years: 15.00    Types: Cigarettes    Quit date: 01/19/1988    Years since quitting: 35.1   Smokeless tobacco: Current    Types: Chew  Vaping Use   Vaping Use: Never used  Substance and Sexual Activity   Alcohol use: No   Drug use: No   Sexual activity: Yes  Other Topics Concern   Not on file  Social History Narrative   Exercise-walking   Social Determinants of Health   Financial Resource Strain: Low Risk  (01/09/2023)  Overall Financial Resource Strain (CARDIA)    Difficulty of Paying Living Expenses: Not hard at all  Food Insecurity: No Food Insecurity (03/06/2023)   Hunger Vital Sign    Worried About Running Out of Food in the Last Year: Never true    Ran Out of Food in the Last Year: Never true  Transportation Needs: No Transportation Needs (03/06/2023)   PRAPARE - Hydrologist (Medical): No    Lack of Transportation (Non-Medical): No  Physical Activity: Sufficiently Active (03/06/2023)   Exercise Vital Sign    Days of Exercise per Week: 7 days    Minutes of Exercise per Session: 30 min  Stress: No Stress Concern Present (03/06/2023)   [place]    Feeling of Stress : Not at all  Social Connections: Moderately Integrated (03/06/2023)   Social Connection and Isolation Panel [NHANES]    Frequency of  Communication with Friends and Family: More than three times a week    Frequency of Social Gatherings with Friends and Family: More than three times a week    Attends Religious Services: More than 4 times per year    Active Member of [employer] or Organizations: No    Attends Archivist Meetings: Never    Marital Status: Married  Human resources officer Violence: Not At Risk (01/09/2023)   Humiliation, Afraid, Rape, and Kick questionnaire    Fear of Current or Ex-Partner: No    Emotionally Abused: No    Physically Abused: No    Sexually Abused: No    Outpatient Medications Prior to Visit  Medication Sig Dispense Refill   aspirin EC 325 MG tablet Take 1 tablet (325 mg total) by mouth daily. 30 tablet 0   lisinopril-hydrochlorothiazide (ZESTORETIC) 20-12.5 MG tablet TAKE 2 TABLETS BY MOUTH EVERY DAY 180 tablet 0   Omega-3 Fatty Acids (FISH OIL) 1200 MG CAPS Take 1,200 mg by mouth 2 (two) times daily.      pravastatin (PRAVACHOL) 40 MG tablet TAKE 1 TABLET BY MOUTH EVERY DAY 90 tablet 0   No facility-administered medications prior to visit.    No Known Allergies  Review of Systems  Constitutional:  Negative for fever and malaise/fatigue.  HENT:  Negative for congestion.   Eyes:  Negative for blurred vision.  Respiratory:  Negative for cough and shortness of breath.   Cardiovascular:  Negative for chest pain, palpitations and leg swelling.  Gastrointestinal:  Negative for vomiting.  Musculoskeletal:  Negative for back pain.  Skin:  Negative for rash.  Neurological:  Negative for loss of consciousness and headaches.       Objective:    Physical Exam Vitals and nursing note reviewed.  Constitutional:      General: He is not in acute distress.    Appearance: Normal appearance. He is not ill-appearing.  HENT:     Head: Normocephalic and atraumatic.     Right Ear: External ear normal.     Left Ear: External ear normal.  Eyes:     Extraocular Movements: Extraocular movements  intact.     Pupils: Pupils are equal, round, and reactive to light.  Cardiovascular:     Rate and Rhythm: Normal rate and regular rhythm.     Heart sounds: Normal heart sounds. No murmur heard.    No gallop.  Pulmonary:     Effort: Pulmonary effort is normal. No respiratory distress.     Breath sounds: Normal breath sounds. No  wheezing or rales.  Skin:    General: Skin is warm and dry.  Neurological:     Mental Status: He is alert and oriented to person, place, and time.  Psychiatric:        Judgment: Judgment normal.     BP 130/80 (BP Location: Left Arm, Patient Position: Sitting, Cuff Size: Normal)   Pulse 68   Temp 98.2 F (36.8 C) (Oral)   Resp 18   Ht 5\' 7"  (1.702 m)   Wt 161 lb (73 kg)   SpO2 95%   BMI 25.22 kg/m  Wt Readings from Last 3 Encounters:  03/07/23 161 lb (73 kg)  01/09/23 155 lb (70.3 kg)  12/14/22 155 lb (70.3 kg)       Assessment & Plan:  Essential hypertension -     CBC with Differential/Platelet -     Comprehensive metabolic panel -     Lipid panel -     PSA  Hyperlipidemia LDL goal <100 Assessment & Plan: Tolerating statin, encouraged heart healthy diet, avoid trans fats, minimize simple carbs and saturated fats. Increase exercise as tolerated   Orders: -     CBC with Differential/Platelet -     Comprehensive metabolic panel -     Lipid panel -     PSA  Nocturia -     PSA  Primary hypertension Assessment & Plan: Well controlled, no changes to meds. Encouraged heart healthy diet such as the DASH diet and exercise as tolerated.     Ischemic stroke [facility]) Assessment & Plan: Stable  No new symptoms      I, Drew Held, DO, personally preformed the services described in this documentation.  All medical record entries made by the scribe were at my direction and in my presence.  I have reviewed the chart and discharge instructions (if applicable) and agree that the record reflects my personal performance and is accurate and  complete. 03/07/2023   I,Drew [name],acting as a scribe for Drew Held, DO.,have documented all relevant documentation on the behalf of Drew Held, DO,as directed by  Drew Held, DO while in the presence of Drew Held, DO.   Drew Held, DO

## 2023-03-08 LAB — COMPREHENSIVE METABOLIC PANEL
AG Ratio: 1.6 (calc) (ref 1.0–2.5)
ALT: 10 U/L (ref 9–46)
AST: 16 U/L (ref 10–35)
Albumin: 3.9 g/dL (ref 3.6–5.1)
Alkaline phosphatase (APISO): 64 U/L (ref 35–144)
BUN: 11 mg/dL (ref 7–25)
CO2: 30 mmol/L (ref 20–32)
Calcium: 9.2 mg/dL (ref 8.6–10.3)
Chloride: 102 mmol/L (ref 98–110)
Creat: 0.98 mg/dL (ref 0.70–1.35)
Globulin: 2.4 g/dL (calc) (ref 1.9–3.7)
Glucose, Bld: 76 mg/dL (ref 65–99)
Potassium: 4.3 mmol/L (ref 3.5–5.3)
Sodium: 142 mmol/L (ref 135–146)
Total Bilirubin: 0.6 mg/dL (ref 0.2–1.2)
Total Protein: 6.3 g/dL (ref 6.1–8.1)

## 2023-03-08 LAB — CBC WITH DIFFERENTIAL/PLATELET
Absolute Monocytes: 622 cells/uL (ref 200–950)
Basophils Absolute: 92 cells/uL (ref 0–200)
Basophils Relative: 1.5 %
Eosinophils Absolute: 372 cells/uL (ref 15–500)
Eosinophils Relative: 6.1 %
HCT: 41 % (ref 38.5–50.0)
Hemoglobin: 14 g/dL (ref 13.2–17.1)
Lymphs Abs: 1934 cells/uL (ref 850–3900)
MCH: 30.4 pg (ref 27.0–33.0)
MCHC: 34.1 g/dL (ref 32.0–36.0)
MCV: 88.9 fL (ref 80.0–100.0)
MPV: 12.2 fL (ref 7.5–12.5)
Monocytes Relative: 10.2 %
Neutro Abs: 3081 cells/uL (ref 1500–7800)
Neutrophils Relative %: 50.5 %
Platelets: 209 10*3/uL (ref 140–400)
RBC: 4.61 10*6/uL (ref 4.20–5.80)
RDW: 12 % (ref 11.0–15.0)
Total Lymphocyte: 31.7 %
WBC: 6.1 10*3/uL (ref 3.8–10.8)

## 2023-03-08 LAB — PSA: PSA: 2.41 ng/mL (ref <=4.00)

## 2023-03-08 LAB — LIPID PANEL
Cholesterol: 145 mg/dL (ref <200)
HDL: 50 mg/dL (ref >=40)
LDL Cholesterol (Calc): 76 mg/dL (calc)
Non-HDL Cholesterol (Calc): 95 mg/dL (calc) (ref <130)
Total CHOL/HDL Ratio: 2.9 (calc) (ref <5.0)
Triglycerides: 109 mg/dL (ref <150)

## 2023-04-03 ENCOUNTER — Other Ambulatory Visit: Payer: Self-pay | Admitting: Family Medicine

## 2023-04-03 DIAGNOSIS — I10 Essential (primary) hypertension: Secondary | ICD-10-CM

## 2023-04-03 DIAGNOSIS — E785 Hyperlipidemia, unspecified: Secondary | ICD-10-CM

## 2023-04-12 ENCOUNTER — Encounter: Payer: Self-pay | Admitting: Family

## 2023-04-12 ENCOUNTER — Other Ambulatory Visit: Payer: Self-pay

## 2023-04-12 ENCOUNTER — Inpatient Hospital Stay (HOSPITAL_BASED_OUTPATIENT_CLINIC_OR_DEPARTMENT_OTHER): Payer: Medicare Other | Admitting: Family

## 2023-04-12 ENCOUNTER — Inpatient Hospital Stay: Payer: Medicare Other | Attending: Hematology & Oncology

## 2023-04-12 LAB — RETICULOCYTES
Immature Retic Fract: 11.3 % (ref 2.3–15.9)
RBC.: 4.2 MIL/uL — ABNORMAL LOW (ref 4.22–5.81)
Retic Count, Absolute: 58 10*3/uL (ref 19.0–186.0)
Retic Ct Pct: 1.4 % (ref 0.4–3.1)

## 2023-04-12 LAB — CMP (CANCER CENTER ONLY)
ALT: 7 U/L (ref 0–44)
AST: 16 U/L (ref 15–41)
Albumin: 3.6 g/dL (ref 3.5–5.0)
Alkaline Phosphatase: 53 U/L (ref 38–126)
Anion gap: 6 (ref 5–15)
BUN: 10 mg/dL (ref 8–23)
CO2: 31 mmol/L (ref 22–32)
Calcium: 8.9 mg/dL (ref 8.9–10.3)
Chloride: 101 mmol/L (ref 98–111)
Creatinine: 1.03 mg/dL (ref 0.61–1.24)
GFR, Estimated: >60 mL/min (ref >60)
Glucose, Bld: 90 mg/dL (ref 70–99)
Potassium: 4 mmol/L (ref 3.5–5.1)
Sodium: 138 mmol/L (ref 135–145)
Total Bilirubin: 0.6 mg/dL (ref 0.3–1.2)
Total Protein: 6.3 g/dL — ABNORMAL LOW (ref 6.5–8.1)

## 2023-04-12 LAB — CBC WITH DIFFERENTIAL (CANCER CENTER ONLY)
Abs Immature Granulocytes: 0.05 10*3/uL (ref 0.00–0.07)
Basophils Absolute: 0.1 10*3/uL (ref 0.0–0.1)
Basophils Relative: 2 %
Eosinophils Absolute: 0.4 10*3/uL (ref 0.0–0.5)
Eosinophils Relative: 6 %
HCT: 37.2 % — ABNORMAL LOW (ref 39.0–52.0)
Hemoglobin: 12.6 g/dL — ABNORMAL LOW (ref 13.0–17.0)
Immature Granulocytes: 1 %
Lymphocytes Relative: 31 %
Lymphs Abs: 1.8 10*3/uL (ref 0.7–4.0)
MCH: 30.1 pg (ref 26.0–34.0)
MCHC: 33.9 g/dL (ref 30.0–36.0)
MCV: 88.8 fL (ref 80.0–100.0)
Monocytes Absolute: 0.6 10*3/uL (ref 0.1–1.0)
Monocytes Relative: 11 %
Neutro Abs: 2.9 10*3/uL (ref 1.7–7.7)
Neutrophils Relative %: 49 %
Platelet Count: 185 10*3/uL (ref 150–400)
RBC: 4.19 MIL/uL — ABNORMAL LOW (ref 4.22–5.81)
RDW: 12.3 % (ref 11.5–15.5)
WBC Count: 5.8 10*3/uL (ref 4.0–10.5)
nRBC: 0 % (ref 0.0–0.2)

## 2023-04-12 LAB — FERRITIN: Ferritin: 10 ng/mL — ABNORMAL LOW (ref 24–336)

## 2023-04-12 NOTE — Progress Notes (Signed)
Hematology and Oncology Follow Up Visit  Drew [name] [id number] November 12, 1956 67 y.o. 04/12/2023   Principle Diagnosis:  Hemochromatosis, heterozygous for the H63D mutation   Current Therapy:        Phlebotomy to maintain ferritin < 100 and iron saturation < 30%   Interim History:  Drew [name] is here today for follow-up. He is doing well and has no complaints at this time.  He donates with the [employer] [facility] every 3 months.  No other blood loss. No bruising or petechiae.  No fever, chills, n/v, cough, rash, dizziness, SOB, chest pain, palpitations, abdominal pain or changes in bowel or bladder habits.  No swelling, tenderness, numbness or tingling in his extremities.  No falls or syncope.  Appetite and hydration are good. Weight is stable at 159 lbs.   ECOG Performance Status: 1 - Symptomatic but completely ambulatory  Medications:  Allergies as of 04/12/2023   No Known Allergies      Medication List        Accurate as of Apr 12, 2023  2:58 PM. If you have any questions, ask your nurse or doctor.          aspirin EC 325 MG tablet Take 1 tablet (325 mg total) by mouth daily.   Fish Oil 1200 MG Caps Take 1,200 mg by mouth 2 (two) times daily.   lisinopril-hydrochlorothiazide 20-12.5 MG tablet Commonly known as: ZESTORETIC TAKE 2 TABLETS BY MOUTH EVERY DAY   pravastatin 40 MG tablet Commonly known as: PRAVACHOL TAKE 1 TABLET BY MOUTH EVERY DAY        Allergies: No Known Allergies  Past Medical History, Surgical history, Social history, and Family History were reviewed and updated.  Review of Systems: All other 10 point review of systems is negative.   Physical Exam:  vitals were not taken for this visit.   Wt Readings from Last 3 Encounters:  03/07/23 161 lb (73 kg)  01/09/23 155 lb (70.3 kg)  12/14/22 155 lb (70.3 kg)    Ocular: Sclerae unicteric, pupils equal, round and reactive to light Ear-nose-throat: Oropharynx clear, dentition fair Lymphatic: No  cervical or supraclavicular adenopathy Lungs no rales or rhonchi, good excursion bilaterally Heart regular rate and rhythm, no murmur appreciated Abd soft, nontender, positive bowel sounds MSK no focal spinal tenderness, no joint edema Neuro: non-focal, well-oriented, appropriate affect Breasts: Deferred  Lab Results  Component Value Date   WBC 6.1 03/07/2023   HGB 14.0 03/07/2023   HCT 41.0 03/07/2023   MCV 88.9 03/07/2023   PLT 209 03/07/2023   Lab Results  Component Value Date   FERRITIN 27 10/25/2022   IRON 50 10/25/2022   TIBC 441 10/25/2022   UIBC 391 (H) 10/25/2022   IRONPCTSAT 11 (L) 10/25/2022   Lab Results  Component Value Date   RETICCTPCT 1.6 11/23/2020   RBC 4.61 03/07/2023   No results found for: "KPAFRELGTCHN", "LAMBDASER", "KAPLAMBRATIO" No results found for: "IGGSERUM", "IGA", "IGMSERUM" No results found for: "TOTALPROTELP", "ALBUMINELP", "A1GS", "A2GS", "BETS", "BETA2SER", "GAMS", "MSPIKE", "SPEI"   Chemistry      Component Value Date/Time   NA 142 03/07/2023 1538   K 4.3 03/07/2023 1538   CL 102 03/07/2023 1538   CO2 30 03/07/2023 1538   BUN 11 03/07/2023 1538   CREATININE 0.98 03/07/2023 1538      Component Value Date/Time   CALCIUM 9.2 03/07/2023 1538   ALKPHOS 71 10/25/2022 1446   AST 16 03/07/2023 1538   AST 19 10/25/2022 1446  ALT 10 03/07/2023 1538   ALT 12 10/25/2022 1446   BILITOT 0.6 03/07/2023 1538   BILITOT 0.7 10/25/2022 1446       Impression and Plan: Drew [name] is a very pleasant 67 yo caucasian gentleman with hemochromatosis, heterzygous for the H63D mutation.  Iron studies are pending.  He enjoys going to donate with the [employer]. He went last week on Wednesday.  Follow-up in 6 months.    Eileen Stanford, NP 5/3/20242:58 PM

## 2023-04-15 LAB — IRON AND IRON BINDING CAPACITY (CC-WL,HP ONLY)
Iron: 48 ug/dL (ref 45–182)
Saturation Ratios: 10 % — ABNORMAL LOW (ref 17.9–39.5)
TIBC: 480 ug/dL — ABNORMAL HIGH (ref 250–450)
UIBC: 432 ug/dL — ABNORMAL HIGH (ref 117–376)

## 2023-06-29 ENCOUNTER — Other Ambulatory Visit: Payer: Self-pay | Admitting: Family Medicine

## 2023-06-29 DIAGNOSIS — I10 Essential (primary) hypertension: Secondary | ICD-10-CM

## 2023-06-29 DIAGNOSIS — E785 Hyperlipidemia, unspecified: Secondary | ICD-10-CM

## 2023-07-23 IMAGING — CT CT HUMERUS*R* W/CM
2 of 3 series · 11 of 35 positions shown, 13 images · IV contrast (omnipaque)
Comparison: None.

CLINICAL DATA: A bulge is reported along the patient's right bicep
from months, nonpainful.

EXAM:
CT OF THE UPPER RIGHT EXTREMITY WITH CONTRAST
TECHNIQUE: Multidetector CT imaging of the upper right extremity was performed
according to the standard protocol following intravenous contrast
administration.
CONTRAST:  85mL OMNIPAQUE IOHEXOL 350 MG/ML SOLN

[Series 6: cor st rt humerus · coronal · 0.40mm/px · 3 of 176 slices shown]
[im 36/176  bone]
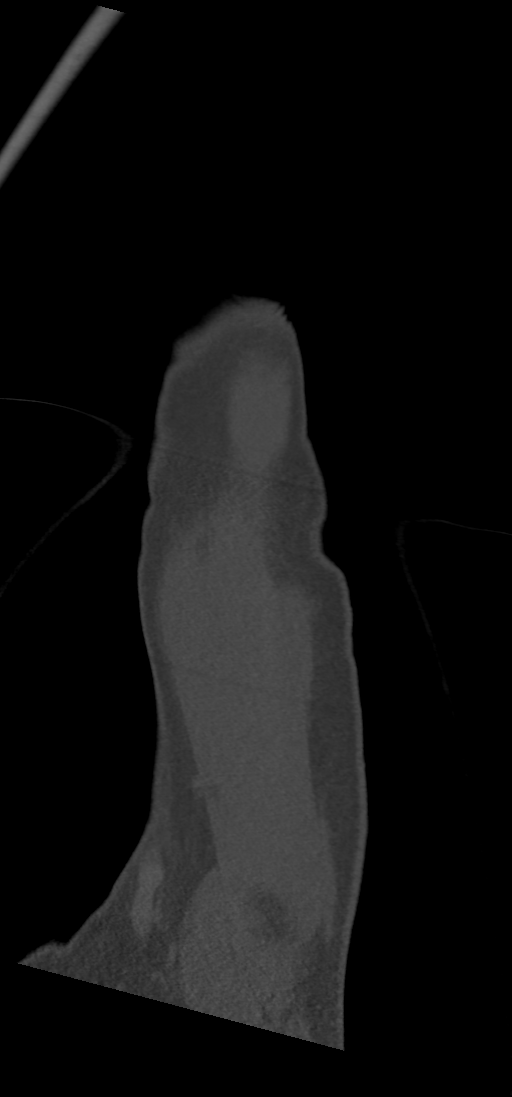
[im 71/176  bone]
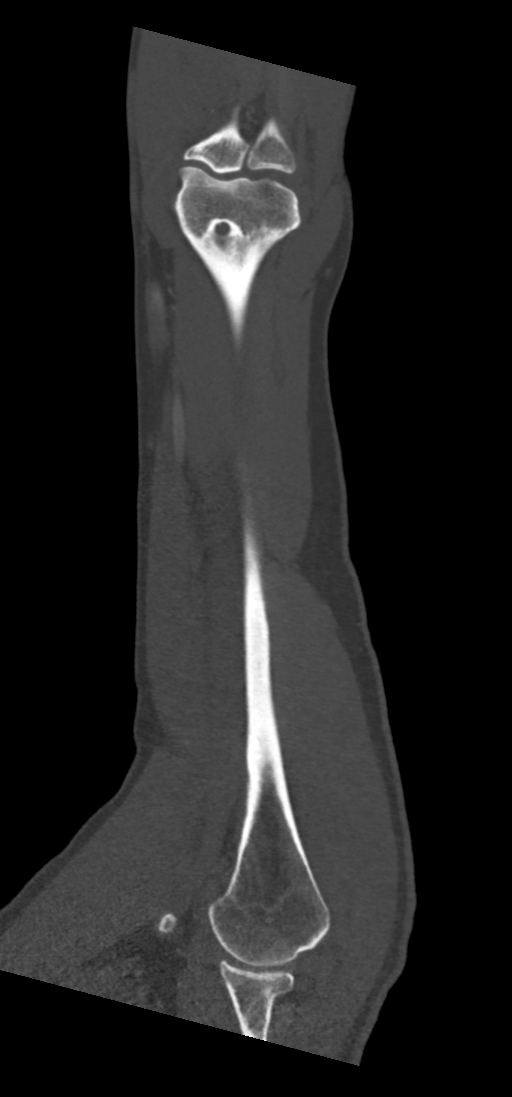
[im 106/176  bone]
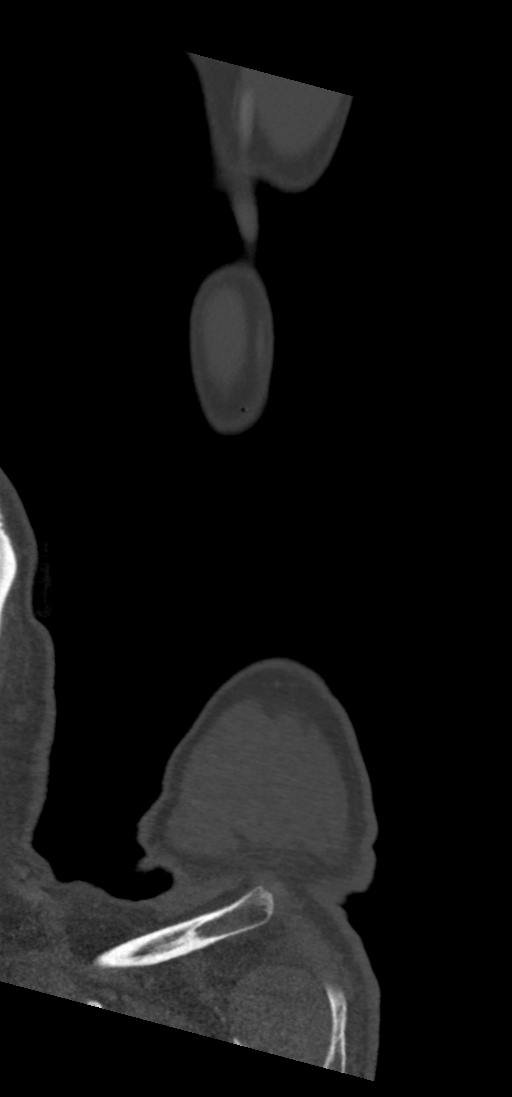

[Series 8: ax st rt humerus · axial · 0.34mm/px · z∈[+13,+343]mm · 8 of 406 slices shown, 10 images]
[im 32/406  soft-tissue]
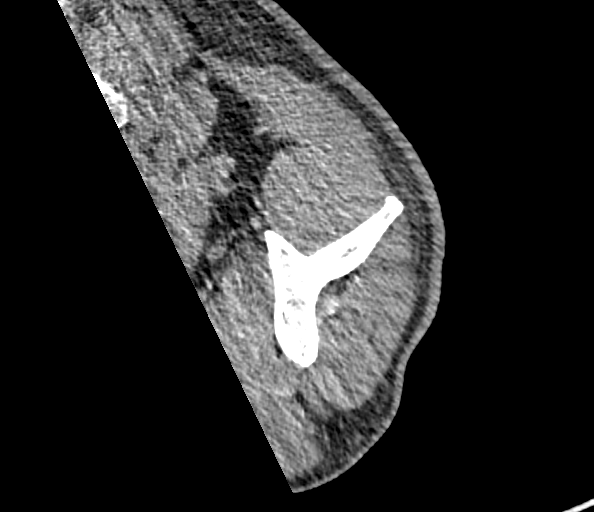
[im 32/406  bone]
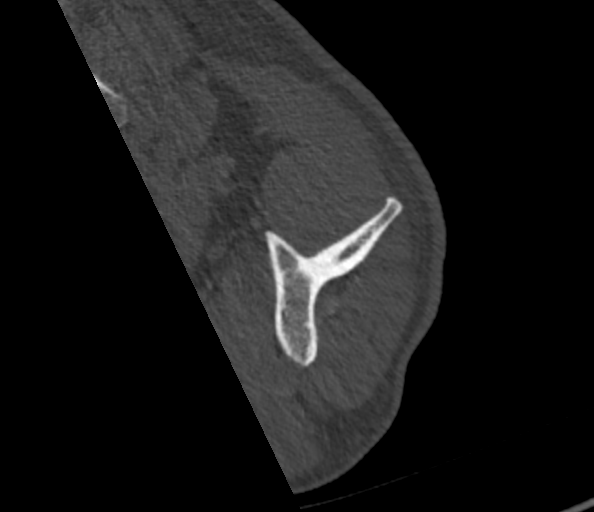
[im 94/406  bone]
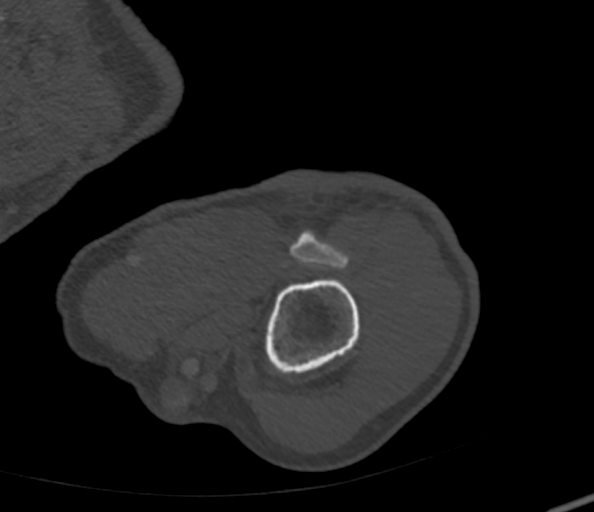
[im 125/406  bone]
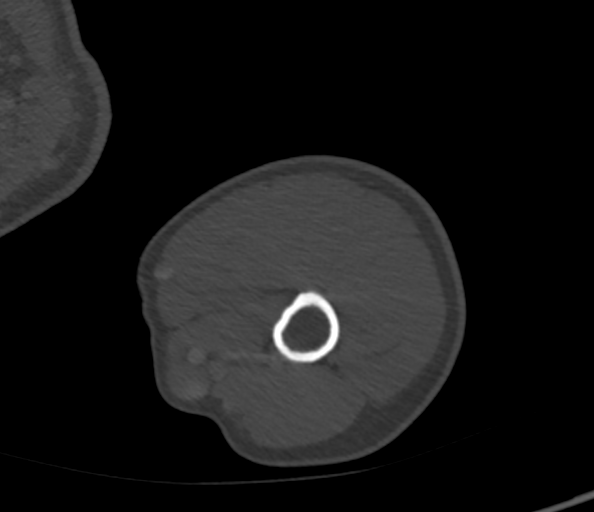
[im 187/406  bone]
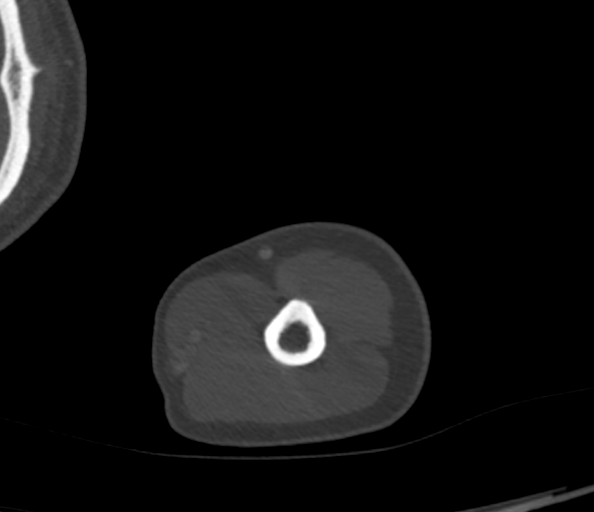
[im 219/406  soft-tissue]
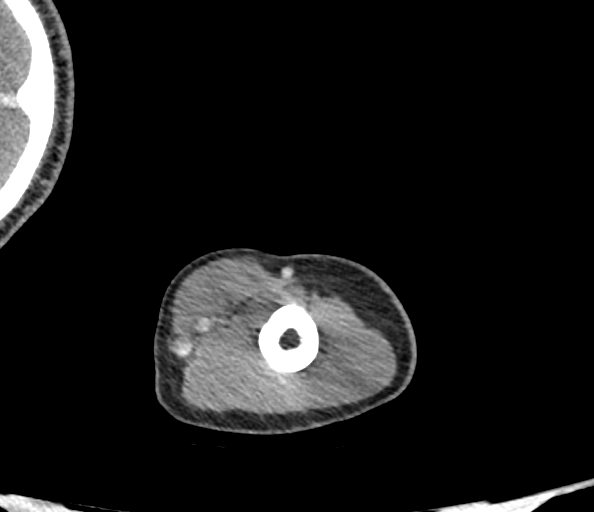
[im 219/406  bone]
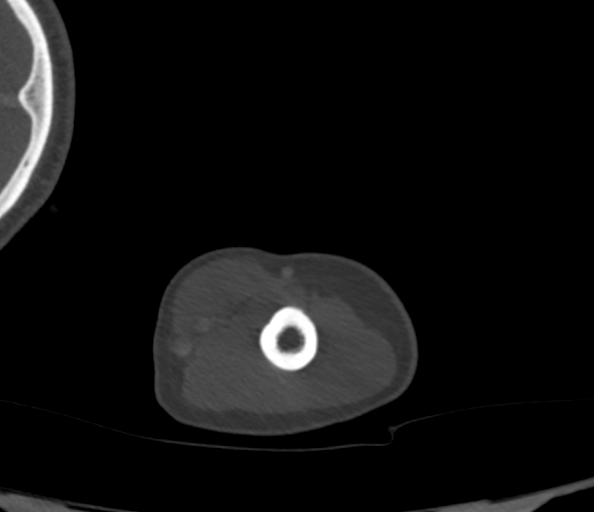
[im 281/406  bone]
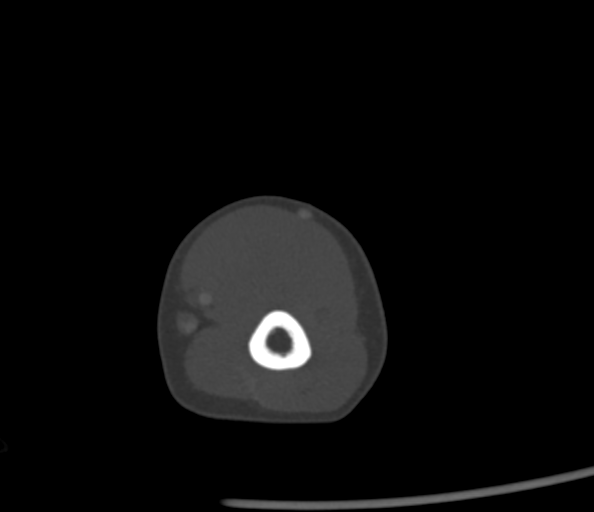
[im 312/406  bone]
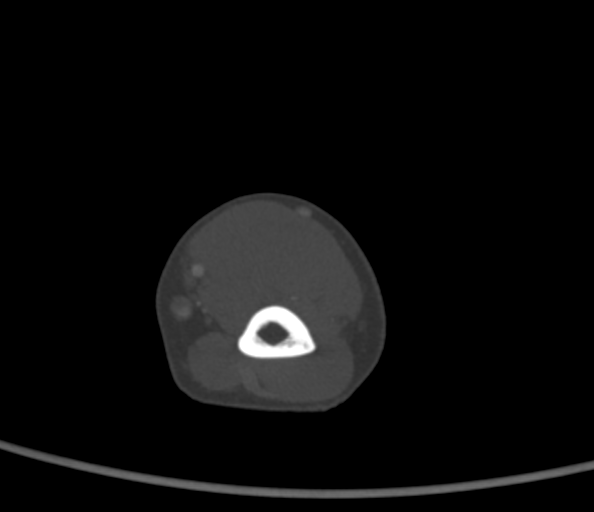
[im 374/406  bone]
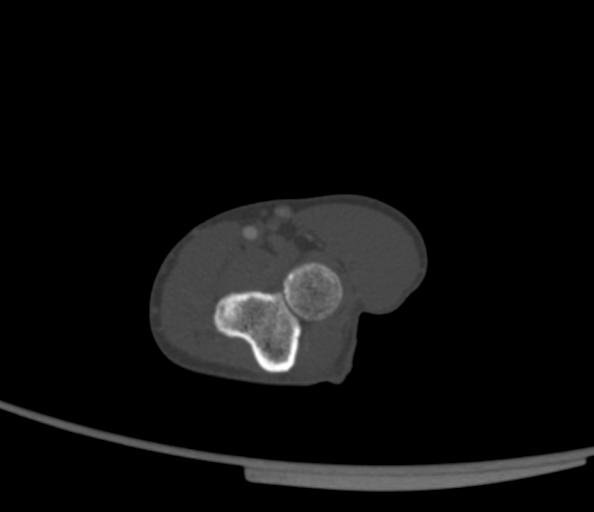

[11 of 35 positions shown; findings below may reference images not displayed]

FINDINGS: Bones/Joint/Cartilage

Unremarkable; please note that today's exam was performed with a
patient's arm raised up over his head in a position not optimized
for assessing the joints, particularly the shoulder joint. There is
no elbow joint effusion.

Ligaments

Suboptimally assessed by CT.

Muscles and Tendons

The marker indicating the region of concern is along the mid to
distal biceps overlying the muscle belly. There does appear to be
some prominence of the soft tissues of biceps in this region but
without differential enhancement. CT is not ideal for assessing the
biceps tendons, but the distal biceps tendon appears to be grossly
intact and attach at in the expected fashion to the bicipital
tuberosity.

My sense is that there is some indistinctness of fascia planes along
the margins of the proximal long head of the biceps muscle, for
example on images 240 through 155 of series 8. This implies some
form derangement of the long head of the biceps. It is possible that
a proximal rupture of the long head of the biceps tendon could cause
edema along the margins of the muscle and a protuberance of the
muscle belly as in this case. The tendon of the long head of the
biceps is very poorly seen proximally, but this may be due to
intrinsic limitations of CT and positioning rather than necessarily
being primary evidence of a proximal tear. Contrast, the margins of
the short head of the biceps appear relatively unremarkable an
without similar stranding along the margins.

Soft tissues

Unremarkable
IMPRESSION: 1. There are subtle findings of edema along the margins of the long
head of the biceps muscle proximally, along with some mild
prominence of the biceps muscle belly in the vicinity of the marker
without differential enhancement to suggest a true underlying mass.
The distal biceps tendon seems intact and the short head of the
biceps appears relatively unremarkable proximally. This leads me to
suspect that the patient may have had a rupture of the proximal long
head of the biceps in the vicinity of the shoulder, with the edema
signal along the proximal muscle belly being a response to the tear,
and the protuberance along the biceps likewise potentially relating
to bunching because of the torn tendon. Consider dedicated right
shoulder MRI with attention to the long head of the biceps.

## 2023-09-25 ENCOUNTER — Other Ambulatory Visit: Payer: Self-pay | Admitting: Family Medicine

## 2023-09-25 DIAGNOSIS — I10 Essential (primary) hypertension: Secondary | ICD-10-CM

## 2023-09-25 DIAGNOSIS — E785 Hyperlipidemia, unspecified: Secondary | ICD-10-CM

## 2023-10-14 ENCOUNTER — Inpatient Hospital Stay (HOSPITAL_BASED_OUTPATIENT_CLINIC_OR_DEPARTMENT_OTHER): Payer: Medicare Other | Admitting: Hematology & Oncology

## 2023-10-14 ENCOUNTER — Encounter: Payer: Self-pay | Admitting: Hematology & Oncology

## 2023-10-14 ENCOUNTER — Other Ambulatory Visit: Payer: Self-pay

## 2023-10-14 ENCOUNTER — Inpatient Hospital Stay: Payer: Medicare Other | Attending: Hematology & Oncology

## 2023-10-14 LAB — CMP (CANCER CENTER ONLY)
ALT: 11 U/L (ref 0–44)
AST: 19 U/L (ref 15–41)
Albumin: 4.2 g/dL (ref 3.5–5.0)
Alkaline Phosphatase: 58 U/L (ref 38–126)
Anion gap: 7 (ref 5–15)
BUN: 8 mg/dL (ref 8–23)
CO2: 35 mmol/L — ABNORMAL HIGH (ref 22–32)
Calcium: 9.9 mg/dL (ref 8.9–10.3)
Chloride: 100 mmol/L (ref 98–111)
Creatinine: 0.95 mg/dL (ref 0.61–1.24)
GFR, Estimated: >60 mL/min (ref >60)
Glucose, Bld: 112 mg/dL — ABNORMAL HIGH (ref 70–99)
Potassium: 3.8 mmol/L (ref 3.5–5.1)
Sodium: 142 mmol/L (ref 135–145)
Total Bilirubin: 0.6 mg/dL (ref <1.2)
Total Protein: 6.8 g/dL (ref 6.5–8.1)

## 2023-10-14 LAB — CBC WITH DIFFERENTIAL (CANCER CENTER ONLY)
Abs Immature Granulocytes: 0.02 10*3/uL (ref 0.00–0.07)
Basophils Absolute: 0.1 10*3/uL (ref 0.0–0.1)
Basophils Relative: 1 %
Eosinophils Absolute: 0.3 10*3/uL (ref 0.0–0.5)
Eosinophils Relative: 4 %
HCT: 40.4 % (ref 39.0–52.0)
Hemoglobin: 12.9 g/dL — ABNORMAL LOW (ref 13.0–17.0)
Immature Granulocytes: 0 %
Lymphocytes Relative: 26 %
Lymphs Abs: 1.6 10*3/uL (ref 0.7–4.0)
MCH: 27.2 pg (ref 26.0–34.0)
MCHC: 31.9 g/dL (ref 30.0–36.0)
MCV: 85.1 fL (ref 80.0–100.0)
Monocytes Absolute: 0.6 10*3/uL (ref 0.1–1.0)
Monocytes Relative: 10 %
Neutro Abs: 3.7 10*3/uL (ref 1.7–7.7)
Neutrophils Relative %: 59 %
Platelet Count: 216 10*3/uL (ref 150–400)
RBC: 4.75 MIL/uL (ref 4.22–5.81)
RDW: 13.6 % (ref 11.5–15.5)
WBC Count: 6.2 10*3/uL (ref 4.0–10.5)
nRBC: 0 % (ref 0.0–0.2)

## 2023-10-14 LAB — FERRITIN: Ferritin: 6 ng/mL — ABNORMAL LOW (ref 24–336)

## 2023-10-14 NOTE — Progress Notes (Signed)
Hematology and Oncology Follow Up Visit  Drew [name] [id number] [birth year] 67 y.o. 10/14/2023   Principle Diagnosis:  Hemochromatosis, heterozygous for the H63D mutation  Current Therapy:   Phlebotomy to maintain ferritin < 100 and iron saturation < 30% --patient donates blood to the [employer]    Interim History:  [name] is here today for follow-up.  He is doing quite well.  He is still working.  He works at [employer].  He is quite busy there and the [employer] department.  Over last saw him back in May, his iron studies look fantastic.  His iron studies showed a ferritin of 10 with an iron saturation of 10%.  He is donating blood.  He is doing a good job donating to the [employer].  He has had no problems with fatigue or weakness.  He has had no nausea or vomiting.  Has had no issues with COVID.  He has had no change in bowel or bladder habits.  There is been no rashes.  He has had no leg swelling.  Overall, I would have said that his performance status is probably ECOG 0.  Medications:  Allergies as of 10/14/2023   No Known Allergies      Medication List        Accurate as of October 14, 2023  5:01 PM. If you have any questions, ask your nurse or doctor.          aspirin EC 325 MG tablet Take 1 tablet (325 mg total) by mouth daily.   Fish Oil 1200 MG Caps Take 1,200 mg by mouth 2 (two) times daily.   lisinopril-hydrochlorothiazide 20-12.5 MG tablet Commonly known as: ZESTORETIC Take 2 tablets by mouth daily.   pravastatin 40 MG tablet Commonly known as: PRAVACHOL Take 1 tablet (40 mg total) by mouth daily.        Allergies: No Known Allergies  Past Medical History, Surgical history, Social history, and Family History were reviewed and updated.  Review of Systems: Review of Systems  Constitutional: Negative.   HENT: Negative.    Eyes: Negative.   Respiratory: Negative.    Cardiovascular: Negative.   Gastrointestinal: Negative.    Genitourinary: Negative.   Musculoskeletal:  Positive for myalgias.  Skin: Negative.   Neurological: Negative.   Endo/Heme/Allergies: Negative.   Psychiatric/Behavioral: Negative.       Physical Exam:  weight is 155 lb (70.3 kg). His oral temperature is 98.6 F (37 C). His blood pressure is 139/80 and his pulse is 67. His respiration is 18 and oxygen saturation is 99%.   Wt Readings from Last 3 Encounters:  10/14/23 155 lb (70.3 kg)  04/12/23 159 lb 1.9 oz (72.2 kg)  03/07/23 161 lb (73 kg)    Physical Exam Vitals reviewed.  HENT:     Head: Normocephalic and atraumatic.  Eyes:     Pupils: Pupils are equal, round, and reactive to light.  Cardiovascular:     Rate and Rhythm: Normal rate and regular rhythm.     Heart sounds: Normal heart sounds.  Pulmonary:     Effort: Pulmonary effort is normal.     Breath sounds: Normal breath sounds.  Abdominal:     General: Bowel sounds are normal.     Palpations: Abdomen is soft.  Musculoskeletal:        General: No tenderness or deformity. Normal range of motion.     Cervical back: Normal range of motion.  Lymphadenopathy:  Cervical: No cervical adenopathy.  Skin:    General: Skin is warm and dry.     Findings: No erythema or rash.  Neurological:     Mental Status: He is alert and oriented to person, place, and time.  Psychiatric:        Behavior: Behavior normal.        Thought Content: Thought content normal.        Judgment: Judgment normal.      Lab Results  Component Value Date   WBC 6.2 10/14/2023   HGB 12.9 (L) 10/14/2023   HCT 40.4 10/14/2023   MCV 85.1 10/14/2023   PLT 216 10/14/2023   Lab Results  Component Value Date   FERRITIN 10 (L) 04/12/2023   IRON 48 04/12/2023   TIBC 480 (H) 04/12/2023   UIBC 432 (H) 04/12/2023   IRONPCTSAT 10 (L) 04/12/2023   Lab Results  Component Value Date   RETICCTPCT 1.4 04/12/2023   RBC 4.75 10/14/2023   No results found for: "KPAFRELGTCHN", "LAMBDASER",  "KAPLAMBRATIO" No results found for: "IGGSERUM", "IGA", "IGMSERUM" No results found for: "TOTALPROTELP", "ALBUMINELP", "A1GS", "A2GS", "BETS", "BETA2SER", "GAMS", "MSPIKE", "SPEI"   Chemistry      Component Value Date/Time   NA 142 10/14/2023 1512   K 3.8 10/14/2023 1512   CL 100 10/14/2023 1512   CO2 35 (H) 10/14/2023 1512   BUN 8 10/14/2023 1512   CREATININE 0.95 10/14/2023 1512   CREATININE 0.98 03/07/2023 1538      Component Value Date/Time   CALCIUM 9.9 10/14/2023 1512   ALKPHOS 58 10/14/2023 1512   AST 19 10/14/2023 1512   ALT 11 10/14/2023 1512   BILITOT 0.6 10/14/2023 1512       Impression and Plan: [name] is a very pleasant 67 yo caucasian gentleman with hemochromatosis, heterzygous for the H63D mutation.   I cannot imagine that his iron studies are high.  As long as he keeps donating, he should be fine.  As always, we will get him back in 6 months.  Josph Macho, MD 11/4/20245:01 PM

## 2023-10-15 LAB — IRON AND IRON BINDING CAPACITY (CC-WL,HP ONLY)
Iron: 22 ug/dL — ABNORMAL LOW (ref 45–182)
Saturation Ratios: 4 % — ABNORMAL LOW (ref 17.9–39.5)
TIBC: 536 ug/dL — ABNORMAL HIGH (ref 250–450)
UIBC: 514 ug/dL — ABNORMAL HIGH (ref 117–376)

## 2023-10-20 ENCOUNTER — Other Ambulatory Visit: Payer: Self-pay | Admitting: Family Medicine

## 2023-10-20 DIAGNOSIS — I10 Essential (primary) hypertension: Secondary | ICD-10-CM

## 2023-10-20 DIAGNOSIS — E785 Hyperlipidemia, unspecified: Secondary | ICD-10-CM

## 2023-10-22 ENCOUNTER — Ambulatory Visit: Payer: Medicare Other | Admitting: Family Medicine

## 2023-10-29 ENCOUNTER — Ambulatory Visit: Payer: Medicare Other | Admitting: Family Medicine

## 2023-10-31 ENCOUNTER — Ambulatory Visit (INDEPENDENT_AMBULATORY_CARE_PROVIDER_SITE_OTHER): Payer: Medicare Other | Admitting: Family Medicine

## 2023-10-31 ENCOUNTER — Encounter: Payer: Self-pay | Admitting: Family Medicine

## 2023-10-31 VITALS — BP 120/70 | HR 68 | Temp 98.4°F | Resp 18 | Ht 66.0 in | Wt 156.0 lb

## 2023-10-31 DIAGNOSIS — E785 Hyperlipidemia, unspecified: Secondary | ICD-10-CM

## 2023-10-31 DIAGNOSIS — I10 Essential (primary) hypertension: Secondary | ICD-10-CM

## 2023-10-31 NOTE — Assessment & Plan Note (Signed)
Tolerating statin, encouraged heart healthy diet, avoid trans fats, minimize simple carbs and saturated fats. Increase exercise as tolerated  On pravastatin

## 2023-10-31 NOTE — Patient Instructions (Signed)
Hypertension, Adult High blood pressure (hypertension) is when the force of blood pumping through the arteries is too strong. The arteries are the blood vessels that carry blood from the heart throughout the body. Hypertension forces the heart to work harder to pump blood and may cause arteries to become narrow or stiff. Untreated or uncontrolled hypertension can lead to a heart attack, heart failure, a stroke, kidney disease, and other problems. A blood pressure reading consists of a higher number over a lower number. Ideally, your blood pressure should be below 120/80. The first ("top") number is called the systolic pressure. It is a measure of the pressure in your arteries as your heart beats. The second ("bottom") number is called the diastolic pressure. It is a measure of the pressure in your arteries as the heart relaxes. What are the causes? The exact cause of this condition is not known. There are some conditions that result in high blood pressure. What increases the risk? Certain factors may make you more likely to develop high blood pressure. Some of these risk factors are under your control, including: Smoking. Not getting enough exercise or physical activity. Being overweight. Having too much fat, sugar, calories, or salt (sodium) in your diet. Drinking too much alcohol. Other risk factors include: Having a personal history of heart disease, diabetes, high cholesterol, or kidney disease. Stress. Having a family history of high blood pressure and high cholesterol. Having obstructive sleep apnea. Age. The risk increases with age. What are the signs or symptoms? High blood pressure may not cause symptoms. Very high blood pressure (hypertensive crisis) may cause: Headache. Fast or irregular heartbeats (palpitations). Shortness of breath. Nosebleed. Nausea and vomiting. Vision changes. Severe chest pain, dizziness, and seizures. How is this diagnosed? This condition is diagnosed by  measuring your blood pressure while you are seated, with your arm resting on a flat surface, your legs uncrossed, and your feet flat on the floor. The cuff of the blood pressure monitor will be placed directly against the skin of your upper arm at the level of your heart. Blood pressure should be measured at least twice using the same arm. Certain conditions can cause a difference in blood pressure between your right and left arms. If you have a high blood pressure reading during one visit or you have normal blood pressure with other risk factors, you may be asked to: Return on a different day to have your blood pressure checked again. Monitor your blood pressure at home for 1 week or longer. If you are diagnosed with hypertension, you may have other blood or imaging tests to help your health care provider understand your overall risk for other conditions. How is this treated? This condition is treated by making healthy lifestyle changes, such as eating healthy foods, exercising more, and reducing your alcohol intake. You may be referred for counseling on a healthy diet and physical activity. Your health care provider may prescribe medicine if lifestyle changes are not enough to get your blood pressure under control and if: Your systolic blood pressure is above 130. Your diastolic blood pressure is above 80. Your personal target blood pressure may vary depending on your medical conditions, your age, and other factors. Follow these instructions at home: Eating and drinking  Eat a diet that is high in fiber and potassium, and low in sodium, added sugar, and fat. An example of this eating plan is called the DASH diet. DASH stands for Dietary Approaches to Stop Hypertension. To eat this way: Eat   plenty of fresh fruits and vegetables. Try to fill one half of your plate at each meal with fruits and vegetables. Eat whole grains, such as whole-wheat pasta, brown rice, or whole-grain bread. Fill about one  fourth of your plate with whole grains. Eat or drink low-fat dairy products, such as skim milk or low-fat yogurt. Avoid fatty cuts of meat, processed or cured meats, and poultry with skin. Fill about one fourth of your plate with lean proteins, such as fish, chicken without skin, beans, eggs, or tofu. Avoid pre-made and processed foods. These tend to be higher in sodium, added sugar, and fat. Reduce your daily sodium intake. Many people with hypertension should eat less than 1,500 mg of sodium a day. Do not drink alcohol if: Your health care provider tells you not to drink. You are pregnant, may be pregnant, or are planning to become pregnant. If you drink alcohol: Limit how much you have to: 0-1 drink a day for women. 0-2 drinks a day for men. Know how much alcohol is in your drink. In the [place] one drink equals one 12 oz bottle of beer (355 mL), one 5 oz glass of wine (148 mL), or one 1 oz glass of hard liquor (44 mL). Lifestyle  Work with your health care provider to maintain a healthy body weight or to lose weight. Ask what an ideal weight is for you. Get at least 30 minutes of exercise that causes your heart to beat faster (aerobic exercise) most days of the week. Activities may include walking, swimming, or biking. Include exercise to strengthen your muscles (resistance exercise), such as Pilates or lifting weights, as part of your weekly exercise routine. Try to do these types of exercises for 30 minutes at least 3 days a week. Do not use any products that contain nicotine or tobacco. These products include cigarettes, chewing tobacco, and vaping devices, such as e-cigarettes. If you need help quitting, ask your health care provider. Monitor your blood pressure at home as told by your health care provider. Keep all follow-up visits. This is important. Medicines Take over-the-counter and prescription medicines only as told by your health care provider. Follow directions carefully. Blood  pressure medicines must be taken as prescribed. Do not skip doses of blood pressure medicine. Doing this puts you at risk for problems and can make the medicine less effective. Ask your health care provider about side effects or reactions to medicines that you should watch for. Contact a health care provider if you: Think you are having a reaction to a medicine you are taking. Have headaches that keep coming back (recurring). Feel dizzy. Have swelling in your ankles. Have trouble with your vision. Get help right away if you: Develop a severe headache or confusion. Have unusual weakness or numbness. Feel faint. Have severe pain in your chest or abdomen. Vomit repeatedly. Have trouble breathing. These symptoms may be an emergency. Get help right away. Call 911. Do not wait to see if the symptoms will go away. Do not drive yourself to the hospital. Summary Hypertension is when the force of blood pumping through your arteries is too strong. If this condition is not controlled, it may put you at risk for serious complications. Your personal target blood pressure may vary depending on your medical conditions, your age, and other factors. For most people, a normal blood pressure is less than 120/80. Hypertension is treated with lifestyle changes, medicines, or a combination of both. Lifestyle changes include losing weight, eating a healthy,   low-sodium diet, exercising more, and limiting alcohol. This information is not intended to replace advice given to you by your health care provider. Make sure you discuss any questions you have with your health care provider. Document Revised: 10/03/2021 Document Reviewed: 10/03/2021 Elsevier Patient Education  2024 [employer] Inc.  

## 2023-10-31 NOTE — Assessment & Plan Note (Signed)
Well controlled, no changes to meds. Encouraged heart healthy diet such as the DASH diet and exercise as tolerated.  °

## 2023-10-31 NOTE — Progress Notes (Signed)
Established Patient Office Visit  Subjective   Patient ID: Drew [name], male    DOB: 1956/06/27  Age: 67 y.o. MRN: [id number]  Chief Complaint  Patient presents with   Hypertension   Hyperlipidemia   Follow-up    HPI Discussed the use of AI scribe software for clinical note transcription with the patient, who gave verbal consent to proceed.  History of Present Illness   The patient, with a history of hypertension and hyperlipidemia, presents for a routine follow-up visit. He reports no new health issues since the last visit. He has been adhering to his prescribed medications, including Pravastatin and Lisinopril, and recently had a refill. He denies any swelling in the ankles. The patient has been keeping busy with yard work, specifically leaf collection, which he describes as a workout. He has not received a flu shot and has previously discussed the shingles vaccine. He is due for a tetanus shot, which is particularly relevant given his frequent yard work. The patient is preparing for the holiday season, with plans to visit family.            Review of Systems  Constitutional:  Negative for chills, fever and malaise/fatigue.  HENT:  Negative for congestion and hearing loss.   Eyes:  Negative for blurred vision and discharge.  Respiratory:  Negative for cough, sputum production and shortness of breath.   Cardiovascular:  Negative for chest pain, palpitations and leg swelling.  Gastrointestinal:  Negative for abdominal pain, blood in stool, constipation, diarrhea, heartburn, nausea and vomiting.  Genitourinary:  Negative for dysuria, frequency, hematuria and urgency.  Musculoskeletal:  Negative for back pain, falls and myalgias.  Skin:  Negative for rash.  Neurological:  Negative for dizziness, sensory change, loss of consciousness, weakness and headaches.  Endo/Heme/Allergies:  Negative for environmental allergies. Does not bruise/bleed easily.  Psychiatric/Behavioral:   Negative for depression and suicidal ideas. The patient is not nervous/anxious and does not have insomnia.       Objective:     BP 120/70 (BP Location: Left Arm, Patient Position: Sitting, Cuff Size: Normal)   Pulse 68   Temp 98.4 F (36.9 C) (Oral)   Resp 18   Ht 5\' 6"  (1.676 m)   Wt 156 lb (70.8 kg)   SpO2 98%   BMI 25.18 kg/m  BP Readings from Last 3 Encounters:  10/31/23 120/70  10/14/23 139/80  04/12/23 (!) 149/78   Wt Readings from Last 3 Encounters:  10/31/23 156 lb (70.8 kg)  10/14/23 155 lb (70.3 kg)  04/12/23 159 lb 1.9 oz (72.2 kg)   SpO2 Readings from Last 3 Encounters:  10/31/23 98%  10/14/23 99%  04/12/23 98%      Physical Exam Vitals and nursing note reviewed.  Constitutional:      General: He is not in acute distress.    Appearance: Normal appearance. He is well-developed.  HENT:     Head: Normocephalic and atraumatic.     Right Ear: Tympanic membrane, ear canal and external ear normal. There is no impacted cerumen.     Left Ear: Tympanic membrane, ear canal and external ear normal. There is no impacted cerumen.     Nose: Nose normal.     Mouth/Throat:     Mouth: Mucous membranes are moist.     Pharynx: Oropharynx is clear. No oropharyngeal exudate or posterior oropharyngeal erythema.  Eyes:     General: No scleral icterus.       Right eye: No discharge.  Left eye: No discharge.     Conjunctiva/sclera: Conjunctivae normal.     Pupils: Pupils are equal, round, and reactive to light.  Neck:     Thyroid: No thyromegaly.     Vascular: No JVD.  Cardiovascular:     Rate and Rhythm: Normal rate and regular rhythm.     Heart sounds: Normal heart sounds. No murmur heard. Pulmonary:     Effort: Pulmonary effort is normal. No respiratory distress.     Breath sounds: Normal breath sounds.  Abdominal:     General: Bowel sounds are normal. There is no distension.     Palpations: Abdomen is soft. There is no mass.     Tenderness: There is no  abdominal tenderness. There is no guarding or rebound.  Musculoskeletal:        General: Normal range of motion.     Cervical back: Normal range of motion and neck supple.     Right lower leg: No edema.     Left lower leg: No edema.  Lymphadenopathy:     Cervical: No cervical adenopathy.  Skin:    General: Skin is warm and dry.     Findings: No erythema or rash.  Neurological:     Mental Status: He is alert and oriented to person, place, and time.     Cranial Nerves: No cranial nerve deficit.     Motor: No abnormal muscle tone.     Deep Tendon Reflexes: Reflexes are normal and symmetric. Reflexes normal.  Psychiatric:        Mood and Affect: Mood normal.        Behavior: Behavior normal.        Thought Content: Thought content normal.        Judgment: Judgment normal.      No results found for any visits on 10/31/23.     The ASCVD Risk score (Arnett DK, et al., 2019) failed to calculate for the following reasons:   The patient has a prior MI or stroke diagnosis    Assessment & Plan:   Problem List Items Addressed This Visit       Unprioritized   Hyperlipidemia LDL goal <100    Tolerating statin, encouraged heart healthy diet, avoid trans fats, minimize simple carbs and saturated fats. Increase exercise as tolerated  On pravastatin      Relevant Orders   CBC with Differential/Platelet   Comprehensive metabolic panel   Lipid panel   TSH   HTN (hypertension)    Well controlled, no changes to meds. Encouraged heart healthy diet such as the DASH diet and exercise as tolerated.        Other Visit Diagnoses     Essential hypertension    -  Primary   Relevant Orders   CBC with Differential/Platelet   Comprehensive metabolic panel   Lipid panel   TSH     Assessment and Plan    Hypertension   Well-controlled hypertension is evident, with no issues reported regarding the current medication regimen. Blood pressure remains within normal limits. We will continue  the current medications, pravastatin and lisinopril.  Medication Refill   He recently refilled medications, pravastatin and lisinopril, ensuring a sufficient supply for six months. No immediate action is required for medication refills.  General Health Maintenance   He has not received a flu shot. We discussed the importance of vaccinations, highlighting the flu and tetanus shots, especially considering his outdoor activities and potential exposure to tetanus. Part D covers  the cost. We recommend a flu shot and a tetanus shot, to be administered at the pharmacy.        Return in about 6 months (around 04/29/2024), or if symptoms worsen or fail to improve, for fasting, annual exam.    Donato Schultz, DO

## 2023-11-01 LAB — COMPREHENSIVE METABOLIC PANEL
ALT: 9 U/L (ref 0–53)
AST: 18 U/L (ref 0–37)
Albumin: 3.8 g/dL (ref 3.5–5.2)
Alkaline Phosphatase: 59 U/L (ref 39–117)
BUN: 9 mg/dL (ref 6–23)
CO2: 32 meq/L (ref 19–32)
Calcium: 9.1 mg/dL (ref 8.4–10.5)
Chloride: 101 meq/L (ref 96–112)
Creatinine, Ser: 0.93 mg/dL (ref 0.40–1.50)
GFR: 84.39 mL/min (ref >60.00)
Glucose, Bld: 76 mg/dL (ref 70–99)
Potassium: 3.5 meq/L (ref 3.5–5.1)
Sodium: 139 meq/L (ref 135–145)
Total Bilirubin: 0.6 mg/dL (ref 0.2–1.2)
Total Protein: 6.2 g/dL (ref 6.0–8.3)

## 2023-11-01 LAB — CBC WITH DIFFERENTIAL/PLATELET
Basophils Absolute: 0 10*3/uL (ref 0.0–0.1)
Basophils Relative: 0.3 % (ref 0.0–3.0)
Eosinophils Absolute: 0.4 10*3/uL (ref 0.0–0.7)
Eosinophils Relative: 6.1 % — ABNORMAL HIGH (ref 0.0–5.0)
HCT: 36.2 % — ABNORMAL LOW (ref 39.0–52.0)
Hemoglobin: 11.7 g/dL — ABNORMAL LOW (ref 13.0–17.0)
Lymphocytes Relative: 33.9 % (ref 12.0–46.0)
Lymphs Abs: 2 10*3/uL (ref 0.7–4.0)
MCHC: 32.3 g/dL (ref 30.0–36.0)
MCV: 84.1 fL (ref 78.0–100.0)
Monocytes Absolute: 0.6 10*3/uL (ref 0.1–1.0)
Monocytes Relative: 9.6 % (ref 3.0–12.0)
Neutro Abs: 2.9 10*3/uL (ref 1.4–7.7)
Neutrophils Relative %: 50.1 % (ref 43.0–77.0)
Platelets: 206 10*3/uL (ref 150.0–400.0)
RBC: 4.3 Mil/uL (ref 4.22–5.81)
RDW: 15.2 % (ref 11.5–15.5)
WBC: 5.8 10*3/uL (ref 4.0–10.5)

## 2023-11-01 LAB — TSH: TSH: 0.99 u[IU]/mL (ref 0.35–5.50)

## 2023-11-01 LAB — LIPID PANEL
Cholesterol: 134 mg/dL (ref 0–200)
HDL: 43.7 mg/dL (ref >39.00)
LDL Cholesterol: 77 mg/dL (ref 0–99)
NonHDL: 90.31
Total CHOL/HDL Ratio: 3
Triglycerides: 69 mg/dL (ref 0.0–149.0)
VLDL: 13.8 mg/dL (ref 0.0–40.0)

## 2024-01-20 ENCOUNTER — Other Ambulatory Visit: Payer: Self-pay | Admitting: Family Medicine

## 2024-01-20 DIAGNOSIS — E785 Hyperlipidemia, unspecified: Secondary | ICD-10-CM

## 2024-01-20 DIAGNOSIS — I10 Essential (primary) hypertension: Secondary | ICD-10-CM

## 2024-04-13 ENCOUNTER — Inpatient Hospital Stay: Payer: Medicare Other

## 2024-04-13 ENCOUNTER — Ambulatory Visit: Payer: Medicare Other | Admitting: Hematology & Oncology

## 2024-04-15 ENCOUNTER — Inpatient Hospital Stay (HOSPITAL_BASED_OUTPATIENT_CLINIC_OR_DEPARTMENT_OTHER): Payer: Medicare Other | Admitting: Hematology & Oncology

## 2024-04-15 ENCOUNTER — Encounter: Payer: Self-pay | Admitting: Hematology & Oncology

## 2024-04-15 ENCOUNTER — Inpatient Hospital Stay: Payer: Medicare Other | Attending: Hematology & Oncology

## 2024-04-15 LAB — CMP (CANCER CENTER ONLY)
ALT: 10 U/L (ref 0–44)
AST: 17 U/L (ref 15–41)
Albumin: 4.1 g/dL (ref 3.5–5.0)
Alkaline Phosphatase: 56 U/L (ref 38–126)
Anion gap: 6 (ref 5–15)
BUN: 11 mg/dL (ref 8–23)
CO2: 31 mmol/L (ref 22–32)
Calcium: 9.4 mg/dL (ref 8.9–10.3)
Chloride: 102 mmol/L (ref 98–111)
Creatinine: 1 mg/dL (ref 0.61–1.24)
GFR, Estimated: >60 mL/min (ref >60)
Glucose, Bld: 82 mg/dL (ref 70–99)
Potassium: 4 mmol/L (ref 3.5–5.1)
Sodium: 139 mmol/L (ref 135–145)
Total Bilirubin: 0.6 mg/dL (ref 0.0–1.2)
Total Protein: 6.5 g/dL (ref 6.5–8.1)

## 2024-04-15 LAB — CBC WITH DIFFERENTIAL (CANCER CENTER ONLY)
Abs Immature Granulocytes: 0 10*3/uL (ref 0.00–0.07)
Basophils Absolute: 0.1 10*3/uL (ref 0.0–0.1)
Basophils Relative: 2 %
Eosinophils Absolute: 0.3 10*3/uL (ref 0.0–0.5)
Eosinophils Relative: 5 %
HCT: 35.6 % — ABNORMAL LOW (ref 39.0–52.0)
Hemoglobin: 11.2 g/dL — ABNORMAL LOW (ref 13.0–17.0)
Immature Granulocytes: 0 %
Lymphocytes Relative: 33 %
Lymphs Abs: 2 10*3/uL (ref 0.7–4.0)
MCH: 24.7 pg — ABNORMAL LOW (ref 26.0–34.0)
MCHC: 31.5 g/dL (ref 30.0–36.0)
MCV: 78.6 fL — ABNORMAL LOW (ref 80.0–100.0)
Monocytes Absolute: 0.7 10*3/uL (ref 0.1–1.0)
Monocytes Relative: 11 %
Neutro Abs: 3.1 10*3/uL (ref 1.7–7.7)
Neutrophils Relative %: 49 %
Platelet Count: 242 10*3/uL (ref 150–400)
RBC: 4.53 MIL/uL (ref 4.22–5.81)
RDW: 15 % (ref 11.5–15.5)
WBC Count: 6.2 10*3/uL (ref 4.0–10.5)
nRBC: 0 % (ref 0.0–0.2)

## 2024-04-15 LAB — RETICULOCYTES
Immature Retic Fract: 15.3 % (ref 2.3–15.9)
RBC.: 4.54 MIL/uL (ref 4.22–5.81)
Retic Count, Absolute: 50.4 10*3/uL (ref 19.0–186.0)
Retic Ct Pct: 1.1 % (ref 0.4–3.1)

## 2024-04-15 LAB — FERRITIN: Ferritin: 10 ng/mL — ABNORMAL LOW (ref 24–336)

## 2024-04-15 NOTE — Progress Notes (Signed)
 Hematology and Oncology Follow Up Visit  MIKIE CAVENY [id number] October 28, 1956 68 y.o. 04/15/2024   Principle Diagnosis:  Hemochromatosis, heterozygous for the H63D mutation  Current Therapy:   Phlebotomy to maintain ferritin < 100 and iron saturation < 30% --patient donates blood to the [facility]    Interim History:  Mr. Salminen is here today for follow-up.  He is doing quite well.  He is still working.  He works at [employer].  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  We last saw him back in November.  His ferritin was 4 and iron saturation was 4%.  I told him that he really does not need to be donating blood right now.  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  However, I think his iron levels are low.  His hemoglobin is dropped a little bit.  I think he probably needs to have a little bit of a break so that he can build back of his blood.  He has had no fatigue or weakness.  He has had no bleeding.  Is had no change in bowel or bladder habits.  He has had no cough or shortness of breath.  He has had no rash.  There has been no leg swelling.  Overall, I would say his performance status is ECOG 0.   Medications:  Allergies as of 04/15/2024   No Known Allergies      Medication List        Accurate as of Apr 15, 2024  4:28 PM. If you have any questions, ask your nurse or doctor.          aspirin  EC 325 MG tablet Take 1 tablet (325 mg total) by mouth daily.   Fish Oil 1200 MG Caps Take 1,200 mg by mouth 2 (two) times daily.   lisinopril -hydrochlorothiazide  20-12.5 MG tablet Commonly known as: ZESTORETIC  TAKE 2 TABLETS BY MOUTH EVERY DAY   pravastatin  40 MG tablet Commonly known as: PRAVACHOL  TAKE 1 TABLET BY MOUTH EVERY DAY        Allergies: No Known Allergies  Past Medical History, Surgical history, Social history, and Family History were reviewed and updated.  Review of Systems: Review of Systems  Constitutional: Negative.   HENT:  Negative.    Eyes: Negative.   Respiratory: Negative.    Cardiovascular: Negative.   Gastrointestinal: Negative.   Genitourinary: Negative.   Musculoskeletal:  Positive for myalgias.  Skin: Negative.   Neurological: Negative.   Endo/Heme/Allergies: Negative.   Psychiatric/Behavioral: Negative.       Physical Exam:  height is 5\' 6"  (1.676 m) and weight is 156 lb 1.9 oz (70.8 kg). His oral temperature is 98.1 F (36.7 C). His blood pressure is 137/77 and his pulse is 69. His respiration is 20 and oxygen saturation is 97%.   Wt Readings from Last 3 Encounters:  04/15/24 156 lb 1.9 oz (70.8 kg)  10/31/23 156 lb (70.8 kg)  10/14/23 155 lb (70.3 kg)    Physical Exam Vitals reviewed.  HENT:     Head: Normocephalic and atraumatic.  Eyes:     Pupils: Pupils are equal, round, and reactive to light.  Cardiovascular:     Rate and Rhythm: Normal rate and regular rhythm.     Heart sounds: Normal heart sounds.  Pulmonary:     Effort: Pulmonary effort is normal.     Breath sounds: Normal breath sounds.  Abdominal:     General:  Bowel sounds are normal.     Palpations: Abdomen is soft.  Musculoskeletal:        General: No tenderness or deformity. Normal range of motion.     Cervical back: Normal range of motion.  Lymphadenopathy:     Cervical: No cervical adenopathy.  Skin:    General: Skin is warm and dry.     Findings: No erythema or rash.  Neurological:     Mental Status: He is alert and oriented to person, place, and time.  Psychiatric:        Behavior: Behavior normal.        Thought Content: Thought content normal.        Judgment: Judgment normal.      Lab Results  Component Value Date   WBC 6.2 04/15/2024   HGB 11.2 (L) 04/15/2024   HCT 35.6 (L) 04/15/2024   MCV 78.6 (L) 04/15/2024   PLT 242 04/15/2024   Lab Results  Component Value Date   FERRITIN 6 (L) 10/14/2023   IRON 22 (L) 10/14/2023   TIBC 536 (H) 10/14/2023   UIBC 514 (H) 10/14/2023   IRONPCTSAT 4  (L) 10/14/2023   Lab Results  Component Value Date   RETICCTPCT 1.1 04/15/2024   RBC 4.54 04/15/2024   No results found for: "KPAFRELGTCHN", "LAMBDASER", "KAPLAMBRATIO" No results found for: "IGGSERUM", "IGA", "IGMSERUM" No results found for: "TOTALPROTELP", "ALBUMINELP", "A1GS", "A2GS", "BETS", "BETA2SER", "GAMS", "MSPIKE", "SPEI"   Chemistry      Component Value Date/Time   NA 139 04/15/2024 1507   K 4.0 04/15/2024 1507   CL 102 04/15/2024 1507   CO2 31 04/15/2024 1507   BUN 11 04/15/2024 1507   CREATININE 1.00 04/15/2024 1507   CREATININE 0.98 03/07/2023 1538      Component Value Date/Time   CALCIUM  9.4 04/15/2024 1507   ALKPHOS 56 04/15/2024 1507   AST 17 04/15/2024 1507   ALT 10 04/15/2024 1507   BILITOT 0.6 04/15/2024 1507       Impression and Plan: Mr. Mccrary is a very pleasant 68 yo caucasian gentleman with hemochromatosis, heterzygous for the H63D mutation.   I cannot imagine that his iron studies are high.  I would have to think that his iron levels will be on the low side.  Again, I told him not to donate any blood until I see him back.  I would like to see him back in about 5 months or so.  I think this would be reasonable.   Ivor Mars, MD 5/7/20254:28 PM

## 2024-04-16 ENCOUNTER — Encounter: Payer: Self-pay | Admitting: *Deleted

## 2024-04-16 LAB — IRON AND IRON BINDING CAPACITY (CC-WL,HP ONLY)
Iron: 25 ug/dL — ABNORMAL LOW (ref 45–182)
Saturation Ratios: 5 % — ABNORMAL LOW (ref 17.9–39.5)
TIBC: 543 ug/dL — ABNORMAL HIGH (ref 250–450)
UIBC: 518 ug/dL — ABNORMAL HIGH (ref 117–376)

## 2024-09-23 ENCOUNTER — Encounter: Payer: Self-pay | Admitting: Hematology & Oncology

## 2024-09-23 ENCOUNTER — Inpatient Hospital Stay (HOSPITAL_BASED_OUTPATIENT_CLINIC_OR_DEPARTMENT_OTHER): Admitting: Hematology & Oncology

## 2024-09-23 ENCOUNTER — Inpatient Hospital Stay: Attending: Hematology & Oncology

## 2024-09-23 LAB — CBC WITH DIFFERENTIAL (CANCER CENTER ONLY)
Abs Immature Granulocytes: 0.01 K/uL (ref 0.00–0.07)
Basophils Absolute: 0.1 K/uL (ref 0.0–0.1)
Basophils Relative: 1 %
Eosinophils Absolute: 0.4 K/uL (ref 0.0–0.5)
Eosinophils Relative: 6 %
HCT: 39 % (ref 39.0–52.0)
Hemoglobin: 12.4 g/dL — ABNORMAL LOW (ref 13.0–17.0)
Immature Granulocytes: 0 %
Lymphocytes Relative: 32 %
Lymphs Abs: 2 K/uL (ref 0.7–4.0)
MCH: 25.9 pg — ABNORMAL LOW (ref 26.0–34.0)
MCHC: 31.8 g/dL (ref 30.0–36.0)
MCV: 81.6 fL (ref 80.0–100.0)
Monocytes Absolute: 0.5 K/uL (ref 0.1–1.0)
Monocytes Relative: 9 %
Neutro Abs: 3.3 K/uL (ref 1.7–7.7)
Neutrophils Relative %: 52 %
Platelet Count: 222 K/uL (ref 150–400)
RBC: 4.78 MIL/uL (ref 4.22–5.81)
RDW: 16.6 % — ABNORMAL HIGH (ref 11.5–15.5)
WBC Count: 6.3 K/uL (ref 4.0–10.5)
nRBC: 0 % (ref 0.0–0.2)

## 2024-09-23 LAB — IRON AND IRON BINDING CAPACITY (CC-WL,HP ONLY)
Iron: 41 ug/dL — ABNORMAL LOW (ref 45–182)
Saturation Ratios: 8 % — ABNORMAL LOW (ref 17.9–39.5)
TIBC: 536 ug/dL — ABNORMAL HIGH (ref 250–450)
UIBC: 495 ug/dL

## 2024-09-23 LAB — RETICULOCYTES
Immature Retic Fract: 8.8 % (ref 2.3–15.9)
RBC.: 4.72 MIL/uL (ref 4.22–5.81)
Retic Count, Absolute: 50 K/uL (ref 19.0–186.0)
Retic Ct Pct: 1.1 % (ref 0.4–3.1)

## 2024-09-23 LAB — CMP (CANCER CENTER ONLY)
ALT: 11 U/L (ref 0–44)
AST: 25 U/L (ref 15–41)
Albumin: 4 g/dL (ref 3.5–5.0)
Alkaline Phosphatase: 71 U/L (ref 38–126)
Anion gap: 9 (ref 5–15)
BUN: 8 mg/dL (ref 8–23)
CO2: 27 mmol/L (ref 22–32)
Calcium: 9.3 mg/dL (ref 8.9–10.3)
Chloride: 101 mmol/L (ref 98–111)
Creatinine: 0.9 mg/dL (ref 0.61–1.24)
GFR, Estimated: >60 mL/min (ref >60)
Glucose, Bld: 107 mg/dL — ABNORMAL HIGH (ref 70–99)
Potassium: 4.2 mmol/L (ref 3.5–5.1)
Sodium: 137 mmol/L (ref 135–145)
Total Bilirubin: 0.5 mg/dL (ref 0.0–1.2)
Total Protein: 6.5 g/dL (ref 6.5–8.1)

## 2024-09-23 LAB — FERRITIN: Ferritin: 15 ng/mL — ABNORMAL LOW (ref 24–336)

## 2024-09-23 NOTE — Progress Notes (Signed)
 Hematology and Oncology Follow Up Visit  AUSENCIO VADEN [id number] 08-Dec-1956 68 y.o. 09/23/2024   Principle Diagnosis:  Hemochromatosis, heterozygous for the H63D mutation  Current Therapy:   Phlebotomy to maintain ferritin < 100 and iron saturation < 30% --patient donates blood to the [facility]    Interim History:  Mr. Lakeman is here today for follow-up.  He is doing quite well.  He is still working.  He works at [employer].  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  We last saw him back in May.  At that time, his ferritin was 10 with an iron saturation of 5%.  He had been donating blood every 3 months.  I told him to cut back to every 4 months.  I think this would be very reasonable.  He is doing well.  He has had no problems since we last saw him.  He and his wife will be going on a trip to [place]  for their anniversary in a couple weeks.  He has had no problems with cough or shortness of breath.  He has had no headache.  He has had no change in bowel or bladder habits.  He has had no leg swelling.  He has had no rashes.  There has been no bleeding.  Overall, I would have to set his performance status is probably ECOG 1.  Medications:  Allergies as of 09/23/2024   No Known Allergies      Medication List        Accurate as of September 23, 2024  4:10 PM. If you have any questions, ask your nurse or doctor.          aspirin  EC 325 MG tablet Take 1 tablet (325 mg total) by mouth daily.   Fish Oil 1200 MG Caps Take 1,200 mg by mouth 2 (two) times daily.   lisinopril -hydrochlorothiazide  20-12.5 MG tablet Commonly known as: ZESTORETIC  TAKE 2 TABLETS BY MOUTH EVERY DAY   pravastatin  40 MG tablet Commonly known as: PRAVACHOL  TAKE 1 TABLET BY MOUTH EVERY DAY        Allergies: No Known Allergies  Past Medical History, Surgical history, Social history, and Family History were reviewed and updated.  Review of Systems: Review of Systems   Constitutional: Negative.   HENT: Negative.    Eyes: Negative.   Respiratory: Negative.    Cardiovascular: Negative.   Gastrointestinal: Negative.   Genitourinary: Negative.   Musculoskeletal:  Positive for myalgias.  Skin: Negative.   Neurological: Negative.   Endo/Heme/Allergies: Negative.   Psychiatric/Behavioral: Negative.       Physical Exam:  Vital signs show temperature of 98.  Pulse 72.  Blood pressure 124/72.  Weight is 160 pounds.  Wt Readings from Last 3 Encounters:  04/15/24 156 lb 1.9 oz (70.8 kg)  10/31/23 156 lb (70.8 kg)  10/14/23 155 lb (70.3 kg)    Physical Exam Vitals reviewed.  HENT:     Head: Normocephalic and atraumatic.  Eyes:     Pupils: Pupils are equal, round, and reactive to light.  Cardiovascular:     Rate and Rhythm: Normal rate and regular rhythm.     Heart sounds: Normal heart sounds.  Pulmonary:     Effort: Pulmonary effort is normal.     Breath sounds: Normal breath sounds.  Abdominal:     General: Bowel sounds are normal.     Palpations: Abdomen is soft.  Musculoskeletal:        General: No tenderness  or deformity. Normal range of motion.     Cervical back: Normal range of motion.  Lymphadenopathy:     Cervical: No cervical adenopathy.  Skin:    General: Skin is warm and dry.     Findings: No erythema or rash.  Neurological:     Mental Status: He is alert and oriented to person, place, and time.  Psychiatric:        Behavior: Behavior normal.        Thought Content: Thought content normal.        Judgment: Judgment normal.      Lab Results  Component Value Date   WBC 6.3 09/23/2024   HGB 12.4 (L) 09/23/2024   HCT 39.0 09/23/2024   MCV 81.6 09/23/2024   PLT 222 09/23/2024   Lab Results  Component Value Date   FERRITIN 10 (L) 04/15/2024   IRON 25 (L) 04/15/2024   TIBC 543 (H) 04/15/2024   UIBC 518 (H) 04/15/2024   IRONPCTSAT 5 (L) 04/15/2024   Lab Results  Component Value Date   RETICCTPCT 1.1 09/23/2024    RBC 4.78 09/23/2024   RBC 4.72 09/23/2024   No results found for: KPAFRELGTCHN, LAMBDASER, KAPLAMBRATIO No results found for: IGGSERUM, IGA, IGMSERUM No results found for: STEPHANY CARLOTA BENSON MARKEL EARLA JOANNIE DOC VICK, SPEI   Chemistry      Component Value Date/Time   NA 137 09/23/2024 1512   K 4.2 09/23/2024 1512   CL 101 09/23/2024 1512   CO2 27 09/23/2024 1512   BUN 8 09/23/2024 1512   CREATININE 0.90 09/23/2024 1512   CREATININE 0.98 03/07/2023 1538      Component Value Date/Time   CALCIUM  9.3 09/23/2024 1512   ALKPHOS 71 09/23/2024 1512   AST 25 09/23/2024 1512   ALT 11 09/23/2024 1512   BILITOT 0.5 09/23/2024 1512       Impression and Plan: Mr. Bynum is a very pleasant 68 yo caucasian gentleman with hemochromatosis, heterzygous for the H63D mutation.   Again, I cannot imagine that anything is can be a problem with his iron studies.  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  Again, I told him to do donations every 4 months.  Will plan to get him back in 6 months now.  Will get him through the [place] season and Winter.  I know that he will have a wonderful time in [place].  I told him to make sure he watches out for COVID.   Maude JONELLE Crease, MD 10/15/20254:10 PM

## 2024-09-24 ENCOUNTER — Ambulatory Visit: Payer: Self-pay | Admitting: Hematology & Oncology

## 2024-09-24 ENCOUNTER — Encounter: Payer: Self-pay | Admitting: *Deleted

## 2024-10-03 ENCOUNTER — Other Ambulatory Visit: Payer: Self-pay | Admitting: Family Medicine

## 2024-10-03 DIAGNOSIS — I10 Essential (primary) hypertension: Secondary | ICD-10-CM

## 2024-10-03 DIAGNOSIS — E785 Hyperlipidemia, unspecified: Secondary | ICD-10-CM

## 2024-10-31 ENCOUNTER — Telehealth: Payer: Self-pay | Admitting: Family Medicine

## 2024-10-31 DIAGNOSIS — E785 Hyperlipidemia, unspecified: Secondary | ICD-10-CM

## 2024-11-02 ENCOUNTER — Ambulatory Visit
Admission: EM | Admit: 2024-11-02 | Discharge: 2024-11-02 | Disposition: A | Discharge status: Home / Self Care | Attending: Nurse Practitioner | Admitting: Nurse Practitioner

## 2024-11-02 DIAGNOSIS — Z23 Encounter for immunization: Secondary | ICD-10-CM

## 2024-11-02 DIAGNOSIS — S81812A Laceration without foreign body, left lower leg, initial encounter: Secondary | ICD-10-CM | POA: Diagnosis not present

## 2024-11-02 MED ORDER — TETANUS-DIPHTH-ACELL PERTUSSIS 5-2-15.5 LF-MCG/0.5 IM SUSP
0.5000 mL | Freq: Once | INTRAMUSCULAR | Status: AC
  Administered 2024-11-02: 0.5 mL via INTRAMUSCULAR

## 2024-11-02 MED ORDER — CEPHALEXIN 500 MG PO CAPS: 500.0000 mg | ORAL_CAPSULE | Freq: Three times a day (TID) | ORAL | 0 refills | Status: AC

## 2024-11-02 NOTE — ED Provider Notes (Signed)
 [facility]    CSN: [id number] Arrival date & time: 11/02/24  1433      History   Chief Complaint Chief Complaint  Patient presents with   Laceration    HPI Drew [name] is a 68 y.o. male.   Patient presents for evaluation of a laceration he sustained to his left lower extremity just prior to arrival.  He was operating a circular saw when it slipped, impacting the inside of his left upper knee region.  Bleeding was controlled upon arrival.  No loss of range of motion or neurosensory function to his left lower extremity.  Last tetanus shot was noted 2013.  No medication taken prior to arrival.  The history is provided by the patient.  Laceration Associated symptoms: no fever     Past Medical History:  Diagnosis Date   CVA (cerebral vascular accident) (HCC)    Hemochromatosis, hereditary 08/28/2021   High cholesterol    Hypertension    Memory loss    Tubular adenoma of colon     Patient Active Problem List   Diagnosis Date Noted   Hemochromatosis, hereditary 08/28/2021   Dehydration 10/08/2020   Elevated hemoglobin 10/08/2020   Vertigo 10/08/2020   Metabolic acidosis 10/08/2020   Preventative health care 04/22/2020   Ischemic stroke (HCC) 03/18/2017   Hyperlipidemia LDL goal <100 07/19/2016   HTN (hypertension) 01/18/2014    Past Surgical History:  Procedure Laterality Date   COLONOSCOPY     HERNIA REPAIR Left 2004   POLYPECTOMY         Home Medications    Prior to Admission medications   Medication Sig Start Date End Date Taking? Authorizing Provider  aspirin  EC 325 MG tablet Take 1 tablet (325 mg total) by mouth daily. 03/21/17  Yes Hongalgi, Anand D, MD  cephALEXin  (KEFLEX ) 500 MG capsule Take 1 capsule (500 mg total) by mouth 3 (three) times daily for 7 days. 11/02/24 11/09/24 Yes Janet Therisa JINNY, FNP  lisinopril -hydrochlorothiazide  (ZESTORETIC ) 20-12.5 MG tablet Take 2 tablets by mouth daily. Needs appt 10/05/24  Yes Antonio Cyndee Rockers R,  DO  Omega-3 Fatty Acids (FISH OIL) 1200 MG CAPS Take 1,200 mg by mouth 2 (two) times daily.    Yes [provider]  pravastatin  (PRAVACHOL ) 40 MG tablet Take 1 tablet (40 mg total) by mouth daily. Needs appt 10/05/24  Yes Antonio Cyndee Rockers JONELLE, DO    Family History Family History  Problem Relation Age of Onset   Heart disease Mother        claudette Gavel' disease Mother    Heart disease Father    Breast cancer Sister    Hyperlipidemia Brother    Hypertension Brother    Hypertension Brother    Heart disease Paternal Uncle    Heart disease Paternal Uncle    Heart disease Paternal Uncle    Heart disease Paternal Uncle    Heart disease Paternal Uncle    Heart disease Paternal Uncle    Colon cancer Neg Hx    Colon polyps Neg Hx    Esophageal cancer Neg Hx    Rectal cancer Neg Hx    Stomach cancer Neg Hx     Social History Social History   Tobacco Use   Smoking status: Former    Current packs/day: 0.00    Average packs/day: 1 pack/day for 15.0 years (15.0 ttl pk-yrs)    Types: Cigarettes    Start date: 01/18/1973    Quit date: 01/19/1988  Years since quitting: 36.8   Smokeless tobacco: Current    Types: Chew  Vaping Use   Vaping status: Never Used  Substance Use Topics   Alcohol use: No   Drug use: No     Allergies   Patient has no known allergies.   Review of Systems Review of Systems  Constitutional:  Negative for appetite change and fever.  HENT:  Negative for congestion, rhinorrhea and sore throat.   Eyes:  Negative for pain and redness.  Respiratory:  Negative for cough and shortness of breath.   Cardiovascular:  Negative for chest pain.  Gastrointestinal:  Negative for abdominal pain, diarrhea and vomiting.  Genitourinary:  Negative for dysuria.  Musculoskeletal:  Negative for back pain and neck pain.  Skin:  Positive for wound (Laceration to left lower extremity).  Neurological:  Negative for dizziness and headaches.  Psychiatric/Behavioral:   Negative for sleep disturbance.      Physical Exam Triage Vital Signs ED Triage Vitals  Encounter Vitals Group     BP 11/02/24 1441 122/80     Girls Systolic BP Percentile --      Girls Diastolic BP Percentile --      Boys Systolic BP Percentile --      Boys Diastolic BP Percentile --      Pulse Rate 11/02/24 1441 80     Resp 11/02/24 1441 15     Temp 11/02/24 1441 98.2 F (36.8 C)     Temp Source 11/02/24 1441 Oral     SpO2 11/02/24 1441 96 %     Weight --      Height --      Head Circumference --      Peak Flow --      Pain Score 11/02/24 1440 3     Pain Loc --      Pain Education --      Exclude from Growth Chart --    No data found.  Updated Vital Signs BP 122/80 (BP Location: Left Arm)   Pulse 80   Temp 98.2 F (36.8 C) (Oral)   Resp 15   SpO2 96%   Visual Acuity Right Eye Distance:   Left Eye Distance:   Bilateral Distance:    Right Eye Near:   Left Eye Near:    Bilateral Near:     Physical Exam Vitals and nursing note reviewed.  Constitutional:      Appearance: Normal appearance.  Cardiovascular:     Rate and Rhythm: Normal rate and regular rhythm.     Heart sounds: Normal heart sounds.  Pulmonary:     Effort: Pulmonary effort is normal.     Breath sounds: Normal breath sounds. No wheezing, rhonchi or rales.  Abdominal:     General: Bowel sounds are normal.  Skin:    General: Skin is warm and dry.     Comments: Documentation by exception: Left lower extremity: There is a slightly jagged partial-thickness laceration to the medial aspect of the leg just above the knee joint.  Does not appear to involve the joint capsule.  He maintains full range of motion with flexion and extension of his knee.  Neurovascular status is intact distally.  No active bleeding.  There was a linear piece of clothing embedded in the wound (removed). Measures approximately 7 cm.  Neurological:     General: No focal deficit present.     Mental Status: He is alert and  oriented to person, place, and time.  Psychiatric:  Mood and Affect: Mood normal.        Behavior: Behavior normal.        Thought Content: Thought content normal.        Judgment: Judgment normal.      UC Treatments / Results  Labs (all labs ordered are listed, but only abnormal results are displayed) Labs Reviewed - No data to display  EKG   Radiology No results found.  Procedures Laceration Repair  Date/Time: 11/02/2024 4:55 PM  Performed by: Janet Therisa PARAS, FNP Authorized by: Janet Therisa PARAS, FNP   Consent:    Consent obtained:  Verbal   Consent given by:  Patient Universal protocol:    Procedure explained and questions answered to patient or proxy's satisfaction: yes   Anesthesia:    Anesthesia method:  Local infiltration   Local anesthetic:  Lidocaine 2% WITH epi Laceration details:    Location:  Leg   Leg location:  L knee   Wound length (cm): Approximately 7 cm. Pre-procedure details:    Preparation:  Patient was prepped and draped in usual sterile fashion Exploration:    Wound exploration: entire depth of wound visualized     Wound extent: foreign bodies/material     Foreign bodies/material:  Linear piece of fabric, presumed to be from the pants he was wearing when the laceration occured. Treatment:    Area cleansed with:  Povidone-iodine, chlorhexidine and saline (Copious irrigation with both Hibiclens, saline, and betadine)   Amount of cleaning:  Extensive   Irrigation method:  Syringe   Visualized foreign bodies/material removed: yes     Debridement:  None Skin repair:    Repair method:  Sutures   Suture size:  4-0   Suture material:  Prolene   Suture technique:  Simple interrupted   Number of sutures:  9 Approximation:    Approximation:  Close Repair type:    Repair type:  Simple Post-procedure details:    Dressing:  Antibiotic ointment and adhesive bandage   Procedure completion:  Tolerated well, no immediate complications Comments:      Betadine wash/dry pre and post procedure.  Wound hemostatic upon procedure completion.  (including critical care time)  Medications Ordered in UC Medications  Tdap (ADACEL ) injection 0.5 mL (0.5 mLs Intramuscular Given 11/02/24 1655)    Initial Impression / Assessment and Plan / UC Course  I have reviewed the triage vital signs and the nursing notes.  Pertinent labs & imaging results that were available during my care of the patient were reviewed by me and considered in my medical decision making (see chart for details).    Patient presented for evaluation and repair of a laceration sustained to his left medial lower extremity (adjacent to the area just above the knee).  Did not appear to involve the actual knee joint/capsule itself.  Wound was thoroughly explored, irrigated, and any foreign bodies (fabric fragments) were removed.  Successfully approximated with 9 sutures of 4.0 Prolene.  Betadine wash and dry pre and postprocedure.  Wound care was reviewed in depth and provided in written instructions.  Due to the proximity of the laceration to the knee and mechanism of injury-I do feel it is reasonable to provide post laceration repair antibiotic prophylaxis.  Cephalexin  3 times daily x 7 days was prescribed.  I have recommended that they return in 14 days for suture removal.  Red flags were reviewed which would warrant return evaluation.  Tdap was updated at today's visit.  Final Clinical Impressions(s) / UC  Diagnoses   Final diagnoses:  Laceration of left lower extremity, initial encounter  Need for prophylactic vaccination with combined diphtheria-tetanus-pertussis (DTP) vaccine     Discharge Instructions      Clean laceration twice daily with Hibiclens soap.  Pat dry. May shower but don't soak in water Antibiotic ointment today and tomorrow, then just keep dry and clean Open to air at home, cover when in public Antibiotic Keflex  three times daily x 7 days due to how the  laceration occurred and proximity to your knee Return for re-evaluation if the laceration becomes red, swollen, hot to touch, or discharge noted. May walk normally, avoid heavy lifting or strenuous activity Return in 14 days for suture removal. Tylenol /Motrin for any residual discomfort     ED Prescriptions     Medication Sig Dispense Auth. Provider   cephALEXin  (KEFLEX ) 500 MG capsule Take 1 capsule (500 mg total) by mouth 3 (three) times daily for 7 days. 21 capsule Janet Therisa PARAS, FNP      PDMP not reviewed this encounter.   Janet Therisa PARAS, FNP 11/02/24 1700

## 2024-11-02 NOTE — Telephone Encounter (Signed)
 Drew [name]- Pt is overdue for an appt. Can you try reaching out to him please? Thank you.

## 2024-11-02 NOTE — ED Triage Notes (Signed)
 Pt present with a laceration to the lt upper extremity of leg. Pt states about 30 minutes ago he cut his leg with a circular saw. States he is not up to date with his tetanus vaccine.

## 2024-11-02 NOTE — Discharge Instructions (Addendum)
 Clean laceration twice daily with Hibiclens soap.  Pat dry. May shower but don't soak in water Antibiotic ointment today and tomorrow, then just keep dry and clean Open to air at home, cover when in public Antibiotic Keflex  three times daily x 7 days due to how the laceration occurred and proximity to your knee Return for re-evaluation if the laceration becomes red, swollen, hot to touch, or discharge noted. May walk normally, avoid heavy lifting or strenuous activity Return in 14 days for suture removal. Tylenol /Motrin for any residual discomfort

## 2024-11-02 NOTE — Telephone Encounter (Signed)
 Lvm to sched

## 2024-11-04 ENCOUNTER — Other Ambulatory Visit: Payer: Self-pay | Admitting: Family Medicine

## 2024-11-04 DIAGNOSIS — I10 Essential (primary) hypertension: Secondary | ICD-10-CM

## 2024-11-04 DIAGNOSIS — E785 Hyperlipidemia, unspecified: Secondary | ICD-10-CM

## 2024-11-16 ENCOUNTER — Ambulatory Visit

## 2024-12-08 ENCOUNTER — Encounter: Payer: Self-pay | Admitting: Family Medicine

## 2024-12-08 ENCOUNTER — Ambulatory Visit (INDEPENDENT_AMBULATORY_CARE_PROVIDER_SITE_OTHER): Admitting: Family Medicine

## 2024-12-08 VITALS — BP 112/68 | HR 62 | Temp 97.5°F | Resp 16 | Ht 66.0 in | Wt 154.4 lb

## 2024-12-08 DIAGNOSIS — E785 Hyperlipidemia, unspecified: Secondary | ICD-10-CM

## 2024-12-08 DIAGNOSIS — I10 Essential (primary) hypertension: Secondary | ICD-10-CM

## 2024-12-08 LAB — COMPREHENSIVE METABOLIC PANEL WITH GFR
ALT: 10 U/L (ref 3–53)
AST: 17 U/L (ref 5–37)
Albumin: 4 g/dL (ref 3.5–5.2)
Alkaline Phosphatase: 67 U/L (ref 39–117)
BUN: 9 mg/dL (ref 6–23)
CO2: 32 meq/L (ref 19–32)
Calcium: 9.5 mg/dL (ref 8.4–10.5)
Chloride: 101 meq/L (ref 96–112)
Creatinine, Ser: 0.93 mg/dL (ref 0.40–1.50)
GFR: 83.74 mL/min
Glucose, Bld: 85 mg/dL (ref 70–99)
Potassium: 4 meq/L (ref 3.5–5.1)
Sodium: 140 meq/L (ref 135–145)
Total Bilirubin: 0.5 mg/dL (ref 0.2–1.2)
Total Protein: 6.8 g/dL (ref 6.0–8.3)

## 2024-12-08 LAB — LIPID PANEL
Cholesterol: 172 mg/dL (ref 28–200)
HDL: 49.8 mg/dL
LDL Cholesterol: 104 mg/dL — ABNORMAL HIGH (ref 10–99)
NonHDL: 121.73
Total CHOL/HDL Ratio: 3
Triglycerides: 91 mg/dL (ref 10.0–149.0)
VLDL: 18.2 mg/dL (ref 0.0–40.0)

## 2024-12-08 LAB — CBC WITH DIFFERENTIAL/PLATELET
Basophils Absolute: 0.1 K/uL (ref 0.0–0.1)
Basophils Relative: 1.5 % (ref 0.0–3.0)
Eosinophils Absolute: 0.3 K/uL (ref 0.0–0.7)
Eosinophils Relative: 5.3 % — ABNORMAL HIGH (ref 0.0–5.0)
HCT: 39.6 % (ref 39.0–52.0)
Hemoglobin: 12.9 g/dL — ABNORMAL LOW (ref 13.0–17.0)
Lymphocytes Relative: 23.8 % (ref 12.0–46.0)
Lymphs Abs: 1.4 K/uL (ref 0.7–4.0)
MCHC: 32.5 g/dL (ref 30.0–36.0)
MCV: 84.6 fl (ref 78.0–100.0)
Monocytes Absolute: 0.5 K/uL (ref 0.1–1.0)
Monocytes Relative: 8.9 % (ref 3.0–12.0)
Neutro Abs: 3.5 K/uL (ref 1.4–7.7)
Neutrophils Relative %: 60.5 % (ref 43.0–77.0)
Platelets: 222 K/uL (ref 150.0–400.0)
RBC: 4.68 Mil/uL (ref 4.22–5.81)
RDW: 15.2 % (ref 11.5–15.5)
WBC: 5.8 K/uL (ref 4.0–10.5)

## 2024-12-08 LAB — TSH: TSH: 0.93 u[IU]/mL (ref 0.35–5.50)

## 2024-12-08 MED ORDER — PRAVASTATIN SODIUM 40 MG PO TABS: 40.0000 mg | ORAL_TABLET | Freq: Every day | ORAL | 1 refills | Status: AC

## 2024-12-08 MED ORDER — LISINOPRIL-HYDROCHLOROTHIAZIDE 20-12.5 MG PO TABS: 2.0000 | ORAL_TABLET | Freq: Every day | ORAL | 1 refills | Status: AC

## 2024-12-08 NOTE — Progress Notes (Signed)
 "  Subjective:    Patient ID: Drew [name], male    DOB: 10/03/1956, 68 y.o.   MRN: [id number]  Chief Complaint  Patient presents with   Hypertension   Hyperlipidemia   Follow-up    HPI Patient is in today for f/u bp and cholesterol.  Discussed the use of AI scribe software for clinical note transcription with the patient, who gave verbal consent to proceed.  History of Present Illness Drew [name] is a 68 year old male who presents for a routine follow-up visit.  He recently had his prescriptions for pravastatin  refilled for a 90-day supply.  No swelling in his legs. He has no questions or concerns at this time.  He spent Christmas with his children and grandchildren.    Past Medical History:  Diagnosis Date   CVA (cerebral vascular accident) (HCC)    Hemochromatosis, hereditary 08/28/2021   High cholesterol    Hypertension    Memory loss    Tubular adenoma of colon     Past Surgical History:  Procedure Laterality Date   COLONOSCOPY     HERNIA REPAIR Left 2004   POLYPECTOMY      Family History  Problem Relation Age of Onset   Heart disease Mother        claudette Gavel' disease Mother    Heart disease Father    Breast cancer Sister    Hyperlipidemia Brother    Hypertension Brother    Hypertension Brother    Heart disease Paternal Uncle    Heart disease Paternal Uncle    Heart disease Paternal Uncle    Heart disease Paternal Uncle    Heart disease Paternal Uncle    Heart disease Paternal Uncle    Colon cancer Neg Hx    Colon polyps Neg Hx    Esophageal cancer Neg Hx    Rectal cancer Neg Hx    Stomach cancer Neg Hx     Social History   Socioeconomic History   Marital status: Married    Spouse name: Not on file   Number of children: Not on file   Years of education: Not on file   Highest education level: 12th grade  Occupational History   Not on file  Tobacco Use   Smoking status: Former    Current packs/day: 0.00    Average packs/day: 1  pack/day for 15.0 years (15.0 ttl pk-yrs)    Types: Cigarettes    Start date: 01/18/1973    Quit date: 01/19/1988    Years since quitting: 36.9   Smokeless tobacco: Current    Types: Chew  Vaping Use   Vaping status: Never Used  Substance and Sexual Activity   Alcohol use: No   Drug use: No   Sexual activity: Yes  Other Topics Concern   Not on file  Social History Narrative   Exercise-walking   Social Drivers of Health   Tobacco Use: High Risk (12/08/2024)   Patient History    Smoking Tobacco Use: Former    Smokeless Tobacco Use: Current    Passive Exposure: Not on Actuary Strain: Low Risk (01/09/2023)   Overall Financial Resource Strain (CARDIA)    Difficulty of Paying Living Expenses: Not hard at all  Food Insecurity: No Food Insecurity (03/06/2023)   Hunger Vital Sign    Worried About Running Out of Food in the Last Year: Never true    Ran Out of Food in the Last Year: Never true  Transportation Needs: No Transportation Needs (03/06/2023)   PRAPARE - Administrator, Civil Service (Medical): No    Lack of Transportation (Non-Medical): No  Physical Activity: Sufficiently Active (03/06/2023)   Exercise Vital Sign    Days of Exercise per Week: 7 days    Minutes of Exercise per Session: 30 min  Stress: No Stress Concern Present (03/06/2023)   [employer] of Occupational Health - Occupational Stress Questionnaire    Feeling of Stress : Not at all  Social Connections: Unknown (03/06/2023)   Social Connection and Isolation Panel    Frequency of Communication with Friends and Family: More than three times a week    Frequency of Social Gatherings with Friends and Family: More than three times a week    Attends Religious Services: More than 4 times per year    Active Member of [employer] or Organizations: Not on file    Attends Banker Meetings: Not on file    Marital Status: Married  Intimate Partner Violence: Not At Risk (01/09/2023)    Humiliation, Afraid, Rape, and Kick questionnaire    Fear of Current or Ex-Partner: No    Emotionally Abused: No    Physically Abused: No    Sexually Abused: No  Depression (PHQ2-9): Low Risk (09/23/2024)   Depression (PHQ2-9)    PHQ-2 Score: 0  Alcohol Screen: Not on file  Housing: Low Risk (03/06/2023)   Housing    Last Housing Risk Score: 0  Utilities: Not At Risk (01/09/2023)   [facility] Utilities    Threatened with loss of utilities: No  Health Literacy: Not on file    Outpatient Medications Prior to Visit  Medication Sig Dispense Refill   aspirin  EC 325 MG tablet Take 1 tablet (325 mg total) by mouth daily. 30 tablet 0   Omega-3 Fatty Acids (FISH OIL) 1200 MG CAPS Take 1,200 mg by mouth 2 (two) times daily.      lisinopril -hydrochlorothiazide  (ZESTORETIC ) 20-12.5 MG tablet Take 2 tablets by mouth daily. 180 tablet 0   pravastatin  (PRAVACHOL ) 40 MG tablet Take 1 tablet (40 mg total) by mouth daily. 90 tablet 0   No facility-administered medications prior to visit.    Allergies[1]  Review of Systems  Constitutional:  Negative for fever and malaise/fatigue.  HENT:  Negative for congestion.   Eyes:  Negative for blurred vision.  Respiratory:  Negative for shortness of breath.   Cardiovascular:  Negative for chest pain, palpitations and leg swelling.  Gastrointestinal:  Negative for abdominal pain, blood in stool and nausea.  Genitourinary:  Negative for dysuria and frequency.  Musculoskeletal:  Negative for falls.  Skin:  Negative for rash.  Neurological:  Negative for dizziness, loss of consciousness and headaches.  Endo/Heme/Allergies:  Negative for environmental allergies.  Psychiatric/Behavioral:  Negative for depression. The patient is not nervous/anxious.        Objective:    Physical Exam Vitals and nursing note reviewed.  Constitutional:      General: He is not in acute distress.    Appearance: Normal appearance. He is well-developed.  HENT:     Head:  Normocephalic and atraumatic.  Neck:     Thyroid : No thyromegaly.     Vascular: No JVD.  Cardiovascular:     Rate and Rhythm: Normal rate and regular rhythm.     Heart sounds: Normal heart sounds. No murmur heard. Pulmonary:     Effort: Pulmonary effort is normal. No respiratory distress.     Breath sounds:  Normal breath sounds.  Musculoskeletal:     Cervical back: Normal range of motion and neck supple.     Right lower leg: No edema.     Left lower leg: No edema.  Lymphadenopathy:     Cervical: No cervical adenopathy.  Skin:    General: Skin is warm and dry.     Findings: No erythema or rash.  Neurological:     Mental Status: He is alert and oriented to person, place, and time.     Cranial Nerves: No cranial nerve deficit.     Motor: No abnormal muscle tone.     Deep Tendon Reflexes: Reflexes are normal and symmetric. Reflexes normal.  Psychiatric:        Mood and Affect: Mood normal.        Behavior: Behavior normal.        Thought Content: Thought content normal.        Judgment: Judgment normal.     BP 112/68 (BP Location: Left Arm, Patient Position: Sitting)   Pulse 62   Temp (!) 97.5 F (36.4 C) (Oral)   Resp 16   Ht 5' 6 (1.676 m)   Wt 154 lb 6.4 oz (70 kg)   SpO2 100%   BMI 24.92 kg/m  Wt Readings from Last 3 Encounters:  12/08/24 154 lb 6.4 oz (70 kg)  04/15/24 156 lb 1.9 oz (70.8 kg)  10/31/23 156 lb (70.8 kg)    Diabetic Foot Exam - Simple   No data filed    Lab Results  Component Value Date   WBC 6.3 09/23/2024   HGB 12.4 (L) 09/23/2024   HCT 39.0 09/23/2024   PLT 222 09/23/2024   GLUCOSE 107 (H) 09/23/2024   CHOL 134 10/31/2023   TRIG 69.0 10/31/2023   HDL 43.70 10/31/2023   LDLCALC 77 10/31/2023   ALT 11 09/23/2024   AST 25 09/23/2024   NA 137 09/23/2024   K 4.2 09/23/2024   CL 101 09/23/2024   CREATININE 0.90 09/23/2024   BUN 8 09/23/2024   CO2 27 09/23/2024   TSH 0.99 10/31/2023   PSA 2.41 03/07/2023   HGBA1C 5.4 10/06/2020     Lab Results  Component Value Date   TSH 0.99 10/31/2023   Lab Results  Component Value Date   WBC 6.3 09/23/2024   HGB 12.4 (L) 09/23/2024   HCT 39.0 09/23/2024   MCV 81.6 09/23/2024   PLT 222 09/23/2024   Lab Results  Component Value Date   NA 137 09/23/2024   K 4.2 09/23/2024   CO2 27 09/23/2024   GLUCOSE 107 (H) 09/23/2024   BUN 8 09/23/2024   CREATININE 0.90 09/23/2024   BILITOT 0.5 09/23/2024   ALKPHOS 71 09/23/2024   AST 25 09/23/2024   ALT 11 09/23/2024   PROT 6.5 09/23/2024   ALBUMIN 4.0 09/23/2024   CALCIUM  9.3 09/23/2024   ANIONGAP 9 09/23/2024   GFR 84.39 10/31/2023   Lab Results  Component Value Date   CHOL 134 10/31/2023   Lab Results  Component Value Date   HDL 43.70 10/31/2023   Lab Results  Component Value Date   LDLCALC 77 10/31/2023   Lab Results  Component Value Date   TRIG 69.0 10/31/2023   Lab Results  Component Value Date   CHOLHDL 3 10/31/2023   Lab Results  Component Value Date   HGBA1C 5.4 10/06/2020       Assessment & Plan:  Primary hypertension Assessment & Plan: Well controlled, no  changes to meds. Encouraged heart healthy diet such as the DASH diet and exercise as tolerated.     Essential hypertension -     Lisinopril -hydroCHLOROthiazide ; Take 2 tablets by mouth daily.  Dispense: 180 tablet; Refill: 1 -     CBC with Differential/Platelet -     Comprehensive metabolic panel with GFR -     Lipid panel -     TSH  Hyperlipidemia LDL goal <100 Assessment & Plan: Tolerating statin, encouraged heart healthy diet, avoid trans fats, minimize simple carbs and saturated fats. Increase exercise as tolerated   Orders: -     Pravastatin  Sodium; Take 1 tablet (40 mg total) by mouth daily.  Dispense: 90 tablet; Refill: 1 -     Comprehensive metabolic panel with GFR -     Lipid panel   Assessment and Plan Assessment & Plan Essential hypertension   Blood pressure is well-controlled with current  management.  Hyperlipidemia   Currently managed with pravastatin . Continue pravastatin  as prescribed.  General health maintenance   He declined pneumonia and shingles vaccinations and flu vaccine.        [1] No Known Allergies  "

## 2024-12-08 NOTE — Patient Instructions (Signed)

## 2024-12-08 NOTE — Assessment & Plan Note (Signed)
 Tolerating statin, encouraged heart healthy diet, avoid trans fats, minimize simple carbs and saturated fats. Increase exercise as tolerated

## 2024-12-08 NOTE — Assessment & Plan Note (Signed)
 Well controlled, no changes to meds. Encouraged heart healthy diet such as the DASH diet and exercise as tolerated.

## 2024-12-10 ENCOUNTER — Ambulatory Visit: Payer: Self-pay | Admitting: Family Medicine

## 2025-03-24 ENCOUNTER — Inpatient Hospital Stay: Admitting: Hematology & Oncology

## 2025-03-24 ENCOUNTER — Inpatient Hospital Stay
